# Patient Record
Sex: Male | Born: 1955 | Race: White | Hispanic: No | Marital: Married | Smoking: Current some day smoker
Health system: Southern US, Community
[De-identification: ages and names within clinical notes are randomized; demographics above are authoritative.]

## PROBLEM LIST (undated history)

## (undated) DIAGNOSIS — F419 Anxiety disorder, unspecified: Secondary | ICD-10-CM

## (undated) DIAGNOSIS — I480 Paroxysmal atrial fibrillation: Secondary | ICD-10-CM

## (undated) DIAGNOSIS — M1990 Unspecified osteoarthritis, unspecified site: Secondary | ICD-10-CM

## (undated) DIAGNOSIS — I639 Cerebral infarction, unspecified: Secondary | ICD-10-CM

## (undated) DIAGNOSIS — S129XXA Fracture of neck, unspecified, initial encounter: Secondary | ICD-10-CM

## (undated) DIAGNOSIS — F32A Depression, unspecified: Secondary | ICD-10-CM

## (undated) DIAGNOSIS — R55 Syncope and collapse: Secondary | ICD-10-CM

## (undated) DIAGNOSIS — F329 Major depressive disorder, single episode, unspecified: Secondary | ICD-10-CM

## (undated) DIAGNOSIS — J189 Pneumonia, unspecified organism: Secondary | ICD-10-CM

## (undated) HISTORY — PX: REPLACEMENT TOTAL KNEE: SUR1224

## (undated) HISTORY — DX: Fracture of neck, unspecified, initial encounter: S12.9XXA

## (undated) HISTORY — PX: NECK SURGERY: SHX720

## (undated) HISTORY — PX: EYE SURGERY: SHX253

---

## 1979-09-07 HISTORY — PX: KNEE SURGERY: SHX244

## 1999-04-16 ENCOUNTER — Encounter: Payer: Self-pay | Admitting: Neurosurgery

## 1999-04-17 ENCOUNTER — Encounter: Payer: Self-pay | Admitting: Neurosurgery

## 1999-04-17 ENCOUNTER — Inpatient Hospital Stay (HOSPITAL_COMMUNITY): Admission: RE | Admit: 1999-04-17 | Discharge: 1999-04-18 | Payer: Self-pay | Admitting: Neurosurgery

## 1999-12-26 ENCOUNTER — Encounter: Payer: Self-pay | Admitting: Emergency Medicine

## 1999-12-26 ENCOUNTER — Emergency Department (HOSPITAL_COMMUNITY): Admission: EM | Admit: 1999-12-26 | Discharge: 1999-12-27 | Payer: Self-pay | Admitting: Emergency Medicine

## 1999-12-27 ENCOUNTER — Encounter: Payer: Self-pay | Admitting: Family Medicine

## 1999-12-27 ENCOUNTER — Encounter: Payer: Self-pay | Admitting: Emergency Medicine

## 2000-12-26 ENCOUNTER — Emergency Department (HOSPITAL_COMMUNITY): Admission: EM | Admit: 2000-12-26 | Discharge: 2000-12-26 | Payer: Self-pay | Admitting: Emergency Medicine

## 2001-06-16 ENCOUNTER — Encounter: Payer: Self-pay | Admitting: Neurosurgery

## 2001-06-16 ENCOUNTER — Ambulatory Visit (HOSPITAL_COMMUNITY): Admission: RE | Admit: 2001-06-16 | Discharge: 2001-06-16 | Payer: Self-pay | Admitting: Neurosurgery

## 2001-07-25 ENCOUNTER — Encounter: Payer: Self-pay | Admitting: Neurosurgery

## 2001-07-25 ENCOUNTER — Encounter: Admission: RE | Admit: 2001-07-25 | Discharge: 2001-07-25 | Payer: Self-pay | Admitting: Neurosurgery

## 2001-08-15 ENCOUNTER — Ambulatory Visit (HOSPITAL_COMMUNITY): Admission: RE | Admit: 2001-08-15 | Discharge: 2001-08-15 | Payer: Self-pay | Admitting: Neurosurgery

## 2001-08-15 ENCOUNTER — Encounter: Payer: Self-pay | Admitting: Neurosurgery

## 2001-08-29 ENCOUNTER — Encounter: Payer: Self-pay | Admitting: Neurosurgery

## 2001-08-29 ENCOUNTER — Encounter: Admission: RE | Admit: 2001-08-29 | Discharge: 2001-08-29 | Payer: Self-pay | Admitting: Neurosurgery

## 2002-07-15 ENCOUNTER — Emergency Department (HOSPITAL_COMMUNITY): Admission: EM | Admit: 2002-07-15 | Discharge: 2002-07-15 | Payer: Self-pay | Admitting: Emergency Medicine

## 2002-09-06 HISTORY — PX: CERVICAL DISCECTOMY: SHX98

## 2002-10-19 ENCOUNTER — Emergency Department (HOSPITAL_COMMUNITY): Admission: EM | Admit: 2002-10-19 | Discharge: 2002-10-19 | Payer: Self-pay | Admitting: Emergency Medicine

## 2003-07-05 ENCOUNTER — Emergency Department (HOSPITAL_COMMUNITY): Admission: AD | Admit: 2003-07-05 | Discharge: 2003-07-05 | Payer: Self-pay | Admitting: Family Medicine

## 2003-09-25 ENCOUNTER — Emergency Department (HOSPITAL_COMMUNITY): Admission: AD | Admit: 2003-09-25 | Discharge: 2003-09-25 | Payer: Self-pay | Admitting: Family Medicine

## 2003-10-15 ENCOUNTER — Emergency Department (HOSPITAL_COMMUNITY): Admission: EM | Admit: 2003-10-15 | Discharge: 2003-10-15 | Payer: Self-pay | Admitting: Family Medicine

## 2004-12-02 ENCOUNTER — Emergency Department (HOSPITAL_COMMUNITY): Admission: EM | Admit: 2004-12-02 | Discharge: 2004-12-02 | Payer: Self-pay | Admitting: Emergency Medicine

## 2005-03-18 ENCOUNTER — Ambulatory Visit: Payer: Self-pay | Admitting: Internal Medicine

## 2005-09-16 ENCOUNTER — Ambulatory Visit: Payer: Self-pay | Admitting: Family Medicine

## 2006-02-08 ENCOUNTER — Ambulatory Visit: Payer: Self-pay | Admitting: Family Medicine

## 2006-04-18 ENCOUNTER — Ambulatory Visit: Payer: Self-pay | Admitting: Family Medicine

## 2006-05-16 ENCOUNTER — Emergency Department (HOSPITAL_COMMUNITY): Admission: EM | Admit: 2006-05-16 | Discharge: 2006-05-16 | Payer: Self-pay | Admitting: Family Medicine

## 2006-06-21 ENCOUNTER — Ambulatory Visit: Payer: Self-pay | Admitting: Family Medicine

## 2006-06-21 LAB — CONVERTED CEMR LAB
ALT: 23 units/L (ref 0–40)
AST: 21 units/L (ref 0–37)
Albumin: 4.2 g/dL (ref 3.5–5.2)
Alkaline Phosphatase: 86 units/L (ref 39–117)
BUN: 14 mg/dL (ref 6–23)
Basophils Absolute: 0.1 10*3/uL (ref 0.0–0.1)
Basophils Relative: 0.9 % (ref 0.0–1.0)
CO2: 27 meq/L (ref 19–32)
Calcium: 9.2 mg/dL (ref 8.4–10.5)
Chloride: 105 meq/L (ref 96–112)
Chol/HDL Ratio, serum: 5.3
Cholesterol: 211 mg/dL (ref 0–200)
Creatinine, Ser: 1.1 mg/dL (ref 0.4–1.5)
Eosinophil percent: 2.6 % (ref 0.0–5.0)
GFR calc non Af Amer: 75 mL/min
Glomerular Filtration Rate, Af Am: 91 mL/min/{1.73_m2}
Glucose, Bld: 88 mg/dL (ref 70–99)
HCT: 39.6 % (ref 39.0–52.0)
HDL: 39.5 mg/dL (ref >39.0)
Hemoglobin: 13.2 g/dL (ref 13.0–17.0)
LDL DIRECT: 157.1 mg/dL
Lymphocytes Relative: 24.1 % (ref 12.0–46.0)
MCHC: 33.3 g/dL (ref 30.0–36.0)
MCV: 86.1 fL (ref 78.0–100.0)
Monocytes Absolute: 0.6 10*3/uL (ref 0.2–0.7)
Monocytes Relative: 6.8 % (ref 3.0–11.0)
Neutro Abs: 5.2 10*3/uL (ref 1.4–7.7)
Neutrophils Relative %: 65.6 % (ref 43.0–77.0)
PSA: 1.65 ng/mL (ref 0.10–4.00)
Platelets: 376 10*3/uL (ref 150–400)
Potassium: 4.3 meq/L (ref 3.5–5.1)
RBC: 4.61 M/uL (ref 4.22–5.81)
RDW: 13.8 % (ref 11.5–14.6)
Sodium: 140 meq/L (ref 135–145)
TSH: 1.09 microintl units/mL (ref 0.35–5.50)
Total Bilirubin: 0.5 mg/dL (ref 0.3–1.2)
Total Protein: 7.1 g/dL (ref 6.0–8.3)
Triglyceride fasting, serum: 72 mg/dL (ref 0–149)
VLDL: 14 mg/dL (ref 0–40)
WBC: 8.1 10*3/uL (ref 4.5–10.5)

## 2006-10-14 ENCOUNTER — Ambulatory Visit: Payer: Self-pay | Admitting: Family Medicine

## 2006-10-17 ENCOUNTER — Ambulatory Visit: Payer: Self-pay | Admitting: Cardiology

## 2006-10-18 ENCOUNTER — Ambulatory Visit: Payer: Self-pay | Admitting: Family Medicine

## 2006-10-29 ENCOUNTER — Emergency Department (HOSPITAL_COMMUNITY): Admission: EM | Admit: 2006-10-29 | Discharge: 2006-10-30 | Payer: Self-pay | Admitting: Emergency Medicine

## 2006-11-03 ENCOUNTER — Ambulatory Visit: Payer: Self-pay | Admitting: Family Medicine

## 2007-02-07 ENCOUNTER — Emergency Department (HOSPITAL_COMMUNITY): Admission: EM | Admit: 2007-02-07 | Discharge: 2007-02-07 | Payer: Self-pay | Admitting: Emergency Medicine

## 2007-08-07 DIAGNOSIS — J069 Acute upper respiratory infection, unspecified: Secondary | ICD-10-CM | POA: Insufficient documentation

## 2007-08-08 ENCOUNTER — Ambulatory Visit: Payer: Self-pay | Admitting: Family Medicine

## 2007-08-08 DIAGNOSIS — F411 Generalized anxiety disorder: Secondary | ICD-10-CM | POA: Insufficient documentation

## 2007-09-12 ENCOUNTER — Emergency Department (HOSPITAL_COMMUNITY): Admission: EM | Admit: 2007-09-12 | Discharge: 2007-09-12 | Payer: Self-pay | Admitting: Emergency Medicine

## 2007-09-26 ENCOUNTER — Ambulatory Visit (HOSPITAL_COMMUNITY): Admission: RE | Admit: 2007-09-26 | Discharge: 2007-09-26 | Payer: Self-pay | Admitting: Orthopedic Surgery

## 2007-11-10 ENCOUNTER — Ambulatory Visit: Payer: Self-pay | Admitting: Family Medicine

## 2007-11-27 ENCOUNTER — Telehealth: Payer: Self-pay | Admitting: Family Medicine

## 2007-12-08 ENCOUNTER — Telehealth: Payer: Self-pay | Admitting: Family Medicine

## 2007-12-24 DIAGNOSIS — R209 Unspecified disturbances of skin sensation: Secondary | ICD-10-CM | POA: Insufficient documentation

## 2007-12-28 ENCOUNTER — Ambulatory Visit: Payer: Self-pay | Admitting: Family Medicine

## 2008-01-19 ENCOUNTER — Ambulatory Visit: Payer: Self-pay | Admitting: Family Medicine

## 2008-01-19 DIAGNOSIS — J019 Acute sinusitis, unspecified: Secondary | ICD-10-CM | POA: Insufficient documentation

## 2008-02-05 ENCOUNTER — Ambulatory Visit: Payer: Self-pay | Admitting: Family Medicine

## 2008-02-05 DIAGNOSIS — J45909 Unspecified asthma, uncomplicated: Secondary | ICD-10-CM | POA: Insufficient documentation

## 2008-02-10 ENCOUNTER — Emergency Department (HOSPITAL_COMMUNITY): Admission: EM | Admit: 2008-02-10 | Discharge: 2008-02-10 | Payer: Self-pay | Admitting: Family Medicine

## 2008-03-29 ENCOUNTER — Telehealth: Payer: Self-pay | Admitting: Internal Medicine

## 2008-04-18 ENCOUNTER — Telehealth: Payer: Self-pay | Admitting: Family Medicine

## 2008-05-10 ENCOUNTER — Ambulatory Visit: Payer: Self-pay | Admitting: Family Medicine

## 2008-05-20 ENCOUNTER — Ambulatory Visit: Payer: Self-pay | Admitting: Family Medicine

## 2008-05-20 LAB — CONVERTED CEMR LAB
ALT: 25 units/L (ref 0–53)
AST: 20 units/L (ref 0–37)
Albumin: 4.2 g/dL (ref 3.5–5.2)
Alkaline Phosphatase: 84 units/L (ref 39–117)
BUN: 17 mg/dL (ref 6–23)
Basophils Absolute: 0.1 10*3/uL (ref 0.0–0.1)
Basophils Relative: 1.1 % (ref 0.0–3.0)
Bilirubin Urine: NEGATIVE
Bilirubin, Direct: 0.1 mg/dL (ref 0.0–0.3)
CO2: 31 meq/L (ref 19–32)
Calcium: 9 mg/dL (ref 8.4–10.5)
Chloride: 110 meq/L (ref 96–112)
Cholesterol: 229 mg/dL (ref 0–200)
Creatinine, Ser: 1.2 mg/dL (ref 0.4–1.5)
Direct LDL: 180.9 mg/dL
Eosinophils Absolute: 0.2 10*3/uL (ref 0.0–0.7)
Eosinophils Relative: 3.1 % (ref 0.0–5.0)
GFR calc Af Amer: 82 mL/min
GFR calc non Af Amer: 68 mL/min
Glucose, Bld: 97 mg/dL (ref 70–99)
Glucose, Urine, Semiquant: NEGATIVE
HCT: 40.2 % (ref 39.0–52.0)
HDL: 33.6 mg/dL — ABNORMAL LOW (ref >39.0)
Hemoglobin: 14 g/dL (ref 13.0–17.0)
Ketones, urine, test strip: NEGATIVE
Lymphocytes Relative: 25.4 % (ref 12.0–46.0)
MCHC: 34.7 g/dL (ref 30.0–36.0)
MCV: 90.3 fL (ref 78.0–100.0)
Monocytes Absolute: 0.5 10*3/uL (ref 0.1–1.0)
Monocytes Relative: 7.9 % (ref 3.0–12.0)
Neutro Abs: 3.8 10*3/uL (ref 1.4–7.7)
Neutrophils Relative %: 62.5 % (ref 43.0–77.0)
Nitrite: NEGATIVE
PSA: 1.26 ng/mL (ref 0.10–4.00)
Platelets: 290 10*3/uL (ref 150–400)
Potassium: 4.5 meq/L (ref 3.5–5.1)
Protein, U semiquant: NEGATIVE
RBC: 4.46 M/uL (ref 4.22–5.81)
RDW: 13.2 % (ref 11.5–14.6)
Sodium: 144 meq/L (ref 135–145)
Specific Gravity, Urine: 1.02
TSH: 0.8 microintl units/mL (ref 0.35–5.50)
Total Bilirubin: 0.8 mg/dL (ref 0.3–1.2)
Total CHOL/HDL Ratio: 6.8
Total Protein: 7 g/dL (ref 6.0–8.3)
Triglycerides: 142 mg/dL (ref 0–149)
Urobilinogen, UA: 0.2
VLDL: 28 mg/dL (ref 0–40)
WBC Urine, dipstick: NEGATIVE
WBC: 6.1 10*3/uL (ref 4.5–10.5)
pH: 6

## 2008-05-23 ENCOUNTER — Encounter: Payer: Self-pay | Admitting: Family Medicine

## 2008-06-23 ENCOUNTER — Emergency Department (HOSPITAL_COMMUNITY): Admission: EM | Admit: 2008-06-23 | Discharge: 2008-06-23 | Payer: Self-pay | Admitting: Emergency Medicine

## 2008-07-26 ENCOUNTER — Ambulatory Visit: Payer: Self-pay | Admitting: Family Medicine

## 2008-07-26 DIAGNOSIS — S335XXA Sprain of ligaments of lumbar spine, initial encounter: Secondary | ICD-10-CM | POA: Insufficient documentation

## 2008-08-05 ENCOUNTER — Emergency Department (HOSPITAL_COMMUNITY): Admission: EM | Admit: 2008-08-05 | Discharge: 2008-08-05 | Payer: Self-pay | Admitting: Family Medicine

## 2008-08-23 ENCOUNTER — Inpatient Hospital Stay (HOSPITAL_COMMUNITY): Admission: RE | Admit: 2008-08-23 | Discharge: 2008-08-27 | Payer: Self-pay | Admitting: Specialist

## 2008-09-29 ENCOUNTER — Emergency Department (HOSPITAL_COMMUNITY): Admission: EM | Admit: 2008-09-29 | Discharge: 2008-09-29 | Payer: Self-pay | Admitting: Emergency Medicine

## 2008-10-10 ENCOUNTER — Ambulatory Visit (HOSPITAL_COMMUNITY): Admission: RE | Admit: 2008-10-10 | Discharge: 2008-10-10 | Payer: Self-pay | Admitting: Specialist

## 2008-10-11 ENCOUNTER — Encounter: Admission: RE | Admit: 2008-10-11 | Discharge: 2008-11-04 | Payer: Self-pay | Admitting: Orthopedic Surgery

## 2008-10-15 ENCOUNTER — Emergency Department (HOSPITAL_COMMUNITY): Admission: EM | Admit: 2008-10-15 | Discharge: 2008-10-15 | Payer: Self-pay | Admitting: Family Medicine

## 2009-01-17 ENCOUNTER — Emergency Department (HOSPITAL_COMMUNITY): Admission: EM | Admit: 2009-01-17 | Discharge: 2009-01-17 | Payer: Self-pay | Admitting: Emergency Medicine

## 2009-05-23 ENCOUNTER — Ambulatory Visit: Payer: Self-pay | Admitting: Internal Medicine

## 2009-05-23 DIAGNOSIS — R519 Headache, unspecified: Secondary | ICD-10-CM | POA: Insufficient documentation

## 2009-05-23 DIAGNOSIS — R51 Headache: Secondary | ICD-10-CM | POA: Insufficient documentation

## 2009-06-12 ENCOUNTER — Telehealth: Payer: Self-pay | Admitting: Family Medicine

## 2009-07-23 ENCOUNTER — Emergency Department (HOSPITAL_COMMUNITY): Admission: EM | Admit: 2009-07-23 | Discharge: 2009-07-23 | Payer: Self-pay | Admitting: Family Medicine

## 2009-08-14 ENCOUNTER — Telehealth: Payer: Self-pay | Admitting: Family Medicine

## 2009-08-16 ENCOUNTER — Emergency Department (HOSPITAL_COMMUNITY): Admission: EM | Admit: 2009-08-16 | Discharge: 2009-08-16 | Payer: Self-pay | Admitting: Family Medicine

## 2009-09-12 ENCOUNTER — Ambulatory Visit: Payer: Self-pay | Admitting: Family Medicine

## 2009-09-12 ENCOUNTER — Telehealth: Payer: Self-pay | Admitting: Family Medicine

## 2009-09-12 DIAGNOSIS — M25569 Pain in unspecified knee: Secondary | ICD-10-CM | POA: Insufficient documentation

## 2009-09-15 ENCOUNTER — Telehealth: Payer: Self-pay | Admitting: Family Medicine

## 2009-10-23 ENCOUNTER — Telehealth: Payer: Self-pay | Admitting: Family Medicine

## 2009-10-31 ENCOUNTER — Ambulatory Visit: Payer: Self-pay | Admitting: Family Medicine

## 2009-10-31 LAB — CONVERTED CEMR LAB
ALT: 22 units/L (ref 0–53)
AST: 19 units/L (ref 0–37)
Albumin: 4 g/dL (ref 3.5–5.2)
Alkaline Phosphatase: 78 units/L (ref 39–117)
BUN: 16 mg/dL (ref 6–23)
Basophils Absolute: 0.1 10*3/uL (ref 0.0–0.1)
Basophils Relative: 0.8 % (ref 0.0–3.0)
Bilirubin Urine: NEGATIVE
Bilirubin, Direct: 0.1 mg/dL (ref 0.0–0.3)
Blood in Urine, dipstick: NEGATIVE
CO2: 31 meq/L (ref 19–32)
Calcium: 9.2 mg/dL (ref 8.4–10.5)
Chloride: 110 meq/L (ref 96–112)
Cholesterol: 226 mg/dL — ABNORMAL HIGH (ref 0–200)
Creatinine, Ser: 1.1 mg/dL (ref 0.4–1.5)
Direct LDL: 161.7 mg/dL
Eosinophils Absolute: 0.3 10*3/uL (ref 0.0–0.7)
Eosinophils Relative: 4 % (ref 0.0–5.0)
GFR calc non Af Amer: 74.14 mL/min (ref >60)
Glucose, Bld: 92 mg/dL (ref 70–99)
Glucose, Urine, Semiquant: NEGATIVE
HCT: 41.7 % (ref 39.0–52.0)
HDL: 51.4 mg/dL (ref >39.00)
Hemoglobin: 13.7 g/dL (ref 13.0–17.0)
Ketones, urine, test strip: NEGATIVE
Lymphocytes Relative: 26.6 % (ref 12.0–46.0)
Lymphs Abs: 1.8 10*3/uL (ref 0.7–4.0)
MCHC: 32.9 g/dL (ref 30.0–36.0)
MCV: 92.6 fL (ref 78.0–100.0)
Monocytes Absolute: 0.4 10*3/uL (ref 0.1–1.0)
Monocytes Relative: 6.2 % (ref 3.0–12.0)
Neutro Abs: 4.2 10*3/uL (ref 1.4–7.7)
Neutrophils Relative %: 62.4 % (ref 43.0–77.0)
Nitrite: NEGATIVE
PSA: 1.29 ng/mL (ref 0.10–4.00)
Platelets: 265 10*3/uL (ref 150.0–400.0)
Potassium: 4.5 meq/L (ref 3.5–5.1)
Protein, U semiquant: NEGATIVE
RBC: 4.5 M/uL (ref 4.22–5.81)
RDW: 12.8 % (ref 11.5–14.6)
Sodium: 144 meq/L (ref 135–145)
Specific Gravity, Urine: 1.02
TSH: 1.03 microintl units/mL (ref 0.35–5.50)
Total Bilirubin: 0.3 mg/dL (ref 0.3–1.2)
Total CHOL/HDL Ratio: 4
Total Protein: 6.9 g/dL (ref 6.0–8.3)
Triglycerides: 124 mg/dL (ref 0.0–149.0)
Urobilinogen, UA: 0.2
VLDL: 24.8 mg/dL (ref 0.0–40.0)
WBC Urine, dipstick: NEGATIVE
WBC: 6.8 10*3/uL (ref 4.5–10.5)
pH: 7

## 2009-11-07 ENCOUNTER — Ambulatory Visit: Payer: Self-pay | Admitting: Family Medicine

## 2009-11-07 DIAGNOSIS — J309 Allergic rhinitis, unspecified: Secondary | ICD-10-CM | POA: Insufficient documentation

## 2009-12-03 ENCOUNTER — Ambulatory Visit: Payer: Self-pay | Admitting: Family Medicine

## 2009-12-10 ENCOUNTER — Encounter (INDEPENDENT_AMBULATORY_CARE_PROVIDER_SITE_OTHER): Payer: Self-pay | Admitting: *Deleted

## 2009-12-12 ENCOUNTER — Encounter (INDEPENDENT_AMBULATORY_CARE_PROVIDER_SITE_OTHER): Payer: Self-pay | Admitting: *Deleted

## 2009-12-17 ENCOUNTER — Ambulatory Visit: Payer: Self-pay | Admitting: Gastroenterology

## 2009-12-29 ENCOUNTER — Telehealth: Payer: Self-pay | Admitting: Gastroenterology

## 2010-01-02 ENCOUNTER — Telehealth: Payer: Self-pay | Admitting: Family Medicine

## 2010-01-26 ENCOUNTER — Inpatient Hospital Stay (HOSPITAL_COMMUNITY): Admission: EM | Admit: 2010-01-26 | Discharge: 2010-01-30 | Payer: Self-pay | Admitting: Emergency Medicine

## 2010-01-26 ENCOUNTER — Emergency Department (HOSPITAL_COMMUNITY): Admission: EM | Admit: 2010-01-26 | Discharge: 2010-01-26 | Payer: Self-pay | Admitting: Family Medicine

## 2010-05-18 ENCOUNTER — Telehealth: Payer: Self-pay | Admitting: Family Medicine

## 2010-10-05 ENCOUNTER — Telehealth: Payer: Self-pay | Admitting: Family Medicine

## 2010-10-08 ENCOUNTER — Inpatient Hospital Stay (INDEPENDENT_AMBULATORY_CARE_PROVIDER_SITE_OTHER)
Admission: RE | Admit: 2010-10-08 | Discharge: 2010-10-08 | Disposition: A | Discharge status: Home / Self Care | Payer: Commercial Managed Care - PPO | Source: Ambulatory Visit | Attending: Family Medicine | Admitting: Family Medicine

## 2010-10-08 DIAGNOSIS — T148XXA Other injury of unspecified body region, initial encounter: Secondary | ICD-10-CM

## 2010-10-08 NOTE — Miscellaneous (Signed)
Summary: [facility] previsit  Clinical Lists Changes  Medications: Added new medication of MOVIPREP 100 GM  SOLR (PEG-KCL-NACL-NASULF-NA ASC-C) As per prep instructions. - Signed Rx of MOVIPREP 100 GM  SOLR (PEG-KCL-NACL-NASULF-NA ASC-C) As per prep instructions.;  #1 x 0;  Signed;  Entered by: Karl Bales RN;  Authorized by: Mardella Layman MD [facility];  Method used: Electronically to [facility]*, [address] Shipping/mailing, [place], [place]  [address], Ph: [phone], Fax: [phone]    Prescriptions: MOVIPREP 100 GM  SOLR (PEG-KCL-NACL-NASULF-NA ASC-C) As per prep instructions.  #1 x 0   Entered by:   Karl Bales RN   Authorized by:   Mardella Layman MD [facility]   Signed by:   Karl Bales RN on 12/17/2009   Method used:   Electronically to        [facility]* (retail)       [address]       [address] Shipping/mailing       [place], [place]  [address]       Ph: [phone]       Fax: [phone]   RxID:   [id number]

## 2010-10-08 NOTE — Letter (Signed)
Summary: Previsit letter  [facility]  [address] [place], [place] [address]   Phone: [phone]  Fax: [phone]       12/10/2009 MRN: [id number]  Cristian Gonzales [address] [place], [place]  [address]  Dear Cristian Gonzales,  Welcome to the Gastroenterology Division at [facility].    You are scheduled to see a nurse for your pre-procedure visit on 12-17-09 at 4:30p.m. on the 3rd floor at [facility], [address]  We ask that you try to arrive at our office 15 minutes prior to your appointment time to allow for check-in.  Your nurse visit will consist of discussing your medical and surgical history, your immediate family medical history, and your medications.    Please bring a complete list of all your medications or, if you prefer, bring the medication bottles and we will list them.  We will need to be aware of both prescribed and over the counter drugs.  We will need to know exact dosage information as well.  If you are on blood thinners (Coumadin, Plavix, Aggrenox, Ticlid, etc.) please call our office today/prior to your appointment, as we need to consult with your physician about holding your medication.   Please be prepared to read and sign documents such as consent forms, a financial agreement, and acknowledgement forms.  If necessary, and with your consent, a friend or relative is welcome to sit-in on the nurse visit with you.  Please bring your insurance card so that we may make a copy of it.  If your insurance requires a referral to see a specialist, please bring your referral form from your primary care physician.  No co-pay is required for this nurse visit.     If you cannot keep your appointment, please call [phone] to cancel or reschedule prior to your appointment date.  This allows [place] the opportunity to schedule an appointment for another patient in need of care.    Thank you for choosing [facility] for your medical needs.   We appreciate the opportunity to care for you.  Please visit [place] at our website  to learn more about our practice.                     Sincerely.                                                                                                                   The Gastroenterology Division
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
## 2010-10-08 NOTE — Progress Notes (Signed)
Summary: lorazepam refill  Phone Note From Pharmacy   Summary of Call: patient is requesting a refill of lorazepam is this okay to  fill? Initial call taken by: Kern Reap CMA Duncan Dull),  September 15, 2009 10:38 AM  Follow-up for Phone Call        30 day supply given December, the ninth.  Please call how often as he taking them and as he sat up.  His physical exam ?????????? Follow-up by: Roderick Pee MD,  September 15, 2009 10:57 AM  Additional Follow-up for Phone Call Additional follow up Details #1::        left message on machine for patient to return our call Additional Follow-up by: Kern Reap CMA Duncan Dull),  September 15, 2009 3:00 PM    Additional Follow-up for Phone Call Additional follow up Details #2::    pt states that he is taking two times a day. pt states he is out of meds. 60 pills per month. pt was here last friday about his knee. Pt refuses to make an appt for a cpx. Follow-up by: Warnell Forester,  September 17, 2009 3:40 PM  Prescriptions: LORAZEPAM 1 MG  TABS (LORAZEPAM) Take 1 tablet by mouth two times a day as needed  #60 x 0   Entered by:   Lynann Beaver CMA   Authorized by:   Roderick Pee MD   Signed by:   Lynann Beaver CMA on 09/19/2009   Method used:   Telephoned to ...       [facility] [phone]* (retail)       [address]       [address]       [place], [place]  [address]       Ph: [phone]       Fax: [phone]   RxID:   [id number]

## 2010-10-08 NOTE — Progress Notes (Signed)
Summary: Rx Refill  Phone Note Refill Request Call back at Work Phone [phone] Message from:  Patient  Refills Requested: Medication #1:  LORAZEPAM 1 MG  TABS Take 1 tablet by mouth two times a day as needed Initial call taken by: Trixie Dredge,  May 18, 2010 12:34 PM  Follow-up for Phone Call         lorazepam, 1 mg dispense 60 tabs directions one p.o. b.i.d. p.r.n. refills x 3 Follow-up by: Roderick Pee MD,  May 18, 2010 2:03 PM    Prescriptions: LORAZEPAM 1 MG  TABS (LORAZEPAM) Take 1 tablet by mouth two times a day as needed  #60 x 3   Entered by:   Kern Reap CMA (AAMA)   Authorized by:   Roderick Pee MD   Signed by:   Kern Reap CMA (AAMA) on 05/18/2010   Method used:   Telephoned to ...       [facility] [phone]* (retail)       [address]       [address]       [place], [place]  [address]       Ph: [phone]       Fax: [phone]   RxID:   [id number]
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
## 2010-10-08 NOTE — Progress Notes (Signed)
Summary: lab results  Phone Note Call from Patient   Caller: Patient Call For: Roderick Pee MD Summary of Call: Pt states he was told by the neurologist to call Dr. Tawanna Cooler re: abnormal labs, and records should be here. [phone] Initial call taken by: Lynann Beaver CMA,  January 02, 2010 2:43 PM  Follow-up for Phone Call        please call all labs that were drawn by the neurologist, will be reported to the patient by the neurologist.  Have him call his neurologist to get his reports Follow-up by: Roderick Pee MD,  Jan 05, 2010 7:42 AM  Additional Follow-up for Phone Call Additional follow up Details #1::        left message on machine for patient  Additional Follow-up by: Kern Reap CMA Duncan Dull),  Jan 07, 2010 11:49 AM

## 2010-10-08 NOTE — Letter (Signed)
Summary: [facility] Instructions  [facility]  [address] [place], [place] [address]   Phone: [phone]  Fax: [phone]       Cristian Gonzales    [birth year]    MRN: [id number]        Procedure Day Dorna Bloom:  [facility]  12/31/09     Arrival Time:  10:30AM     Procedure Time:  11:30AM     Location of Procedure:                    _X _  [facility] (4th Floor)                        PREPARATION FOR COLONOSCOPY WITH MOVIPREP   Starting 5 days prior to your procedure 12/26/09 do not eat nuts, seeds, popcorn, corn, beans, peas,  salads, or any raw vegetables.  Do not take any fiber supplements (e.g. Metamucil, Citrucel, and Benefiber).  THE DAY BEFORE YOUR PROCEDURE         DATE: 12/30/09  DAY: TUESDAY  1.  Drink clear liquids the entire day-NO SOLID FOOD  2.  Do not drink anything colored red or purple.  Avoid juices with pulp.  No orange juice.  3.  Drink at least 64 oz. (8 glasses) of fluid/clear liquids during the day to prevent dehydration and help the prep work efficiently.  CLEAR LIQUIDS INCLUDE: Water Jello Ice Popsicles Tea (sugar ok, no milk/cream) Powdered fruit flavored drinks Coffee (sugar ok, no milk/cream) Gatorade Juice: apple, white grape, white cranberry  Lemonade Clear bullion, consomm, broth Carbonated beverages (any kind) Strained chicken noodle soup Hard Candy                             4.  In the morning, mix first dose of MoviPrep solution:    Empty 1 Pouch A and 1 Pouch B into the disposable container    Add lukewarm drinking water to the top line of the container. Mix to dissolve    Refrigerate (mixed solution should be used within 24 hrs)  5.  Begin drinking the prep at 5:00 p.m. The MoviPrep container is divided by 4 marks.   Every 15 minutes drink the solution down to the next mark (approximately 8 oz) until the full liter is complete.   6.  Follow completed prep with 16 oz of clear liquid of your choice  (Nothing red or purple).  Continue to drink clear liquids until bedtime.  7.  Before going to bed, mix second dose of MoviPrep solution:    Empty 1 Pouch A and 1 Pouch B into the disposable container    Add lukewarm drinking water to the top line of the container. Mix to dissolve    Refrigerate  THE DAY OF YOUR PROCEDURE      DATE: 12/31/09  DAY: WEDNESDAY  Beginning at 6:30AM (5 hours before procedure):         1. Every 15 minutes, drink the solution down to the next mark (approx 8 oz) until the full liter is complete.  2. Follow completed prep with 16 oz. of clear liquid of your choice.    3. You may drink clear liquids until 9:30AM (2 HOURS BEFORE PROCEDURE).   MEDICATION INSTRUCTIONS  Unless otherwise instructed, you should take regular prescription medications with a small sip of water   as early as possible the morning  of your procedure.         OTHER INSTRUCTIONS  You will need a responsible adult at least 55 years of age to accompany you and drive you home.   This person must remain in the waiting room during your procedure.  Wear loose fitting clothing that is easily removed.  Leave jewelry and other valuables at home.  However, you may wish to bring a book to read or  an iPod/MP3 player to listen to music as you wait for your procedure to start.  Remove all body piercing jewelry and leave at home.  Total time from sign-in until discharge is approximately 2-3 hours.  You should go home directly after your procedure and rest.  You can resume normal activities the  day after your procedure.  The day of your procedure you should not:   Drive   Make legal decisions   Operate machinery   Drink alcohol   Return to work  You will receive specific instructions about eating, activities and medications before you leave.    The above instructions have been reviewed and explained to me by  Karl Bales RN  December 17, 2009 4:52 PM      I fully  understand and can verbalize these instructions _____________________________ Date _________

## 2010-10-08 NOTE — Progress Notes (Signed)
Summary: MED REFILL  Phone Note Call from Patient Call back at Home Phone [phone]   Caller: Patient Call For: Roderick Pee MD Summary of Call: PT NEEDS REFILL ON  LORAZEPAM CALL INTO [facility] [phone]. PT HAS CPX [facility] FOR 11-07-2009 Initial call taken by: Heron Sabins,  October 23, 2009 10:39 AM  Follow-up for Phone Call        ok........Marland Kitchen60 tablets directions one p.o. b.i.d. no refills Follow-up by: Roderick Pee MD,  October 23, 2009 1:16 PM    Prescriptions: LORAZEPAM 1 MG  TABS (LORAZEPAM) Take 1 tablet by mouth two times a day as needed  #60 x 0   Entered by:   Kern Reap CMA (AAMA)   Authorized by:   Roderick Pee MD   Signed by:   Kern Reap CMA (AAMA) on 10/23/2009   Method used:   Telephoned to ...       [facility] [phone]* (retail)       [address]       [address]       [place], [place]  [address]       Ph: [phone]       Fax: [phone]   RxID:   [id number]

## 2010-10-08 NOTE — Progress Notes (Signed)
Summary: Canceled colonoscopy  Phone Note Call from Patient   Caller: Patient Call For: Dr. Jarold Motto Summary of Call: Pt. canceled his colonoscopy for 12-31-09 b/c he has to attend a funeral. Would you like this pt. charged the cancelation fee? Initial call taken by: Karna Christmas,  December 29, 2009 9:24 AM    Additional Follow-up for Phone Call Additional follow up Details #2::    no Follow-up by: Mardella Layman MD Clementeen Graham,  December 29, 2009 11:33 AM  Additional Follow-up for Phone Call Additional follow up Details #3:: Details for Additional Follow-up Action Taken: Patient NOT BILLED. Additional Follow-up by: Leanor Kail [facility],  Jan 13, 2010 1:30 PM
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
## 2010-10-08 NOTE — Progress Notes (Signed)
  Phone Note Outgoing Call   Summary of Call: I called Cristian Gonzales to let him know that his x-ray was normal.  Advise symptomatic therapy, orthopedic evaluation if pain gets worse, swelling, etc. Initial call taken by: Roderick Pee MD,  September 12, 2009 1:37 PM

## 2010-10-09 ENCOUNTER — Ambulatory Visit (INDEPENDENT_AMBULATORY_CARE_PROVIDER_SITE_OTHER): Payer: Commercial Managed Care - PPO | Admitting: Family Medicine

## 2010-10-09 ENCOUNTER — Encounter: Payer: Self-pay | Admitting: Family Medicine

## 2010-10-09 DIAGNOSIS — M25519 Pain in unspecified shoulder: Secondary | ICD-10-CM

## 2010-10-09 DIAGNOSIS — M25512 Pain in left shoulder: Secondary | ICD-10-CM

## 2010-10-09 DIAGNOSIS — F411 Generalized anxiety disorder: Secondary | ICD-10-CM

## 2010-10-09 MED ORDER — LORAZEPAM 1 MG PO TABS: 1.0000 mg | ORAL_TABLET | Freq: Two times a day (BID) | ORAL | Status: DC

## 2010-10-09 MED ORDER — HYDROCODONE-ACETAMINOPHEN 7.5-750 MG PO TABS: 1.0000 | ORAL_TABLET | Freq: Every evening | ORAL | Status: DC | PRN

## 2010-10-09 NOTE — Progress Notes (Signed)
  Subjective:    Patient ID: Cristian Gonzales, male    DOB: 13-Jun-1956, 55 y.o.   MRN: [id number]  HPI  Brett Canales is a 55 year old, male, nonsmoker, who comes in today for refill of this medication and to about a week.  A new problem left shoulder pain.  He is on Celexa 20 mg Monday, Wednesday, Friday.  He can't take it everyday, it makes him too sleepy.  He also takes lorazepam twice daily.  About two to 3 months ago he began having discomfort in his left shoulder.  Now is so painful he can't sleep at night.  He's been taking Motrin to no avail.  20 years ago he dislocated the shoulder.  Review of Systems Negative   Objective:   Physical Exam    In general, he is a well-developed, well-nourished, male in no acute distress.  Examination of the right shoulder is normal.  Examination of the left shoulder.  It appears normal.  He is not able to abduct his shoulder.   Assessment & Plan:  Depression,,,,,,,,,, continue the Celexa, Monday, Wednesday, Friday, and the lorazepam b.i.d  Left shoulder pain,,,,,,,,,, see Dr. Norlene Campbell, orthopedist.  I think you have a rotator cuff injury.  Take Motrin 600 mg twice daily with food.  Vicodin one half to one tablet at bedtime as needed for severe pain

## 2010-10-09 NOTE — Patient Instructions (Signed)
Call Dr. Norlene Campbell,,,,,, [phone],,,,,,, and make an appointment with him ASAP.  I think you have a rotator cuff injury.  Tear, left shoulder.  Take Motrin 600 mg twice daily with food, and one half or a full Vicodin at bedtime as needed for severe pain.  Also ice helps decrease the pain.  Continue the Celexa 20 mg 3 times weekly, and the Ativan twice a day as your currently doing

## 2010-10-14 NOTE — Progress Notes (Signed)
Summary: REQUEST FOR REFILL  Phone Note Refill Request Message from:  Patient on October 05, 2010 11:46 AM  Refills Requested: Medication #1:  LORAZEPAM 1 MG  TABS Take 1 tablet by mouth two times a day as needed   Notes: [facility].    Initial call taken by: Debbra Riding,  October 05, 2010 11:47 AM  Follow-up for Phone Call        #60 rf x 1 Follow-up by: Roderick Pee MD,  October 05, 2010 4:59 PM    Prescriptions: LORAZEPAM 1 MG  TABS (LORAZEPAM) Take 1 tablet by mouth two times a day as needed  #60 x 1   Entered by:   Kern Reap CMA (AAMA)   Authorized by:   Roderick Pee MD   Signed by:   Kern Reap CMA (AAMA) on 10/06/2010   Method used:   Telephoned to ...       [facility]* (retail)       [address]       [address]/mailing       [place], [place]  [address]       Ph: [phone]       Fax: [phone]   RxID:   [id number]

## 2010-11-09 ENCOUNTER — Ambulatory Visit: Payer: 59 | Attending: Specialist | Admitting: Physical Therapy

## 2010-11-09 DIAGNOSIS — IMO0001 Reserved for inherently not codable concepts without codable children: Secondary | ICD-10-CM | POA: Insufficient documentation

## 2010-11-09 DIAGNOSIS — M25569 Pain in unspecified knee: Secondary | ICD-10-CM | POA: Insufficient documentation

## 2010-11-09 DIAGNOSIS — M25669 Stiffness of unspecified knee, not elsewhere classified: Secondary | ICD-10-CM | POA: Insufficient documentation

## 2010-11-09 DIAGNOSIS — R269 Unspecified abnormalities of gait and mobility: Secondary | ICD-10-CM | POA: Insufficient documentation

## 2010-11-10 ENCOUNTER — Inpatient Hospital Stay (INDEPENDENT_AMBULATORY_CARE_PROVIDER_SITE_OTHER)
Admission: RE | Admit: 2010-11-10 | Discharge: 2010-11-10 | Disposition: A | Discharge status: Home / Self Care | Payer: 59 | Source: Ambulatory Visit

## 2010-11-10 DIAGNOSIS — R05 Cough: Secondary | ICD-10-CM

## 2010-11-10 DIAGNOSIS — R42 Dizziness and giddiness: Secondary | ICD-10-CM

## 2010-11-10 DIAGNOSIS — J069 Acute upper respiratory infection, unspecified: Secondary | ICD-10-CM

## 2010-11-10 DIAGNOSIS — R059 Cough, unspecified: Secondary | ICD-10-CM

## 2010-11-11 LAB — POCT I-STAT, CHEM 8
BUN: 23 mg/dL (ref 6–23)
Calcium, Ion: 1.17 mmol/L (ref 1.12–1.32)
Chloride: 105 mEq/L (ref 96–112)
Creatinine, Ser: 1.2 mg/dL (ref 0.4–1.5)
Glucose, Bld: 90 mg/dL (ref 70–99)
HCT: 47 % (ref 39.0–52.0)
Hemoglobin: 16 g/dL (ref 13.0–17.0)
Potassium: 4.3 mEq/L (ref 3.5–5.1)
Sodium: 141 mEq/L (ref 135–145)
TCO2: 28 mmol/L (ref 0–100)

## 2010-11-12 ENCOUNTER — Ambulatory Visit: Payer: 59 | Admitting: Rehabilitative and Restorative Service Providers"

## 2010-11-16 ENCOUNTER — Ambulatory Visit: Payer: 59 | Admitting: Physical Therapy

## 2010-11-18 ENCOUNTER — Ambulatory Visit: Payer: 59 | Admitting: Physical Therapy

## 2010-11-23 ENCOUNTER — Ambulatory Visit (INDEPENDENT_AMBULATORY_CARE_PROVIDER_SITE_OTHER): Payer: 59 | Admitting: Family Medicine

## 2010-11-23 ENCOUNTER — Encounter: Payer: Self-pay | Admitting: Family Medicine

## 2010-11-23 ENCOUNTER — Encounter: Payer: 59 | Admitting: Physical Therapy

## 2010-11-23 DIAGNOSIS — N451 Epididymitis: Secondary | ICD-10-CM | POA: Insufficient documentation

## 2010-11-23 DIAGNOSIS — M25512 Pain in left shoulder: Secondary | ICD-10-CM

## 2010-11-23 DIAGNOSIS — N453 Epididymo-orchitis: Secondary | ICD-10-CM

## 2010-11-23 LAB — DIFFERENTIAL
Basophils Absolute: 0 10*3/uL (ref 0.0–0.1)
Basophils Relative: 0 % (ref 0–1)
Eosinophils Absolute: 0.1 10*3/uL (ref 0.0–0.7)
Eosinophils Absolute: 0.2 10*3/uL (ref 0.0–0.7)
Eosinophils Relative: 1 % (ref 0–5)
Lymphocytes Relative: 25 % (ref 12–46)
Lymphocytes Relative: 7 % — ABNORMAL LOW (ref 12–46)
Lymphs Abs: 0.9 10*3/uL (ref 0.7–4.0)
Lymphs Abs: 2 10*3/uL (ref 0.7–4.0)
Monocytes Absolute: 0.9 10*3/uL (ref 0.1–1.0)
Monocytes Relative: 11 % (ref 3–12)
Monocytes Relative: 8 % (ref 3–12)
Neutro Abs: 10.4 10*3/uL — ABNORMAL HIGH (ref 1.7–7.7)
Neutrophils Relative %: 61 % (ref 43–77)
Neutrophils Relative %: 84 % — ABNORMAL HIGH (ref 43–77)

## 2010-11-23 LAB — BASIC METABOLIC PANEL
BUN: 12 mg/dL (ref 6–23)
BUN: 18 mg/dL (ref 6–23)
CO2: 24 mEq/L (ref 19–32)
CO2: 28 mEq/L (ref 19–32)
Calcium: 8.4 mg/dL (ref 8.4–10.5)
Calcium: 8.9 mg/dL (ref 8.4–10.5)
Chloride: 100 mEq/L (ref 96–112)
Chloride: 105 mEq/L (ref 96–112)
Creatinine, Ser: 1.15 mg/dL (ref 0.4–1.5)
GFR calc Af Amer: >60 mL/min (ref >60)
GFR calc non Af Amer: >60 mL/min (ref >60)
Glucose, Bld: 173 mg/dL — ABNORMAL HIGH (ref 70–99)
Glucose, Bld: 94 mg/dL (ref 70–99)
Potassium: 3.6 mEq/L (ref 3.5–5.1)

## 2010-11-23 LAB — POCT I-STAT, CHEM 8
BUN: 16 mg/dL (ref 6–23)
Calcium, Ion: 1.04 mmol/L — ABNORMAL LOW (ref 1.12–1.32)
Chloride: 103 mEq/L (ref 96–112)
Creatinine, Ser: 1.1 mg/dL (ref 0.4–1.5)
Glucose, Bld: 94 mg/dL (ref 70–99)
HCT: 47 % (ref 39.0–52.0)
Hemoglobin: 16 g/dL (ref 13.0–17.0)
Potassium: 4.3 mEq/L (ref 3.5–5.1)
Sodium: 137 mEq/L (ref 135–145)
TCO2: 27 mmol/L (ref 0–100)

## 2010-11-23 LAB — EPSTEIN-BARR VIRUS VCA ANTIBODY PANEL
EBV NA IgG: 2.64 {ISR} — ABNORMAL HIGH
EBV VCA IgG: 0.98 {ISR} — ABNORMAL HIGH

## 2010-11-23 LAB — CBC
HCT: 35.1 % — ABNORMAL LOW (ref 39.0–52.0)
HCT: 38.2 % — ABNORMAL LOW (ref 39.0–52.0)
HCT: 43.1 % (ref 39.0–52.0)
Hemoglobin: 12.4 g/dL — ABNORMAL LOW (ref 13.0–17.0)
Hemoglobin: 13.3 g/dL (ref 13.0–17.0)
Hemoglobin: 14.8 g/dL (ref 13.0–17.0)
MCHC: 34.3 g/dL (ref 30.0–36.0)
MCHC: 34.7 g/dL (ref 30.0–36.0)
MCHC: 34.8 g/dL (ref 30.0–36.0)
MCV: 90.7 fL (ref 78.0–100.0)
MCV: 91.1 fL (ref 78.0–100.0)
MCV: 91.5 fL (ref 78.0–100.0)
Platelets: 195 10*3/uL (ref 150–400)
Platelets: 246 10*3/uL (ref 150–400)
Platelets: 262 10*3/uL (ref 150–400)
RBC: 3.84 MIL/uL — ABNORMAL LOW (ref 4.22–5.81)
RBC: 4.74 MIL/uL (ref 4.22–5.81)
RDW: 13.2 % (ref 11.5–15.5)
RDW: 13.6 % (ref 11.5–15.5)
RDW: 13.6 % (ref 11.5–15.5)
RDW: 14.1 % (ref 11.5–15.5)
WBC: 12.3 10*3/uL — ABNORMAL HIGH (ref 4.0–10.5)
WBC: 8.1 10*3/uL (ref 4.0–10.5)

## 2010-11-23 LAB — COMPREHENSIVE METABOLIC PANEL
ALT: 19 U/L (ref 0–53)
ALT: 21 U/L (ref 0–53)
AST: 19 U/L (ref 0–37)
AST: 20 U/L (ref 0–37)
Albumin: 3.4 g/dL — ABNORMAL LOW (ref 3.5–5.2)
Albumin: 3.8 g/dL (ref 3.5–5.2)
Alkaline Phosphatase: 68 U/L (ref 39–117)
Alkaline Phosphatase: 81 U/L (ref 39–117)
BUN: 15 mg/dL (ref 6–23)
CO2: 26 mEq/L (ref 19–32)
Calcium: 8.2 mg/dL — ABNORMAL LOW (ref 8.4–10.5)
Chloride: 106 mEq/L (ref 96–112)
Creatinine, Ser: 1.16 mg/dL (ref 0.4–1.5)
GFR calc Af Amer: >60 mL/min (ref >60)
GFR calc Af Amer: >60 mL/min (ref >60)
GFR calc non Af Amer: >60 mL/min (ref >60)
Glucose, Bld: 107 mg/dL — ABNORMAL HIGH (ref 70–99)
Glucose, Bld: 86 mg/dL (ref 70–99)
Potassium: 3.7 mEq/L (ref 3.5–5.1)
Potassium: 4.1 mEq/L (ref 3.5–5.1)
Sodium: 137 mEq/L (ref 135–145)
Sodium: 139 mEq/L (ref 135–145)
Total Bilirubin: 0.6 mg/dL (ref 0.3–1.2)
Total Protein: 6 g/dL (ref 6.0–8.3)
Total Protein: 6.7 g/dL (ref 6.0–8.3)

## 2010-11-23 LAB — CULTURE, BLOOD (ROUTINE X 2)

## 2010-11-23 LAB — URINALYSIS, ROUTINE W REFLEX MICROSCOPIC
Nitrite: NEGATIVE
Specific Gravity, Urine: 1.024 (ref 1.005–1.030)
Urobilinogen, UA: 0.2 mg/dL (ref 0.0–1.0)

## 2010-11-23 LAB — LACTIC ACID, PLASMA: Lactic Acid, Venous: 1.3 mmol/L (ref 0.5–2.2)

## 2010-11-23 LAB — GLUCOSE, CAPILLARY
Glucose-Capillary: 118 mg/dL — ABNORMAL HIGH (ref 70–99)
Glucose-Capillary: 180 mg/dL — ABNORMAL HIGH (ref 70–99)

## 2010-11-23 LAB — URINE CULTURE

## 2010-11-23 LAB — ROCKY MTN SPOTTED FVR AB, IGM-BLOOD: RMSF IgM: 0.06 IV (ref 0.00–0.89)

## 2010-11-23 LAB — ROCKY MTN SPOTTED FVR AB, IGG-BLOOD: RMSF IgG: 0.07 IV

## 2010-11-23 LAB — POCT RAPID STREP A (OFFICE): Streptococcus, Group A Screen (Direct): NEGATIVE

## 2010-11-23 LAB — MONONUCLEOSIS SCREEN: Mono Screen: NEGATIVE

## 2010-11-23 MED ORDER — CIPROFLOXACIN HCL 500 MG PO TABS: ORAL_TABLET | ORAL | Status: DC

## 2010-11-23 MED ORDER — HYDROCODONE-ACETAMINOPHEN 7.5-750 MG PO TABS: ORAL_TABLET | ORAL | Status: DC

## 2010-11-23 MED ORDER — HYDROCODONE-ACETAMINOPHEN 7.5-750 MG PO TABS: 1.0000 | ORAL_TABLET | Freq: Every evening | ORAL | Status: DC | PRN

## 2010-11-23 NOTE — Patient Instructions (Signed)
Rest at home.  Ice to the groin area.  Septra one twice daily.  Vicodin one every 4 hours as needed for pain follow-up on Wednesday.  If the pain becomes worse or he develops swelling.  Call the urology Center immediately

## 2010-11-23 NOTE — Progress Notes (Signed)
  Subjective:    Patient ID: Cristian Gonzales, male    DOB: 04/03/1956, 55 y.o.   MRN: [id number]  HPISteven is a 55 year old male, who comes in today for evaluation of pain in his left testicle x 3 days.  About two o'clock on Saturday afternoon he noticed sudden onset of pain and tenderness of his left testicle.  No fever, chills, no urinary tract symptoms.  He states the pain now is constant, sharp, a 10 on a scale of one to 10 review of systems otherwise negative    Review of Systems    Urologic review of systems in general, he is systems otherwise negative Objective:   Physical Exam     Well-developed well-nourished, male in no acute distress.  Examination of the right testicle is normal.  Examination of left testicle was normal.  He has a marble size cystic-like lesion is extremely tender   Assessment & Plan:  Acute epididymitis.........Marland Kitchen Rest at home Vicodin for pain Cipro b.i.d. Follow-up in two days

## 2010-11-25 ENCOUNTER — Encounter: Payer: Self-pay | Admitting: Family Medicine

## 2010-11-25 ENCOUNTER — Encounter: Payer: 59 | Admitting: Physical Therapy

## 2010-11-25 ENCOUNTER — Ambulatory Visit (INDEPENDENT_AMBULATORY_CARE_PROVIDER_SITE_OTHER): Payer: 59 | Admitting: Family Medicine

## 2010-11-25 DIAGNOSIS — N453 Epididymo-orchitis: Secondary | ICD-10-CM

## 2010-11-25 DIAGNOSIS — N451 Epididymitis: Secondary | ICD-10-CM

## 2010-11-25 NOTE — Progress Notes (Signed)
  Subjective:    Patient ID: Cristian Gonzales, male    DOB: 08/24/1956, 55 y.o.   MRN: [id number]  HPISteven is a 55 year old male, who comes back today for follow-up of acute epididymitis.  We saw him earlier in the week with acute epididymitis.  We start him on Vicodin every 4 hours because of severe pain and Cipro 500 b.i.d.  He hasn't taken any pain medicine because he states the pain is 75% improved.  He still feels a lump in the left upper scrotum    Review of Systems General and GU review of systems otherwise negative    Objective:   Physical Exam    Well-developed well-nourished, male in no acute distress.  Examination of the scrotum shows a persistent cyst above the left testicle with soft much less tender than previous    Assessment & Plan:  Acute epididymitis resolving with medication.  Plan finish Cipro return p.r.n.

## 2010-11-25 NOTE — Patient Instructions (Signed)
Finish her medications.  Return p.r.n.

## 2010-12-01 ENCOUNTER — Ambulatory Visit: Payer: 59 | Admitting: Physical Therapy

## 2010-12-02 ENCOUNTER — Ambulatory Visit: Payer: 59 | Admitting: Family Medicine

## 2010-12-03 ENCOUNTER — Ambulatory Visit: Payer: 59 | Admitting: Physical Therapy

## 2010-12-03 ENCOUNTER — Telehealth: Payer: Self-pay | Admitting: *Deleted

## 2010-12-03 DIAGNOSIS — M25512 Pain in left shoulder: Secondary | ICD-10-CM

## 2010-12-03 DIAGNOSIS — N451 Epididymitis: Secondary | ICD-10-CM

## 2010-12-03 NOTE — Telephone Encounter (Signed)
Pt was seen for a cyst on his left testicle.  He has finished antibiotics and pain med and he is still having pain.  He said Dr Tawanna Cooler mentioned seeing a surgeon if it persist.  Please advise next step

## 2010-12-03 NOTE — Telephone Encounter (Signed)
Left message for patient to return our call.

## 2010-12-03 NOTE — Telephone Encounter (Signed)
Have him call the urology Center today and make an appointment ASAP

## 2010-12-04 ENCOUNTER — Ambulatory Visit: Payer: 59 | Admitting: Internal Medicine

## 2010-12-04 MED ORDER — HYDROCODONE-ACETAMINOPHEN 7.5-750 MG PO TABS: 1.0000 | ORAL_TABLET | Freq: Every evening | ORAL | Status: DC | PRN

## 2010-12-04 NOTE — Telephone Encounter (Signed)
I spoke with patient and he would like to know if he should continue his ATB, can he have a refill of his vicodin until he can get in with the urologist, and or should he come in for an office visit?

## 2010-12-04 NOTE — Telephone Encounter (Signed)
Left message for patient to return our call.

## 2010-12-04 NOTE — Telephone Encounter (Signed)
[facility] patient a urology appointment today and they can refill antibiotics and pain medicine if indicated. We'll be glad to fill pain medicines today if he cannot see urology

## 2010-12-04 NOTE — Telephone Encounter (Signed)
Spoke with patient and refill sent 

## 2010-12-04 NOTE — Telephone Encounter (Signed)
Addended by: Kern Reap on: 12/04/2010 01:01 PM   Modules accepted: Orders

## 2010-12-07 ENCOUNTER — Ambulatory Visit: Payer: 59 | Attending: Specialist

## 2010-12-07 DIAGNOSIS — Z96659 Presence of unspecified artificial knee joint: Secondary | ICD-10-CM | POA: Insufficient documentation

## 2010-12-07 DIAGNOSIS — M25669 Stiffness of unspecified knee, not elsewhere classified: Secondary | ICD-10-CM | POA: Insufficient documentation

## 2010-12-07 DIAGNOSIS — M25569 Pain in unspecified knee: Secondary | ICD-10-CM | POA: Insufficient documentation

## 2010-12-07 DIAGNOSIS — R269 Unspecified abnormalities of gait and mobility: Secondary | ICD-10-CM | POA: Insufficient documentation

## 2010-12-07 DIAGNOSIS — IMO0001 Reserved for inherently not codable concepts without codable children: Secondary | ICD-10-CM | POA: Insufficient documentation

## 2010-12-09 ENCOUNTER — Ambulatory Visit: Payer: 59

## 2010-12-15 ENCOUNTER — Ambulatory Visit: Payer: 59 | Admitting: Physical Therapy

## 2010-12-15 LAB — DIFFERENTIAL
Basophils Absolute: 0.1 10*3/uL (ref 0.0–0.1)
Basophils Relative: 2 % — ABNORMAL HIGH (ref 0–1)
Eosinophils Absolute: 0.2 10*3/uL (ref 0.0–0.7)
Monocytes Relative: 7 % (ref 3–12)
Neutro Abs: 3.9 10*3/uL (ref 1.7–7.7)
Neutrophils Relative %: 56 % (ref 43–77)

## 2010-12-15 LAB — POCT I-STAT, CHEM 8
BUN: 19 mg/dL (ref 6–23)
Calcium, Ion: 1.13 mmol/L (ref 1.12–1.32)
Creatinine, Ser: 1 mg/dL (ref 0.4–1.5)
Glucose, Bld: 96 mg/dL (ref 70–99)
Hemoglobin: 13.9 g/dL (ref 13.0–17.0)
Sodium: 140 mEq/L (ref 135–145)
TCO2: 25 mmol/L (ref 0–100)

## 2010-12-15 LAB — CBC
MCHC: 34.2 g/dL (ref 30.0–36.0)
MCV: 87.8 fL (ref 78.0–100.0)
Platelets: 301 10*3/uL (ref 150–400)
RBC: 4.47 MIL/uL (ref 4.22–5.81)

## 2010-12-15 LAB — POCT CARDIAC MARKERS: Myoglobin, poc: 39 ng/mL (ref 12–200)

## 2010-12-16 ENCOUNTER — Ambulatory Visit (INDEPENDENT_AMBULATORY_CARE_PROVIDER_SITE_OTHER): Payer: 59 | Admitting: Family Medicine

## 2010-12-16 ENCOUNTER — Encounter: Payer: Self-pay | Admitting: Family Medicine

## 2010-12-16 DIAGNOSIS — R1110 Vomiting, unspecified: Secondary | ICD-10-CM

## 2010-12-16 DIAGNOSIS — A088 Other specified intestinal infections: Secondary | ICD-10-CM

## 2010-12-16 DIAGNOSIS — A09 Infectious gastroenteritis and colitis, unspecified: Secondary | ICD-10-CM

## 2010-12-16 MED ORDER — ONDANSETRON HCL 4 MG PO TABS: ORAL_TABLET | ORAL | Status: DC

## 2010-12-16 MED ORDER — PROMETHAZINE HCL 50 MG/ML IJ SOLN
50.0000 mg | Freq: Four times a day (QID) | INTRAMUSCULAR | Status: DC | PRN
  Administered 2010-12-16: 50 mg via INTRAVENOUS

## 2010-12-16 NOTE — Progress Notes (Signed)
  Subjective:    Patient ID: Cristian Gonzales, male    DOB: May 06, 1956, 55 y.o.   MRN: [id number]  HPI Cristian Gonzales is a 55 year old man male, nonsmoker, who comes in with a 12 hour history of fever, chills, nausea, vomiting, and diarrhea.  Other family members have been well   Review of Systems    General in GI review of systems otherwise negative Objective:   Physical Exam    Well-developed well-nourished man in no acute distress.  Examination the abdomen says the abdomen appears to be normal.  The bowel sounds are normal.  There is no tenderness.  No rebound    Assessment & Plan:  Viral gastroenteritis,,,,,,,,, hold all medications, Tylenol for fever, chills, clear liquids, 50 mg of Phenergan IM now, and 4 mg of Zofran p.r.n.

## 2010-12-16 NOTE — Patient Instructions (Signed)
Tylenol for fever and chills.  Liquid diet.  Zofran 4 mg every 6 hours for nausea and vomiting.  Return p.r.n.

## 2010-12-17 ENCOUNTER — Encounter: Payer: 59 | Admitting: Physical Therapy

## 2010-12-22 LAB — BASIC METABOLIC PANEL
CO2: 26 mEq/L (ref 19–32)
Calcium: 9 mg/dL (ref 8.4–10.5)
GFR calc Af Amer: >60 mL/min (ref >60)
GFR calc non Af Amer: >60 mL/min (ref >60)
Potassium: 3.8 mEq/L (ref 3.5–5.1)
Sodium: 136 mEq/L (ref 135–145)

## 2010-12-22 LAB — CBC
HCT: 37.5 % — ABNORMAL LOW (ref 39.0–52.0)
Hemoglobin: 12.3 g/dL — ABNORMAL LOW (ref 13.0–17.0)
MCHC: 32.9 g/dL (ref 30.0–36.0)
RBC: 4.26 MIL/uL (ref 4.22–5.81)

## 2010-12-22 LAB — PROTIME-INR: INR: 1.1 (ref 0.00–1.49)

## 2010-12-22 LAB — APTT: aPTT: 34 seconds (ref 24–37)

## 2010-12-28 ENCOUNTER — Inpatient Hospital Stay (INDEPENDENT_AMBULATORY_CARE_PROVIDER_SITE_OTHER)
Admission: RE | Admit: 2010-12-28 | Discharge: 2010-12-28 | Disposition: A | Discharge status: Home / Self Care | Payer: 59 | Source: Ambulatory Visit | Attending: Family Medicine | Admitting: Family Medicine

## 2010-12-28 DIAGNOSIS — H109 Unspecified conjunctivitis: Secondary | ICD-10-CM

## 2010-12-29 ENCOUNTER — Other Ambulatory Visit: Payer: Self-pay | Admitting: Family Medicine

## 2010-12-31 ENCOUNTER — Encounter: Payer: Self-pay | Admitting: Family Medicine

## 2010-12-31 ENCOUNTER — Ambulatory Visit (INDEPENDENT_AMBULATORY_CARE_PROVIDER_SITE_OTHER): Payer: 59 | Admitting: Family Medicine

## 2010-12-31 VITALS — BP 120/88 | Temp 98.5°F | Wt 226.0 lb

## 2010-12-31 DIAGNOSIS — R030 Elevated blood-pressure reading, without diagnosis of hypertension: Secondary | ICD-10-CM

## 2010-12-31 NOTE — Progress Notes (Signed)
  Subjective:    Patient ID: TARICK PARENTEAU, male    DOB: May 05, 1956, 55 y.o.   MRN: [id number]  Charma Igo is a 55 year old, married male, nonsmoker, who comes in today for evaluation of high blood pressure.  He was seen at an urgent care for a stye in his right eye and was told his blood pressure was elevated and come see [place] for follow-up.  His BP.  There was 150/90.  He's never had hypertension.  The past.  Negative family history of high blood pressure.      Review of Systems General and cardiovascular review of systems otherwise negative   Objective:   Physical Exam    Well-developed well-nourished, male in no acute distress.  BP right arm sitting position 120/84    Assessment & Plan:  Normotensive,,,,,,,,,,,, reassured

## 2010-12-31 NOTE — Patient Instructions (Signed)
Your blood pressure is normal.  Return p.r.n.

## 2011-01-19 NOTE — Op Note (Signed)
Cristian Gonzales, Cristian Gonzales NO.:  [id number]   MEDICAL RECORD NO.:  [id number]          PATIENT TYPE:  INP   LOCATION:  1618                         FACILITY:  [facility]   PHYSICIAN:  Erasmo Leventhal, M.D.DATE OF BIRTH:  [birth year]   DATE OF PROCEDURE:  08/23/2008  DATE OF DISCHARGE:                               OPERATIVE REPORT   PREOPERATIVE DIAGNOSIS:  End-stage osteoarthritis.   POSTOPERATIVE DIAGNOSIS:  End-stage osteoarthritis.   PROCEDURE:  Right total knee arthroplasty.   SURGEON:  Erasmo Leventhal, M.D.   ASSISTANT:  Jaquelyn Bitter. Chabon, P.A.   ANESTHESIA:  Spinal with monitored anesthesia care.   ESTIMATED BLOOD LOSS:  Less than 100 mL.   DRAINS:  One medium Hemovac.   COMPLICATIONS:  None.   TOURNIQUET TIME:  Was 80 minutes at 300 mmHg.   OPERATIVE IMPLANTS:  [employer] size 4 femur, size  5 tibia, 10 mm posterior stabilized rotating platform tibial insert and  a 38 mm all polyethylene patella all cemented.   PROCEDURE IN DETAIL:  The patient was counseled in the holding area.  Correct side was identified.  IV was started.  Taken to the operating  room where spinal anesthetic was administered, IV antibiotics were  given.  Foley catheter was placed under sterile technique by the OR  circulating nurse.  All extremities were well-padded and bumped.  Right  knee was examined.  He had 5 degree flexion contracture.  He was in  varus.  He could flex 110 degrees.  Exsanguinated with Esmarch and tourniquet was inflated to 300 mmHg.  Straight midline incision was made in the skin and subcutaneous tissue.  Medial soft tissue flaps developed at appropriate level and hemostasis  obtained.  Medial parapatellar arthrotomy was performed and soft tissue  release was done due to his varus malalignment.  Knee was then flexed.  Cruciate ligament was absent.  Posterior cruciate ligament was resected.  End-stage arthritis in all 3  compartments.  Femur canal irrigated and  effluent was clear.  Intramedullary rod was gently placed.  I chose a 5  degree valgus cut and took a 10 mm cut off the distal femur.  Medial  menisci removed under direct visualization.  Posterior vascular  structures were protected.   The femur was found to be a size 4.  Rotation marks made and cut to fit  a size 4.  The tibia was subluxed.  Extramedullary alignment guide for  the tibia was set in position and put in correct rotation and coverage.  We chose a 10 mm cut off the least deficient side which was the lateral  side and 0 degree slope.  Proximal tibia was found be a size 5.  Posterior medial posterior femoral osteophytes removed under direct  visualization with flexor extension blocks with 10 insert we were well-  balanced.  Tibial baseplate was applied.  Rotation cover set.  Reamer  and punch performed.  Femoral box cut was cut and the femoral trial was  implanted and placed.  With size 4 femur, size 5 tibia, 10 insert we had  excellent range of motion, soft tissue balance and alignment.  The  patella was found to be a size 38 and appropriate amount of bone was  resected.  Locking holes were made and patellar button was applied for a  38 patella button.  All trials were removed.  The knee was irrigated  with pulsatile lavage.  Utilizing modern cement technique all components  were cemented in place, size 5 tibia, size 4 femur, 38 patella.  After  the cement had cured we put the 10 insert and we checked with the 10  insert and we had full extension, flexion ___________ varus and valgus  stress and 0, 30, 60 and 90 degrees of flexion, trials tracking  anatomically.  Trials removed and again the knee was irrigated and  excess cement was removed and a final 4 mm, size 4, 10 mm posterior  stabilized rotating platform tibial insert was implanted.  Now I again  checked the knee, excellent alignment, rotation and balance.   A medium Hemovac  drain was placed.  A sequential closure in layers was  done.  Arthrotomy closed with Vicryl, subcu Vicryl, skin with subcu  Monocryl suture.  Steri-Strips applied.  Drain hooked to suction.  He  had normal circulation of the foot and ankle at the end of the case.  Ice pack was applied.  He mobilized full extension.  He was then taken  to the operating room in stable condition.  Sponge and needle count  correct.  No complications or problems.   Help with surgical technique and decision making, Mr. Brett Canales Chabon's PA-  C assistance was needed throughout the entire case.           ______________________________  Erasmo Leventhal, M.D.     RAC/MEDQ  D:  08/23/2008  T:  08/24/2008  Job:  [id number]

## 2011-01-19 NOTE — Op Note (Signed)
Cristian Gonzales, KRAPF              ACCOUNT NO.:  [id number]   MEDICAL RECORD NO.:  [id number]          PATIENT TYPE:  AMB   LOCATION:  DAY                          FACILITY:  [facility]   PHYSICIAN:  Erasmo Leventhal, M.D.DATE OF BIRTH:  07/02/1956   DATE OF PROCEDURE:  10/10/2008  DATE OF DISCHARGE:                               OPERATIVE REPORT   PREOPERATIVE DIAGNOSIS:  Right knee postoperative arthrofibrosis.   POSTOPERATIVE DIAGNOSIS:  Right knee postoperative arthrofibrosis.   PROCEDURE:  Right knee manipulation under anesthesia.   SURGEON:  Dr. Valma Cava.   ANESTHESIA:  Femoral nerve block with general.   ESTIMATED BLOOD LOSS:  None.   COMPLICATIONS:  None.   TOURNIQUET TIME:  None.   DISPOSITION:  To PACU stable.   OPERATIVE DETAILS:  The patient was counseled in the holding area.  The  correct site was identified, IV was started.  IV sedation was given.  A  femoral nerve block was administered.  He was then taken to the  operating room and placed under general anesthesia.  The right knee was  examined initially.  Initial range of motion was 10 to approximately 70  degrees.  After gently manipulation under anesthesia in both flexion and  extension, his final range of motion was 5-110 degrees.  It was a  difficult manipulation.  There were no complications or problems.  Ace  wrap, ice pack and knee immobilizer were applied.  He tolerated the  procedure well with no complications.  He was taken from the operating  room to the PACU in stable condition.  During the surgical procedure  there was no noted adverse problems or difficulties.  Although it was  quite difficult pushing, it was done very gently and meticulously.           ______________________________  Erasmo Leventhal, M.D.     RAC/MEDQ  D:  10/10/2008  T:  10/10/2008  Job:  [id number]

## 2011-01-19 NOTE — Discharge Summary (Signed)
NAMEKORION, CUEVAS NO.:  [id number]   MEDICAL RECORD NO.:  [id number]          PATIENT TYPE:  INP   LOCATION:  1618                         [facility]ACILITY:  [facility]   PHYSICIAN:  Erasmo Leventhal, M.D.DATE OF BIRTH:  1956/09/02   DATE OF ADMISSION:  08/23/2008  DATE OF DISCHARGE:  08/27/2008                               DISCHARGE SUMMARY   ADMISSION DIAGNOSIS:  End-stage osteoarthritis right knee.   DISCHARGE DIAGNOSIS:  End-stage osteoarthritis right knee.   OPERATION:  Total knee arthroplasty right knee.   BRIEF HISTORY:  This is a 55 year old gentleman with a history of end-  stage osteoarthritis of his right knee with failure of conservative  treatment to alleviate his symptoms.  After discussion of  benefits,  risks and options, the patient is now scheduled for total knee  arthroplasty.   LABORATORY VALUES:  Admission CBC within normal limits.  Admission  urinalysis within normal limits.  Admission PT/PTT within normal limits.  Admission CMET within normal limits.  Hemoglobin/hematocrit reached a  low of 10.5 and 31.4 on August 26, 2008.  He had mildly elevated  glucose of 115-144 range.   COURSE IN THE HOSPITAL:  The patient tolerated the operative procedure  well.  First postoperative day, he had moderate pain, vital signs were  stable, afebrile.  Dressing was dry.  Drain was removed intact and he  was started in physical therapy.  Second postoperative day, he was doing  better.  He had some soreness in his quad at the area of his tourniquet.  His quads were soft.  There was no evidence of compartment syndrome.  Vital signs stable, afebrile.  Dressing was changed.  Wound was benign.  Calves were negative.  The patient was weaned off the PCA and continued  with physical therapy.  He also had ice and heat to the quad as needed.  Third postoperative day, vital signs were stable.  Temperature max of  99.2.  Hemoglobin 10.5, hematocrit 31.4, WBC 10.7.   Lungs were clear.  Heart sounds were normal.  Bowel sounds were active.  Calves negative.  Dressing was changed.  Wound was benign.  Thigh was soft and less  painful.  Neurovascular status intact to foot.  Plans were made for  discharge in the morning.  On the fourth postoperative day, he was  feeling better, vital signs were stable, afebrile.  Lungs were clear.  Heart sounds were normal.  Bowel sounds were active.  Calves negative.  Wound benign.  The patient is subsequently discharged home for follow up  in the office.   CONDITION ON DISCHARGE:  Improved.   DISCHARGE MEDICATIONS:  1. Oxycodone 5 mg 1-2 q.4-6 h. p.r.n. pain.  2. Robaxin 500 one p.o. daily q.8 h. p.r.n. spasm.  3. Lovenox 30 mg 1 shot each morning at 7 a.m. and each evening at 7      p.m. until 2 weeks postoperative and then switch over to 1 baby      aspirin a day.  4. Iron 325 mg 1 b.i.d. for his anemia.   DISCHARGE INSTRUCTIONS:  He is to  keep the wound clean and dry, do his  therapy and CPM as directed.  Call the office for return appointment in  2 weeks, call sooner p.r.n. problems.   DISCHARGE DIET:  Low-sodium, heart-healthy diet.      Jaquelyn Bitter. Chabon, P.A.    ______________________________  Erasmo Leventhal, M.D.    SJC/MEDQ  D:  08/27/2008  T:  08/27/2008  Job:  [id number]

## 2011-01-19 NOTE — H&P (Signed)
NAMEMARKY, BURESH NO.:  [id number]   MEDICAL RECORD NO.:  [id number]          PATIENT TYPE:  INP   LOCATION:                               FACILITY:  [facility]   PHYSICIAN:  Erasmo Leventhal, M.D.DATE OF BIRTH:  08/24/1956   DATE OF ADMISSION:  08/23/2008  DATE OF DISCHARGE:                              HISTORY & PHYSICAL   CHIEF COMPLAINT:  Right knee end-stage osteoarthritis.   HISTORY OF PRESENT ILLNESS:  This is a 55 year old gentleman with  history of end-stage osteoarthritis of the right knee with failure of  conservative treatment to alleviate his pain.  After discussion of  treatment risks and benefits, risks and options the patient is now  scheduled for total knee arthroplasty.  He has had medical clearance  from Dr. Tawanna Cooler his medical doctor and surgery will go ahead and schedule  under spinal anesthesia.   DRUG ALLERGIES:  None.   CURRENT MEDICATIONS:  Ativan and Lexapro and recently started on  diclofenac sodium for a back strain.   PAST SURGICAL HISTORY:  Previous surgeries include cervical fusion and  several knee arthroscopies.   PAST MEDICAL HISTORY:  Serious medical illnesses none.   FAMILY HISTORY:  Positive for coronary artery disease and cancer.   SOCIAL HISTORY:  The patient is married.  He works in a Arts development officer.  He smokes 2 cigars per day and does not drink.   REVIEW OF SYSTEMS:  CENTRAL NERVOUS SYSTEM:  Positive for anxiety.  PULMONARY:  Negative for shortness of breath, PND and orthopnea.  CARDIOVASCULAR:  Negative for chest pain or palpitation.  GI:  Negative  for ulcers or hepatitis.  GU:  Negative for urinary tract difficulty.  MUSCULOSKELETAL:  Positive as in HPI.   PHYSICAL EXAMINATION:  VITAL SIGNS:  BP 124/82, respirations 16, pulse  72 and regular.  GENERAL APPEARANCE:  This is a well-developed, well-nourished gentleman  in no acute distress.  HEENT:  Head normocephalic.  Nose patent.  Ears patent.  Pupils  equal,  round and reactive to light.  Throat without injection.  NECK:  Supple without adenopathy.  Carotids 2+ without bruit.  CHEST:  Clear to auscultation.  No rales or rhonchi.  Respirations 16,  pulse 72 beats per without murmur.  ABDOMEN:  Soft with active bowel sounds.  No masses or organomegaly.  NEUROLOGIC:  The patient is alert and oriented to time, place and  person.  Cranial nerves II-XII grossly intact.  EXTREMITIES:  Shows the right knee with a 5 degree flexion contracture,  further flexion to 125 degrees.  Dorsalis pedis and posterior tibialis  pulses are 2+.   X-rays show end-stage osteoarthritis of the right knee.   IMPRESSION:  End-stage osteoarthritis right knee.   PLAN:  Total knee arthroplasty right knee.      Jaquelyn Bitter. Chabon, P.A.    ______________________________  Erasmo Leventhal, M.D.    SJC/MEDQ  D:  08/07/2008  T:  08/07/2008  Job:  [id number]

## 2011-01-22 NOTE — H&P (Signed)
[place]. [facility]  Patient:    Cristian Gonzales, Cristian Gonzales                        MRN: [id number] Attending:  Evette Georges, M.D. [facility] CC:         Evette Georges, M.D. [facility]                         History and Physical  DATE OF BIRTH:  1956/01/26  PRIMARY PHYSICIAN:  Evette Georges, M.D. [facility]  EVALUATION PHYSICIAN:  Evette Georges, M.D. [facility]  EMERGENCY ROOM REFERRING PHYSICIAN:  R. Briscoe Burns, M.D.  HISTORY OF PRESENT ILLNESS:  Mr. Ferrari is a 55 year old married male who came to the emergency room last night approximately 10 p.m. for evaluation of chest pain.  Patient says he was fine until about 8 p.m. last night when he went to lie down and he noticed some upper anterior chest pain.  He says the pain was such that he could not take a deep breath; if he did it would make it hurt worse. If he sat up he felt fine; if he laid down it felt worse.  He tried some Rolaids that did not help.  He had no nausea.  He had some belching and burping.  He had eaten a spaghetti dinner approximately 6 p.m.  He consumed no alcohol.  He has had history of "indigestion" in the past.  He has never had any history of any cardiac history.  He has no high blood pressure, diabetes, or hyperlipidemia.  He exercises on a regular basis, indeed, yesterday rode his bike five miles and had no chest pain.  He came to the emergency room and had complete evaluation by Dr. Quentin Ore.  Evaluation included blood work, chemistries, CPK-MB, and troponin x 2 both which were normal.  Chest x-ray was normal.  Arterial blood gasses were done but they were venous blood.  Because of that concern, a CT of his chest was negative for PE.  He was observed overnight in the emergency room.  Dr. Ether Griffins called me approximately 2:30 a.m. stating that there were no beds, they were going to watch him here, he was clinically stable and did not feel that I needed to come in at that  juncture and that if I came in at 7 he thought that was sufficient because they were going to monitor him anyway.  I came in at 7 a.m. and did a complete evaluation.  At that time we felt that it was probably noncardiac.  I therefore called Dr. Charlton Haws.  He concurred and recommended outpatient Cardiolite at [phone] at the main office.  This will be arranged as an outpatient.  PAST SURGICAL HISTORY: He was hospitalized for a cervical disk surgery by Dr. Channing Mutters in the fall of 2000.  He recovered without sequela.  He fractured his neck when he was 13.  He has had cartilage surgery to his right knee.  PAST MEDICAL HISTORY:  As above.  Other than that negative.  ALLERGIES:  TETANUS made him have increased temperature and vomiting.  MEDICATIONS:  None on a regular basis.  SOCIAL HISTORY:  He smokes an occasional cigar which he describes as two per week.  He does not drink any alcohol except for an occasional drink.  He says if he does not drink wine and caffeine he does not have the  problem with his urinary tract that he had previously.  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  FAMILY HISTORY:  Dad died 34 of a cardiac arrhythmia.  No underlying coronary disease.  Mom 79 alive and well.  Has had bypass surgery two years ago.  One brother and one sister both in good health.  REVIEW OF SYSTEMS:  HEENT:  Negative except for reading glasses.  Thyroid was negative.  CARDIOPULMONARY:  Negative.  GI:  Negative except for above.  GU: Negative except for history of prostatitis and occasional BPH.  Now that he is off caffeine and wine he has had no urinary tract symptoms.  MUSCULOSKELETAL: Negative except for cartilage tear, right knee.  PHYSICAL EXAMINATION:  VITAL SIGNS:  Temperature 98.  Pulse 70 and regular.  Respirations 12 and regular.  Blood pressure 126/84 on admission.  GENERAL:  He is a well-developed, well-nourished white male in no acute distress.  NECK:  Normal.  Supple.   Thyroid was not enlarged.  HEENT:  Negative.  CHEST:  Clear to auscultation.  CARDIAC:  Negative.  ABDOMEN:  Negative.  EXTREMITIES:  Normal.  No calf tenderness or edema.  LABORATORIES:  Normal EKG.  Arterial blood gasses had a pH of 7.50 but it was venous blood.  PCO2 34, PO2 48.  That was a venous stick.  Will not repeat that.  The original blood gas, he says, was too painful.  CT scan negative for PE.  The electrolytes were normal.  The hematocrit was normal at 41.  The CPK total was 80 with an MB of 0.8.  The troponin was negative x 2.  First level was 0.06, second level pending.  IMPRESSION:  Chest pain probably not cardiac.  I think he may have severe reflux esophagitis.  Therefore, will send him home on a restricted diet. Nothing to eat or drink for three hours prior to bedtime.  No caffeine.  No peppermint.  No alcohol.  He is a smoker.  Encouraged him to stay away from cigars.  Will start him on Prilosec 20 mg q.d. and get a follow-up Cardiolite as an outpatient. DD:  12/27/99 TD:  12/27/99 Job: [id number]

## 2011-04-06 ENCOUNTER — Ambulatory Visit (INDEPENDENT_AMBULATORY_CARE_PROVIDER_SITE_OTHER): Payer: 59 | Admitting: Internal Medicine

## 2011-04-06 ENCOUNTER — Encounter: Payer: Self-pay | Admitting: Internal Medicine

## 2011-04-06 VITALS — BP 160/98 | Temp 99.1°F

## 2011-04-06 DIAGNOSIS — L0291 Cutaneous abscess, unspecified: Secondary | ICD-10-CM

## 2011-04-06 DIAGNOSIS — L0390 Cellulitis, unspecified: Secondary | ICD-10-CM

## 2011-04-06 NOTE — Patient Instructions (Signed)
Take your antibiotic as prescribed until ALL of it is gone, but stop if you develop a rash, swelling, or any side effects of the medication.  Contact our office as soon as possible if  there are side effects of the medication.  Call or return to clinic prn if these symptoms worsen or fail to improve as anticipated.  

## 2011-04-06 NOTE — Progress Notes (Signed)
  Subjective:    Patient ID: Cristian Gonzales, male    DOB: 1956-03-27, 55 y.o.   MRN: [id number]  HPI  55 year old patient is status post right total knee replacement surgery approximately 2 and half years ago. For the past week he has noted a lesion involving the superior aspect of the surgical scar. This has been draining minimally but has been quite superficial. For the past couple days he has felt unwell with weakness and low-grade fever. He also describes some discomfort in the anterior thigh region proximal to the wound.    Review of Systems  Constitutional: Positive for fever and fatigue.  Skin: Positive for wound.       Objective:   Physical Exam  Constitutional: He appears well-developed and well-nourished. No distress.       Repeat blood pressure 140/80 Overweight Temperature 9.1  Skin:       The patient had a 0.5 x 1.5 superficial ulcerated type lesion involving the very superior aspect of his surgical scar involving his right knee; this appeared to be very superficial clean without any surrounding erythema or fluctuance. There is no drainage. He had mild tenderness involving his anterior thigh but no signs of soft tissue infection          Assessment & Plan:   Superficial wound right knee. Local care discussed he will wash twice daily and wrap. Due to his systemic symptoms we'll treat for an early cellulitis with Avelox 400 mg daily. Samples were provided;  he was given his initial dose in the office. He will report any clinical worsening

## 2011-05-04 ENCOUNTER — Ambulatory Visit (INDEPENDENT_AMBULATORY_CARE_PROVIDER_SITE_OTHER): Payer: 59 | Admitting: Family Medicine

## 2011-05-04 ENCOUNTER — Encounter: Payer: Self-pay | Admitting: Family Medicine

## 2011-05-04 DIAGNOSIS — R5383 Other fatigue: Secondary | ICD-10-CM | POA: Insufficient documentation

## 2011-05-04 DIAGNOSIS — R5381 Other malaise: Secondary | ICD-10-CM

## 2011-05-04 LAB — POCT URINALYSIS DIPSTICK
Leukocytes, UA: NEGATIVE
Protein, UA: NEGATIVE
Spec Grav, UA: 1.03
Urobilinogen, UA: 0.2

## 2011-05-04 LAB — CBC WITH DIFFERENTIAL/PLATELET
Basophils Relative: 0.8 % (ref 0.0–3.0)
Eosinophils Relative: 5.2 % — ABNORMAL HIGH (ref 0.0–5.0)
HCT: 44.1 % (ref 39.0–52.0)
Lymphs Abs: 2.1 10*3/uL (ref 0.7–4.0)
MCV: 91.1 fl (ref 78.0–100.0)
Monocytes Absolute: 0.6 10*3/uL (ref 0.1–1.0)
Neutro Abs: 3.4 10*3/uL (ref 1.4–7.7)
RBC: 4.84 Mil/uL (ref 4.22–5.81)
WBC: 6.5 10*3/uL (ref 4.5–10.5)

## 2011-05-04 LAB — HEPATIC FUNCTION PANEL
ALT: 29 U/L (ref 0–53)
Albumin: 4.5 g/dL (ref 3.5–5.2)
Total Protein: 7.7 g/dL (ref 6.0–8.3)

## 2011-05-04 LAB — TSH: TSH: 1.41 u[IU]/mL (ref 0.35–5.50)

## 2011-05-04 LAB — BASIC METABOLIC PANEL
CO2: 27 mEq/L (ref 19–32)
Chloride: 104 mEq/L (ref 96–112)
Potassium: 4.5 mEq/L (ref 3.5–5.1)

## 2011-05-04 NOTE — Progress Notes (Signed)
  Subjective:    Patient ID: DEMETRIO LEIGHTY, male    DOB: Apr 30, 1956, 55 y.o.   MRN: [id number]  Charma Igo is a 55 year old, married male, nonsmoker, who comes in today with a 3 week history of upset stomach aching all over.  He states he was here 3 weeks ago and had a soft tissue infection in an incision above his right knee.  He had a total knee replacement about two years ago.  He saw Dr. Purnell Shoemaker, who gave him Avelox for one week.  Since starting Avelox to 7 upset stomach, but no nausea, vomiting, or weight loss.  He also has been aching all over.  He took the Avelox for week and finished it.  He then took 4 days of amoxicillin last week that his son had left over.  No skin rash, etc.  No history of previous drug reactions    Review of Systems Total review of systems otherwise negative except for above    Objective:   Physical Exam Well-developed well-nourished man no acute distress.  Examination of the HEENT were negative.  The neck was supple.  No adenopathy.  Thyroid not enlarged.  Lungs are clear to auscultation.  Cardiac exam negative down exam negative.  No skin rash.  There appears to be a small lesion at these superior portion of his incision where a stitch is probably trying to surface.  No evidence of secondary infection       Assessment & Plan:  Symptoms secondary to possible medication reaction.  Plan check labs

## 2011-05-04 NOTE — Patient Instructions (Signed)
I will call you When I get your  Lab work back.

## 2011-06-08 LAB — POCT URINALYSIS DIP (DEVICE)
Protein, ur: 30 mg/dL — AB
Specific Gravity, Urine: >=1.03 (ref 1.005–1.030)
Urobilinogen, UA: 0.2 mg/dL (ref 0.0–1.0)

## 2011-06-11 LAB — BASIC METABOLIC PANEL
BUN: 16 mg/dL (ref 6–23)
CO2: 26 mEq/L (ref 19–32)
CO2: 28 mEq/L (ref 19–32)
CO2: 29 mEq/L (ref 19–32)
Calcium: 8.3 mg/dL — ABNORMAL LOW (ref 8.4–10.5)
Calcium: 8.4 mg/dL (ref 8.4–10.5)
Chloride: 98 mEq/L (ref 96–112)
Creatinine, Ser: 0.85 mg/dL (ref 0.4–1.5)
Creatinine, Ser: 0.99 mg/dL (ref 0.4–1.5)
GFR calc Af Amer: >60 mL/min (ref >60)
GFR calc Af Amer: >60 mL/min (ref >60)
GFR calc Af Amer: >60 mL/min (ref >60)
GFR calc non Af Amer: >60 mL/min (ref >60)
Glucose, Bld: 106 mg/dL — ABNORMAL HIGH (ref 70–99)
Potassium: 3.7 mEq/L (ref 3.5–5.1)
Sodium: 134 mEq/L — ABNORMAL LOW (ref 135–145)
Sodium: 136 mEq/L (ref 135–145)

## 2011-06-11 LAB — CROSSMATCH: ABO/RH(D): O POS

## 2011-06-11 LAB — URINALYSIS, ROUTINE W REFLEX MICROSCOPIC
Glucose, UA: NEGATIVE mg/dL
Nitrite: NEGATIVE
pH: 6.5 (ref 5.0–8.0)

## 2011-06-11 LAB — ABO/RH: ABO/RH(D): O POS

## 2011-06-11 LAB — DIFFERENTIAL
Eosinophils Relative: 2 % (ref 0–5)
Lymphocytes Relative: 21 % (ref 12–46)
Lymphs Abs: 1.9 10*3/uL (ref 0.7–4.0)
Monocytes Absolute: 0.6 10*3/uL (ref 0.1–1.0)
Monocytes Relative: 6 % (ref 3–12)

## 2011-06-11 LAB — CBC
HCT: 31.4 % — ABNORMAL LOW (ref 39.0–52.0)
Hemoglobin: 10.5 g/dL — ABNORMAL LOW (ref 13.0–17.0)
Hemoglobin: 10.8 g/dL — ABNORMAL LOW (ref 13.0–17.0)
MCHC: 33.5 g/dL (ref 30.0–36.0)
MCHC: 33.5 g/dL (ref 30.0–36.0)
MCV: 89.7 fL (ref 78.0–100.0)
Platelets: 293 10*3/uL (ref 150–400)
Platelets: 376 10*3/uL (ref 150–400)
RBC: 3.53 MIL/uL — ABNORMAL LOW (ref 4.22–5.81)
RBC: 3.57 MIL/uL — ABNORMAL LOW (ref 4.22–5.81)
RBC: 3.77 MIL/uL — ABNORMAL LOW (ref 4.22–5.81)
RDW: 13.8 % (ref 11.5–15.5)
WBC: 11.5 10*3/uL — ABNORMAL HIGH (ref 4.0–10.5)
WBC: 9.1 10*3/uL (ref 4.0–10.5)

## 2011-06-11 LAB — COMPREHENSIVE METABOLIC PANEL
AST: 22 U/L (ref 0–37)
Albumin: 4.3 g/dL (ref 3.5–5.2)
Calcium: 9.7 mg/dL (ref 8.4–10.5)
Chloride: 105 mEq/L (ref 96–112)
Creatinine, Ser: 0.95 mg/dL (ref 0.4–1.5)
GFR calc Af Amer: >60 mL/min (ref >60)
Total Protein: 6.7 g/dL (ref 6.0–8.3)

## 2011-06-24 LAB — POCT URINALYSIS DIP (DEVICE)
Bilirubin Urine: NEGATIVE
Glucose, UA: NEGATIVE
Nitrite: NEGATIVE
Operator id: 235561
Specific Gravity, Urine: >=1.03
Urobilinogen, UA: 0.2

## 2011-06-30 ENCOUNTER — Encounter: Payer: Self-pay | Admitting: Family Medicine

## 2011-06-30 ENCOUNTER — Ambulatory Visit (INDEPENDENT_AMBULATORY_CARE_PROVIDER_SITE_OTHER): Payer: 59 | Admitting: Family Medicine

## 2011-06-30 VITALS — BP 120/80 | Temp 98.4°F | Wt 228.0 lb

## 2011-06-30 DIAGNOSIS — Z23 Encounter for immunization: Secondary | ICD-10-CM

## 2011-06-30 DIAGNOSIS — R0789 Other chest pain: Secondary | ICD-10-CM

## 2011-06-30 NOTE — Progress Notes (Signed)
  Subjective:    Patient ID: Cristian Gonzales, male    DOB: [birth year], 55 y.o.   MRN: [id number]  HPI Cristian Gonzales is a 55 year old, married man nonsmoker, who comes in today for evaluation of atypical chest pain.  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  He describes the pain as constant, dull, a 5 on a scale of one to 6 however, did not keep him awake last night.  He has no difficulty swallowing.  He has no difficulty walking.  The pain is not increased by exercise.  He has no cardiac no pulmonary symptoms.   Review of Systems    General and cardiovascular and pulmonary venous systems otherwise negative Objective:   Physical Exam Well-developed well-nourished man in no acute distress.  Examination of the lungs is normal.  Cardiac exam normal.  There is no palpable tenderness in the chest wall       Assessment & Plan:  Chest wall pain.  Plan Motrin 400 b.i.d. Return p.r.n.

## 2011-06-30 NOTE — Patient Instructions (Signed)
Takes 600 mg of Motrin twice daily with food for the chest wall pain.  Return p.r.n.

## 2011-07-01 ENCOUNTER — Other Ambulatory Visit: Payer: Self-pay | Admitting: Family Medicine

## 2011-07-01 MED ORDER — LORAZEPAM 1 MG PO TABS: 1.0000 mg | ORAL_TABLET | Freq: Two times a day (BID) | ORAL | Status: DC

## 2011-07-01 NOTE — Telephone Encounter (Signed)
Pt called to check on status of getting refill for Lorazepam 1 mg to [facility].

## 2011-08-08 ENCOUNTER — Encounter (HOSPITAL_COMMUNITY): Payer: Self-pay | Admitting: Nurse Practitioner

## 2011-08-08 ENCOUNTER — Emergency Department (HOSPITAL_COMMUNITY)
Admission: EM | Admit: 2011-08-08 | Discharge: 2011-08-08 | Discharge status: Against Medical Advice | Payer: 59 | Attending: Emergency Medicine | Admitting: Emergency Medicine

## 2011-08-08 ENCOUNTER — Encounter (HOSPITAL_COMMUNITY): Payer: Self-pay | Admitting: *Deleted

## 2011-08-08 ENCOUNTER — Emergency Department (INDEPENDENT_AMBULATORY_CARE_PROVIDER_SITE_OTHER)
Admission: EM | Admit: 2011-08-08 | Discharge: 2011-08-08 | Disposition: A | Discharge status: Nursing Home (Includes ICF) | Payer: 59 | Source: Home / Self Care

## 2011-08-08 DIAGNOSIS — R209 Unspecified disturbances of skin sensation: Secondary | ICD-10-CM

## 2011-08-08 DIAGNOSIS — M542 Cervicalgia: Secondary | ICD-10-CM

## 2011-08-08 DIAGNOSIS — R200 Anesthesia of skin: Secondary | ICD-10-CM

## 2011-08-08 NOTE — ED Notes (Signed)
Pt from ucc for further eval of posterior neck pain, L facial numbness noticed when he woke up today. No facial droop or arm drift, grips = bilateral. A&Ox4.

## 2011-08-08 NOTE — ED Notes (Signed)
Unable to locate patient at this time.
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
## 2011-08-08 NOTE — ED Notes (Signed)
C/O left facial numbness w/ difficulty hearing out of left ear (no pain) w/ posterior neck pain radiating up into head.  No fevers.  Reports post-nasal drip.  Took some cold medicine during night without relief.  Denies weakness, dizziness.

## 2011-08-09 ENCOUNTER — Telehealth: Payer: Self-pay | Admitting: Family Medicine

## 2011-08-09 MED ORDER — HYDROCODONE-HOMATROPINE 5-1.5 MG/5ML PO SYRP: 2.5000 mL | ORAL_SOLUTION | Freq: Four times a day (QID) | ORAL | Status: AC | PRN

## 2011-08-09 NOTE — Telephone Encounter (Signed)
rx called in

## 2011-08-09 NOTE — Telephone Encounter (Signed)
Went to ER yesterday with a bad cough, left ear cloggness. Requesting cough medicine again to be sent to [facility]. Please return call. Thanks.

## 2011-08-09 NOTE — Telephone Encounter (Signed)
Hydromet 4 ounces to directions one half to 1 teaspoon nightly p.r.n. Cough.  No refill

## 2011-08-13 NOTE — ED Provider Notes (Signed)
Medical screening examination/treatment/procedure(s) were performed by non-physician practitioner and as supervising physician I was immediately available for consultation/collaboration.  Luiz Blare MD   Luiz Blare, MD 08/13/11 [phone]

## 2011-09-13 ENCOUNTER — Telehealth: Payer: Self-pay | Admitting: Family Medicine

## 2011-09-13 MED ORDER — CITALOPRAM HYDROBROMIDE 20 MG PO TABS: 20.0000 mg | ORAL_TABLET | Freq: Every day | ORAL | Status: DC

## 2011-09-13 NOTE — Telephone Encounter (Signed)
Please refill his anti-depressant meds to [facility] outpatient pharmacy. Thanks.

## 2011-09-14 ENCOUNTER — Other Ambulatory Visit: Payer: Self-pay | Admitting: *Deleted

## 2011-09-14 MED ORDER — CITALOPRAM HYDROBROMIDE 20 MG PO TABS: 20.0000 mg | ORAL_TABLET | Freq: Every day | ORAL | Status: DC

## 2011-09-20 ENCOUNTER — Encounter: Payer: Self-pay | Admitting: Family Medicine

## 2011-09-20 ENCOUNTER — Ambulatory Visit (INDEPENDENT_AMBULATORY_CARE_PROVIDER_SITE_OTHER): Payer: 59 | Admitting: Family Medicine

## 2011-09-20 VITALS — BP 120/84 | Temp 98.4°F | Wt 238.0 lb

## 2011-09-20 DIAGNOSIS — N451 Epididymitis: Secondary | ICD-10-CM

## 2011-09-20 DIAGNOSIS — N453 Epididymo-orchitis: Secondary | ICD-10-CM

## 2011-09-20 MED ORDER — CIPROFLOXACIN HCL 500 MG PO TABS: ORAL_TABLET | ORAL | Status: AC

## 2011-09-20 NOTE — Progress Notes (Signed)
  Subjective:    Patient ID: Cristian Gonzales, male    DOB: 1956-03-01, 56 y.o.   MRN: [id number]  HPI Cristian Gonzales is a 56 year old, married male, nonsmoker, who comes in today for treatment of epididymitis.  Last year.  He had an episode of epididymitis with a cystic lesion above his left testicle.  We therefore, sent him to urology for evaluation to rule out torsion.  Ultrasound was done and showed no torsion.  He was treated with oral antibiotics in the epididymitis resolved.  Three days ago he began having tenderness in that left testicle.  Again.  No fever no chills.  No trauma   Review of Systems    General and Urologic review of systems otherwise negative Objective:   Physical Exam Well-developed well-nourished, male in no acute distress.  Examination the genitalia shows both testicles appear normal.  There is some tenderness above the left testicle non-palpable masses.  No hernias       Assessment & Plan:  Epididymitis plan antibiotics.  Return here in

## 2011-09-20 NOTE — Patient Instructions (Signed)
Take the Cipro, one twice daily until the bottle is empty.  Return p.r.n.

## 2011-10-19 ENCOUNTER — Encounter: Payer: Self-pay | Admitting: Family Medicine

## 2011-10-19 ENCOUNTER — Ambulatory Visit (INDEPENDENT_AMBULATORY_CARE_PROVIDER_SITE_OTHER): Payer: 59 | Admitting: Family Medicine

## 2011-10-19 VITALS — BP 120/84 | Temp 98.9°F | Wt 231.0 lb

## 2011-10-19 DIAGNOSIS — B349 Viral infection, unspecified: Secondary | ICD-10-CM | POA: Insufficient documentation

## 2011-10-19 DIAGNOSIS — B9789 Other viral agents as the cause of diseases classified elsewhere: Secondary | ICD-10-CM

## 2011-10-19 MED ORDER — HYDROCODONE-HOMATROPINE 5-1.5 MG/5ML PO SYRP: ORAL_SOLUTION | ORAL | Status: DC

## 2011-10-19 NOTE — Patient Instructions (Signed)
Drink lots of liquids  Tylenol as needed for general symptoms  Hydromet up to 1/2 teaspoon 3 times daily as needed  Return when necessary

## 2011-10-19 NOTE — Progress Notes (Signed)
  Subjective:    Patient ID: Cristian Gonzales, male    DOB: [birth year], 56 y.o.   MRN: [id number]  HPI Cristian Gonzales is a 56 year old male nonsmoker who comes in with a five-day history of a viral syndrome manifested by fatigue no energy headache crampy abdominal pain loose bowel movements for the last 24 hours slight cough     Review of Systems    general and GI review of systems otherwise negative Objective:   Physical Exam Well-developed well-nourished male in no acute distress HEENT negative neck was supple no adenopathy lungs are clear abdominal exam was negative       Assessment & Plan:  Viral syndrome plan treat symptomatically with Tylenol lots of liquids Hydromet when necessary for cough and cold

## 2011-12-22 ENCOUNTER — Ambulatory Visit (INDEPENDENT_AMBULATORY_CARE_PROVIDER_SITE_OTHER): Payer: 59 | Admitting: Family Medicine

## 2011-12-22 ENCOUNTER — Encounter: Payer: Self-pay | Admitting: Family Medicine

## 2011-12-22 VITALS — BP 120/82 | HR 81 | Temp 98.4°F | Wt 232.0 lb

## 2011-12-22 DIAGNOSIS — N451 Epididymitis: Secondary | ICD-10-CM

## 2011-12-22 DIAGNOSIS — N453 Epididymo-orchitis: Secondary | ICD-10-CM

## 2011-12-22 MED ORDER — CIPROFLOXACIN HCL 500 MG PO TABS: 500.0000 mg | ORAL_TABLET | Freq: Two times a day (BID) | ORAL | Status: AC

## 2011-12-22 MED ORDER — OXYCODONE-ACETAMINOPHEN 10-325 MG PO TABS: 1.0000 | ORAL_TABLET | Freq: Four times a day (QID) | ORAL | Status: AC | PRN

## 2011-12-22 MED ORDER — LORAZEPAM 1 MG PO TABS: 1.0000 mg | ORAL_TABLET | Freq: Two times a day (BID) | ORAL | Status: DC

## 2011-12-23 ENCOUNTER — Encounter: Payer: Self-pay | Admitting: Family Medicine

## 2011-12-23 NOTE — Progress Notes (Signed)
  Subjective:    Patient ID: Cristian Gonzales, male    DOB: 16-Nov-1955, 56 y.o.   MRN: [id number]  HPI Here for 2 days of swelling and pain in the left testicle. No urinary sympotms or fever. No recent trauma. This is similar to 2 other episodes of the same thing in the past few years. He was treated for epididymitis on 09-18-10 and again on 11-16-11 with courses of Cipro. Each time he recovered completely. The first episode last year he was sent to Urology, where an UD revealed a small benign appearing left epididymal cyst but was otherwise normal.    Review of Systems  Constitutional: Negative.   Genitourinary: Positive for testicular pain. Negative for dysuria, frequency, hematuria, flank pain and difficulty urinating.       Objective:   Physical Exam  Constitutional:       Walking gingerly with pain  Genitourinary:       Very tender in the left testicle and the left upper epididymus. A small round tender well defined cystic mass is felt at the upper pole of the left testicle. No torsion is evident. No hernias          Assessment & Plan:  Recurrent epididymitis. Treat with 30 days of Cipro. Given meds for pain. He feels he can work since his job is not physically demanding. If this persists he may need to see Urology again.

## 2012-05-01 ENCOUNTER — Other Ambulatory Visit: Payer: Self-pay | Admitting: Family Medicine

## 2012-07-09 ENCOUNTER — Emergency Department (HOSPITAL_COMMUNITY)
Admission: EM | Admit: 2012-07-09 | Discharge: 2012-07-09 | Disposition: A | Discharge status: Home / Self Care | Payer: 59 | Source: Home / Self Care | Attending: Emergency Medicine | Admitting: Emergency Medicine

## 2012-07-09 ENCOUNTER — Encounter (HOSPITAL_COMMUNITY): Payer: Self-pay | Admitting: Emergency Medicine

## 2012-07-09 DIAGNOSIS — S335XXA Sprain of ligaments of lumbar spine, initial encounter: Secondary | ICD-10-CM

## 2012-07-09 DIAGNOSIS — S39012A Strain of muscle, fascia and tendon of lower back, initial encounter: Secondary | ICD-10-CM

## 2012-07-09 MED ORDER — KETOROLAC TROMETHAMINE 60 MG/2ML IM SOLN
60.0000 mg | Freq: Once | INTRAMUSCULAR | Status: AC
  Administered 2012-07-09: 60 mg via INTRAMUSCULAR

## 2012-07-09 MED ORDER — DICLOFENAC SODIUM 75 MG PO TBEC: 75.0000 mg | DELAYED_RELEASE_TABLET | Freq: Two times a day (BID) | ORAL | Status: DC

## 2012-07-09 MED ORDER — TRAMADOL HCL 50 MG PO TABS: 100.0000 mg | ORAL_TABLET | Freq: Three times a day (TID) | ORAL | Status: DC | PRN

## 2012-07-09 MED ORDER — METHOCARBAMOL 500 MG PO TABS: 500.0000 mg | ORAL_TABLET | Freq: Three times a day (TID) | ORAL | Status: DC

## 2012-07-09 MED ORDER — KETOROLAC TROMETHAMINE 60 MG/2ML IM SOLN
INTRAMUSCULAR | Status: AC
  Filled 2012-07-09: qty 2

## 2012-07-09 NOTE — ED Notes (Signed)
Reports lower back pain since yesterday morning.   Reports picking up tv and back has been hurting since.  Used OTC medication but no relief

## 2012-07-09 NOTE — ED Provider Notes (Signed)
Chief Complaint  Patient presents with  . Back Pain    History of Present Illness:   Cristian Gonzales is a 56 year old male who picked up a heavy TV set yesterday and immediately felt pain in his lower back along with muscle spasm. Ever since then the back has been sore and stiff. It hurts to move or to bend. His left foot feels numb and also his left hand feels numb. He denies any pain in the neck. He has a history of a cervical fusion done 10 years ago by Dr. Channing Mutters. He denies any weakness in the upper or lower extremities. He's had no dysuria, frequency, incontinence of urine, incontinence of stool, or saddle anesthesia. He denies any headache, fever, chills, weight loss. He takes Ativan and Celexa. He has had a right knee replacement.  Review of Systems:  Other than noted above, the patient denies any of the following symptoms: Systemic:  No fever, chills, severe fatigue, or unexplained weight loss. GI:  No abdominal pain, nausea, vomiting, diarrhea, constipation, incontinence of bowel, or blood in stool. GU:  No dysuria, frequency, urgency, or hematuria. No incontinence of urine or difficulty urinating.  M-S:  No neck pain, joint pain, arthritis, or myalgias. Neuro:  No paresthesias, saddle anesthesia, muscular weakness, or progressive neurological deficit.  PMFSH:  Past medical history, family history, social history, meds, and allergies were reviewed. Specifically, there is no history of cancer, major trauma, osteoporosis, immunosuppression, HIV, or IV or injection drug use.   Physical Exam:   Vital signs:  BP 107/86  Pulse 76  Temp 97.5 F (36.4 C) (Oral)  Resp 18 General:  Alert, oriented, in no distress. Abdomen:  Soft, non-tender.  No organomegaly or mass.  No pulsatile midline abdominal mass or bruit. Back:  There was pain to palpation at the L5-S1 level. His back has a limited range of motion with 60 of flexion, 10 of lateral bending, 10 extension, and 60 of rotation with pain in all  directions. Straight leg raising was negative with negative popliteal compression and a negative Lasegue's sign. Neuro:  Normal muscle strength, sensations and DTRs in the upper and lower extremities. Extremities: Pedal pulses were full, there was no edema. Skin:  Clear, warm and dry.  No rash.  Course in Urgent Care Center:   He was given Toradol 60 mg IM and tolerated this well without any immediate side effects.  Assessment:  The encounter diagnosis was Lumbar strain.  Plan:   1.  The following meds were prescribed:   New Prescriptions   DICLOFENAC (VOLTAREN) 75 MG EC TABLET    Take 1 tablet (75 mg total) by mouth 2 (two) times daily.   METHOCARBAMOL (ROBAXIN) 500 MG TABLET    Take 1 tablet (500 mg total) by mouth 3 (three) times daily.   TRAMADOL (ULTRAM) 50 MG TABLET    Take 2 tablets (100 mg total) by mouth every 8 (eight) hours as needed for pain.   2.  The patient was instructed in symptomatic care and handouts were given. 3.  The patient was told to return if becoming worse in any way, if no better in 2 weeks, and given some red flag symptoms that would indicate earlier return. 4.  The patient was encouraged to try to be as active as possible and given some exercises to do followed by moist heat.    Reuben Likes, MD 07/09/12 [phone]

## 2012-07-10 ENCOUNTER — Other Ambulatory Visit: Payer: Self-pay | Admitting: Family Medicine

## 2012-07-10 NOTE — Telephone Encounter (Signed)
This is a patient of Dr. Todd  

## 2012-07-10 NOTE — Telephone Encounter (Signed)
Rachel please refill his medication until his next physical examination 

## 2012-07-10 NOTE — Telephone Encounter (Signed)
Fleet Contras please refill his medication until his next physical examination

## 2012-08-25 ENCOUNTER — Ambulatory Visit (INDEPENDENT_AMBULATORY_CARE_PROVIDER_SITE_OTHER): Payer: 59 | Admitting: Family Medicine

## 2012-08-25 ENCOUNTER — Encounter: Payer: Self-pay | Admitting: Family Medicine

## 2012-08-25 VITALS — HR 86 | Temp 97.8°F

## 2012-08-25 DIAGNOSIS — J069 Acute upper respiratory infection, unspecified: Secondary | ICD-10-CM

## 2012-08-25 MED ORDER — ALBUTEROL SULFATE HFA 108 (90 BASE) MCG/ACT IN AERS: 2.0000 | INHALATION_SPRAY | Freq: Four times a day (QID) | RESPIRATORY_TRACT | Status: DC | PRN

## 2012-08-25 MED ORDER — HYDROCODONE-HOMATROPINE 5-1.5 MG/5ML PO SYRP: 5.0000 mL | ORAL_SOLUTION | Freq: Three times a day (TID) | ORAL | Status: DC | PRN

## 2012-08-25 NOTE — Patient Instructions (Signed)
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
## 2012-11-27 ENCOUNTER — Encounter (HOSPITAL_COMMUNITY): Payer: Self-pay | Admitting: Emergency Medicine

## 2012-11-27 ENCOUNTER — Emergency Department (HOSPITAL_COMMUNITY): Payer: 59

## 2012-11-27 ENCOUNTER — Observation Stay (HOSPITAL_COMMUNITY)
Admission: EM | Admit: 2012-11-27 | Discharge: 2012-11-28 | Disposition: A | Discharge status: Home / Self Care | Payer: 59 | Attending: Family Medicine | Admitting: Family Medicine

## 2012-11-27 DIAGNOSIS — F411 Generalized anxiety disorder: Secondary | ICD-10-CM

## 2012-11-27 DIAGNOSIS — F32A Depression, unspecified: Secondary | ICD-10-CM | POA: Diagnosis present

## 2012-11-27 DIAGNOSIS — R209 Unspecified disturbances of skin sensation: Secondary | ICD-10-CM | POA: Insufficient documentation

## 2012-11-27 DIAGNOSIS — I700 Atherosclerosis of aorta: Secondary | ICD-10-CM | POA: Insufficient documentation

## 2012-11-27 DIAGNOSIS — F329 Major depressive disorder, single episode, unspecified: Secondary | ICD-10-CM | POA: Insufficient documentation

## 2012-11-27 DIAGNOSIS — R42 Dizziness and giddiness: Secondary | ICD-10-CM | POA: Insufficient documentation

## 2012-11-27 DIAGNOSIS — N451 Epididymitis: Secondary | ICD-10-CM

## 2012-11-27 DIAGNOSIS — F3289 Other specified depressive episodes: Secondary | ICD-10-CM

## 2012-11-27 DIAGNOSIS — J309 Allergic rhinitis, unspecified: Secondary | ICD-10-CM

## 2012-11-27 DIAGNOSIS — B349 Viral infection, unspecified: Secondary | ICD-10-CM

## 2012-11-27 DIAGNOSIS — R55 Syncope and collapse: Principal | ICD-10-CM

## 2012-11-27 DIAGNOSIS — R52 Pain, unspecified: Secondary | ICD-10-CM | POA: Insufficient documentation

## 2012-11-27 DIAGNOSIS — IMO0001 Reserved for inherently not codable concepts without codable children: Secondary | ICD-10-CM | POA: Insufficient documentation

## 2012-11-27 DIAGNOSIS — Z96659 Presence of unspecified artificial knee joint: Secondary | ICD-10-CM | POA: Insufficient documentation

## 2012-11-27 DIAGNOSIS — F17200 Nicotine dependence, unspecified, uncomplicated: Secondary | ICD-10-CM | POA: Insufficient documentation

## 2012-11-27 DIAGNOSIS — R404 Transient alteration of awareness: Secondary | ICD-10-CM | POA: Insufficient documentation

## 2012-11-27 DIAGNOSIS — J45909 Unspecified asthma, uncomplicated: Secondary | ICD-10-CM

## 2012-11-27 HISTORY — DX: Major depressive disorder, single episode, unspecified: F32.9

## 2012-11-27 HISTORY — DX: Unspecified osteoarthritis, unspecified site: M19.90

## 2012-11-27 HISTORY — DX: Depression, unspecified: F32.A

## 2012-11-27 HISTORY — DX: Syncope and collapse: R55

## 2012-11-27 HISTORY — DX: Anxiety disorder, unspecified: F41.9

## 2012-11-27 LAB — POCT I-STAT TROPONIN I

## 2012-11-27 LAB — COMPREHENSIVE METABOLIC PANEL
ALT: 21 U/L (ref 0–53)
Alkaline Phosphatase: 81 U/L (ref 39–117)
CO2: 27 mEq/L (ref 19–32)
Chloride: 104 mEq/L (ref 96–112)
GFR calc Af Amer: >90 mL/min (ref >90)
GFR calc non Af Amer: 80 mL/min — ABNORMAL LOW (ref >90)
Glucose, Bld: 101 mg/dL — ABNORMAL HIGH (ref 70–99)
Potassium: 5.1 mEq/L (ref 3.5–5.1)
Sodium: 140 mEq/L (ref 135–145)
Total Bilirubin: 0.3 mg/dL (ref 0.3–1.2)

## 2012-11-27 LAB — TROPONIN I: Troponin I: <0.3 ng/mL (ref <0.30)

## 2012-11-27 LAB — CBC
HCT: 41.1 % (ref 39.0–52.0)
MCHC: 33.6 g/dL (ref 30.0–36.0)
MCV: 88.6 fL (ref 78.0–100.0)
RDW: 13.8 % (ref 11.5–15.5)
WBC: 6.2 10*3/uL (ref 4.0–10.5)

## 2012-11-27 LAB — CBC WITH DIFFERENTIAL/PLATELET
Hemoglobin: 14.6 g/dL (ref 13.0–17.0)
Lymphocytes Relative: 28 % (ref 12–46)
Lymphs Abs: 1.8 10*3/uL (ref 0.7–4.0)
Monocytes Relative: 8 % (ref 3–12)
Neutrophils Relative %: 60 % (ref 43–77)
Platelets: 263 10*3/uL (ref 150–400)
RBC: 4.84 MIL/uL (ref 4.22–5.81)
WBC: 6.3 10*3/uL (ref 4.0–10.5)

## 2012-11-27 LAB — CREATININE, SERUM: GFR calc Af Amer: 82 mL/min — ABNORMAL LOW (ref >90)

## 2012-11-27 MED ORDER — LORAZEPAM 1 MG PO TABS: 1.0000 mg | ORAL_TABLET | Freq: Two times a day (BID) | ORAL | Status: DC | PRN

## 2012-11-27 MED ORDER — SODIUM CHLORIDE 0.9 % IV SOLN
1000.0000 mL | INTRAVENOUS | Status: DC
  Administered 2012-11-27: 1000 mL via INTRAVENOUS

## 2012-11-27 MED ORDER — SODIUM CHLORIDE 0.9 % IJ SOLN: 3.0000 mL | Freq: Two times a day (BID) | INTRAMUSCULAR | Status: DC

## 2012-11-27 MED ORDER — CYCLOSPORINE 0.05 % OP EMUL
1.0000 [drp] | Freq: Two times a day (BID) | OPHTHALMIC | Status: DC
  Administered 2012-11-27 (×2): 1 [drp] via OPHTHALMIC
  Filled 2012-11-27 (×4): qty 1

## 2012-11-27 MED ORDER — SODIUM CHLORIDE 0.9 % IJ SOLN: 3.0000 mL | INTRAMUSCULAR | Status: DC | PRN

## 2012-11-27 MED ORDER — OXYCODONE HCL 5 MG PO TABS: 5.0000 mg | ORAL_TABLET | ORAL | Status: DC | PRN

## 2012-11-27 MED ORDER — ALBUTEROL SULFATE (5 MG/ML) 0.5% IN NEBU: 2.5000 mg | INHALATION_SOLUTION | RESPIRATORY_TRACT | Status: DC | PRN

## 2012-11-27 MED ORDER — SODIUM CHLORIDE 0.9 % IJ SOLN
3.0000 mL | Freq: Two times a day (BID) | INTRAMUSCULAR | Status: DC
  Administered 2012-11-27 – 2012-11-28 (×2): 3 mL via INTRAVENOUS

## 2012-11-27 MED ORDER — CITALOPRAM HYDROBROMIDE 20 MG PO TABS
20.0000 mg | ORAL_TABLET | Freq: Every day | ORAL | Status: DC
  Administered 2012-11-27 – 2012-11-28 (×2): 20 mg via ORAL
  Filled 2012-11-27 (×2): qty 1

## 2012-11-27 MED ORDER — SODIUM CHLORIDE 0.9 % IV SOLN
1000.0000 mL | Freq: Once | INTRAVENOUS | Status: AC
  Administered 2012-11-27: 1000 mL via INTRAVENOUS

## 2012-11-27 MED ORDER — SODIUM CHLORIDE 0.9 % IV SOLN: 250.0000 mL | INTRAVENOUS | Status: DC | PRN

## 2012-11-27 MED ORDER — ASPIRIN EC 81 MG PO TBEC
81.0000 mg | DELAYED_RELEASE_TABLET | Freq: Every day | ORAL | Status: DC
  Administered 2012-11-27 – 2012-11-28 (×2): 81 mg via ORAL
  Filled 2012-11-27 (×2): qty 1

## 2012-11-27 MED ORDER — ONDANSETRON HCL 4 MG/2ML IJ SOLN: 4.0000 mg | Freq: Four times a day (QID) | INTRAMUSCULAR | Status: DC | PRN

## 2012-11-27 MED ORDER — ACETAMINOPHEN 325 MG PO TABS: 650.0000 mg | ORAL_TABLET | Freq: Four times a day (QID) | ORAL | Status: DC | PRN

## 2012-11-27 MED ORDER — ACETAMINOPHEN 650 MG RE SUPP: 650.0000 mg | Freq: Four times a day (QID) | RECTAL | Status: DC | PRN

## 2012-11-27 MED ORDER — ENOXAPARIN SODIUM 40 MG/0.4ML ~~LOC~~ SOLN
40.0000 mg | SUBCUTANEOUS | Status: DC
  Administered 2012-11-27 – 2012-11-28 (×2): 40 mg via SUBCUTANEOUS
  Filled 2012-11-27 (×2): qty 0.4

## 2012-11-27 MED ORDER — ALUM & MAG HYDROXIDE-SIMETH 200-200-20 MG/5ML PO SUSP: 30.0000 mL | Freq: Four times a day (QID) | ORAL | Status: DC | PRN

## 2012-11-27 MED ORDER — ONDANSETRON HCL 4 MG PO TABS: 4.0000 mg | ORAL_TABLET | Freq: Four times a day (QID) | ORAL | Status: DC | PRN
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
## 2012-11-28 LAB — BASIC METABOLIC PANEL
BUN: 17 mg/dL (ref 6–23)
CO2: 26 mEq/L (ref 19–32)
Calcium: 8.7 mg/dL (ref 8.4–10.5)
Creatinine, Ser: 1.16 mg/dL (ref 0.50–1.35)
Glucose, Bld: 94 mg/dL (ref 70–99)

## 2012-11-28 LAB — CBC
HCT: 40.3 % (ref 39.0–52.0)
MCH: 29.8 pg (ref 26.0–34.0)
MCV: 89.8 fL (ref 78.0–100.0)
Platelets: 250 10*3/uL (ref 150–400)
RBC: 4.49 MIL/uL (ref 4.22–5.81)

## 2012-11-28 LAB — TROPONIN I: Troponin I: <0.3 ng/mL (ref <0.30)

## 2012-11-28 NOTE — Progress Notes (Signed)
Pt and wife given d/c instructions; pt verbalized understanding; no concerns voiced; pt to d/c home with wife; will cont. To monitor.

## 2012-11-28 NOTE — Progress Notes (Signed)
Echocardiogram 2D Echocardiogram has been performed.  Cristian Gonzales 11/28/2012, 12:08 PM

## 2012-11-28 NOTE — Progress Notes (Signed)
Pt to d/c home with wife; IV and tele monitor d/c at this time; awaiting AVS; will cont. To monitor.
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
## 2012-12-06 ENCOUNTER — Encounter: Payer: Self-pay | Admitting: Family

## 2012-12-06 ENCOUNTER — Ambulatory Visit (INDEPENDENT_AMBULATORY_CARE_PROVIDER_SITE_OTHER): Payer: 59 | Admitting: Family

## 2012-12-06 VITALS — BP 110/80 | HR 85 | Wt 233.0 lb

## 2012-12-06 DIAGNOSIS — R55 Syncope and collapse: Secondary | ICD-10-CM

## 2012-12-06 DIAGNOSIS — F411 Generalized anxiety disorder: Secondary | ICD-10-CM

## 2012-12-06 NOTE — Patient Instructions (Signed)
Syncope Syncope is a fainting spell. This means the person loses consciousness and drops to the ground. The person is generally unconscious for less than 5 minutes. The person may have some muscle twitches for up to 15 seconds before waking up and returning to normal. Syncope occurs more often in elderly people, but it can happen to anyone. While most causes of syncope are not dangerous, syncope can be a sign of a serious medical problem. It is important to seek medical care.  CAUSES  Syncope is caused by a sudden decrease in blood flow to the brain. The specific cause is often not determined. Factors that can trigger syncope include:  Taking medicines that lower blood pressure.  Sudden changes in posture, such as standing up suddenly.  Taking more medicine than prescribed.  Standing in one place for too long.  Seizure disorders.  Dehydration and excessive exposure to heat.  Low blood sugar (hypoglycemia).  Straining to have a bowel movement.  Heart disease, irregular heartbeat, or other circulatory problems.  Fear, emotional distress, seeing blood, or severe pain. SYMPTOMS  Right before fainting, you may:  Feel dizzy or lightheaded.  Feel nauseous.  See all white or all black in your field of vision.  Have cold, clammy skin. DIAGNOSIS  Your caregiver will ask about your symptoms, perform a physical exam, and perform electrocardiography (ECG) to record the electrical activity of your heart. Your caregiver may also perform other heart or blood tests to determine the cause of your syncope. TREATMENT  In most cases, no treatment is needed. Depending on the cause of your syncope, your caregiver may recommend changing or stopping some of your medicines. HOME CARE INSTRUCTIONS  Have someone stay with you until you feel stable.  Do not drive, operate machinery, or play sports until your caregiver says it is okay.  Keep all follow-up appointments as directed by your  caregiver.  Lie down right away if you start feeling like you might faint. Breathe deeply and steadily. Wait until all the symptoms have passed.  Drink enough fluids to keep your urine clear or pale yellow.  If you are taking blood pressure or heart medicine, get up slowly, taking several minutes to sit and then stand. This can reduce dizziness. SEEK IMMEDIATE MEDICAL CARE IF:   You have a severe headache.  You have unusual pain in the chest, abdomen, or back.  You are bleeding from the mouth or rectum, or you have black or tarry stool.  You have an irregular or very fast heartbeat.  You have pain with breathing.  You have repeated fainting or seizure-like jerking during an episode.  You faint when sitting or lying down.  You have confusion.  You have difficulty walking.  You have severe weakness.  You have vision problems. If you fainted, call your local emergency services (911 in [place] Do not drive yourself to the hospital.  MAKE SURE YOU:  Understand these instructions.  Will watch your condition.  Will get help right away if you are not doing well or get worse. Document Released: 08/23/2005 Document Revised: 02/22/2012 Document Reviewed: 10/22/2011 [employer] Patient Information 2013 [employer].  
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
## 2013-01-24 ENCOUNTER — Other Ambulatory Visit: Payer: Self-pay | Admitting: Family Medicine

## 2013-03-12 ENCOUNTER — Other Ambulatory Visit: Payer: Self-pay | Admitting: Family Medicine

## 2013-06-04 ENCOUNTER — Ambulatory Visit (INDEPENDENT_AMBULATORY_CARE_PROVIDER_SITE_OTHER): Payer: 59 | Admitting: Family Medicine

## 2013-06-04 ENCOUNTER — Encounter: Payer: Self-pay | Admitting: Family Medicine

## 2013-06-04 VITALS — BP 122/70 | Temp 99.0°F | Wt 241.0 lb

## 2013-06-04 DIAGNOSIS — M545 Low back pain, unspecified: Secondary | ICD-10-CM

## 2013-06-04 DIAGNOSIS — F329 Major depressive disorder, single episode, unspecified: Secondary | ICD-10-CM

## 2013-06-04 DIAGNOSIS — G8929 Other chronic pain: Secondary | ICD-10-CM

## 2013-06-04 DIAGNOSIS — M542 Cervicalgia: Secondary | ICD-10-CM

## 2013-06-04 DIAGNOSIS — M5410 Radiculopathy, site unspecified: Secondary | ICD-10-CM

## 2013-06-04 DIAGNOSIS — IMO0002 Reserved for concepts with insufficient information to code with codable children: Secondary | ICD-10-CM

## 2013-06-04 DIAGNOSIS — F32A Depression, unspecified: Secondary | ICD-10-CM

## 2013-06-04 MED ORDER — PREDNISONE 20 MG PO TABS: 40.0000 mg | ORAL_TABLET | Freq: Every day | ORAL | Status: DC
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
## 2013-06-04 NOTE — Patient Instructions (Addendum)
-  heat for 15 minutes twice daily  -exercises provided at least 4 times per week and back of on weight lifting for a few weeks until reevaluated by your doctor - proper lifting mechanics at work  -As we discussed, we have prescribed a new medication (PREDNISONE) for you at this appointment. We discussed the common and serious potential adverse effects of this medication and you can review these and more with the pharmacist when you pick up your medication.  Please follow the instructions for use carefully and notify us immediately if you have any problems taking this medication.  -tylenol 500-1000mg  up to 3 times daily if needed for pain  -take you antidepressant daily as prescribed by your doctor  -follow up with Dr. Tawanna Cooler in 3-4 weeks or sooner if worsening or new symtpoms

## 2013-06-25 ENCOUNTER — Ambulatory Visit (INDEPENDENT_AMBULATORY_CARE_PROVIDER_SITE_OTHER): Payer: 59 | Admitting: Family Medicine

## 2013-06-25 ENCOUNTER — Encounter: Payer: Self-pay | Admitting: Family Medicine

## 2013-06-25 VITALS — BP 120/84 | Temp 98.3°F | Wt 239.0 lb

## 2013-06-25 DIAGNOSIS — R209 Unspecified disturbances of skin sensation: Secondary | ICD-10-CM

## 2013-06-25 DIAGNOSIS — Z23 Encounter for immunization: Secondary | ICD-10-CM

## 2013-06-25 DIAGNOSIS — M545 Low back pain, unspecified: Secondary | ICD-10-CM

## 2013-06-25 DIAGNOSIS — F411 Generalized anxiety disorder: Secondary | ICD-10-CM

## 2013-06-25 DIAGNOSIS — G8929 Other chronic pain: Secondary | ICD-10-CM

## 2013-06-25 DIAGNOSIS — M542 Cervicalgia: Secondary | ICD-10-CM

## 2013-06-25 DIAGNOSIS — R200 Anesthesia of skin: Secondary | ICD-10-CM | POA: Insufficient documentation

## 2013-06-25 MED ORDER — LORAZEPAM 1 MG PO TABS: ORAL_TABLET | ORAL | Status: DC

## 2013-06-25 MED ORDER — HYDROCODONE-ACETAMINOPHEN 7.5-300 MG PO TABS: ORAL_TABLET | ORAL | Status: DC

## 2013-06-25 MED ORDER — PREDNISONE 20 MG PO TABS: ORAL_TABLET | ORAL | Status: DC

## 2013-06-25 NOTE — Progress Notes (Signed)
  Subjective:    Patient ID: Cristian Gonzales, male    DOB: 1956/07/13, 57 y.o.   MRN: [id number]  HPI  Cristian Gonzales is a 57 year old male who comes in today for evaluation of pain and numbness of his arms and legs  He says for the last 2 months she's had pain and numbness in both arms and legs. It's worse in the left leg and left arm. He saw Dr. Selena Batten in September neurologic examination that time was negative. She gave him prednisone for 5 days. His pain went from a 5 do is 0. We stopped the prednisone the pain came back. He describes the pain as constant sharp and involves the left arm and left leg. He's had a cervical neck surgery in 2004 by Dr. Channing Mutters.  He's had no fever chills nausea vomiting diarrhea or night sweats. No bowel nor bladder dysfunction   Review of Systems    review of systems negative except for anxiety and depression which he is treated with Ativan. He says antidepressants does seem to help Objective:   Physical Exam  Well-developed well-nourished male no acute distress examination of her extremities shows both arms appear normal no muscle wasting sensation strength reflexes symmetrical normal lower extremity the same negative straight leg raising      Assessment & Plan:  Pain and numbness left arm and left leg refer back to Dr. Channing Mutters for further evaluation.  Anxiety/depression Ativan until we get the neck and back problems resolved at that juncture we might want to get Dr. Emerson Monte involved

## 2013-06-25 NOTE — Patient Instructions (Signed)
Restart the prednisone as directed  Ativan one twice daily  Vicodin ES............ one half to one tablet twice daily when necessary for severe pain  Call Dr. Rolan Bucco office today and set up a consult with him ASAP for further evaluation

## 2013-07-02 ENCOUNTER — Telehealth: Payer: Self-pay | Admitting: Family Medicine

## 2013-07-02 NOTE — Telephone Encounter (Signed)
Pt states Dr. Tawanna Cooler asked him to call [facility] to schedule a consult appt.  After calling their facility pt was informed they need records faxed over to [phone] attn: Chrissy.  Office Tel [phone]

## 2013-07-31 ENCOUNTER — Other Ambulatory Visit (HOSPITAL_COMMUNITY): Payer: Self-pay | Admitting: Neurosurgery

## 2013-07-31 DIAGNOSIS — M5412 Radiculopathy, cervical region: Secondary | ICD-10-CM

## 2013-08-13 ENCOUNTER — Other Ambulatory Visit: Payer: Self-pay | Admitting: Family Medicine

## 2013-10-24 ENCOUNTER — Telehealth: Payer: Self-pay | Admitting: Family Medicine

## 2013-10-24 MED ORDER — CITALOPRAM HYDROBROMIDE 20 MG PO TABS: ORAL_TABLET | ORAL | Status: DC

## 2013-10-24 NOTE — Telephone Encounter (Signed)
Pt is needing new rx citalopram (CELEXA) 20 MG tablet, sent to [facility] [address].

## 2013-12-01 ENCOUNTER — Other Ambulatory Visit: Payer: Self-pay | Admitting: Family Medicine

## 2014-01-23 ENCOUNTER — Encounter: Payer: Self-pay | Admitting: Physician Assistant

## 2014-01-23 ENCOUNTER — Ambulatory Visit (INDEPENDENT_AMBULATORY_CARE_PROVIDER_SITE_OTHER): Payer: Managed Care, Other (non HMO) | Admitting: Physician Assistant

## 2014-01-23 VITALS — BP 122/80 | HR 83 | Temp 98.1°F | Resp 18 | Wt 244.0 lb

## 2014-01-23 DIAGNOSIS — G4700 Insomnia, unspecified: Secondary | ICD-10-CM

## 2014-01-23 DIAGNOSIS — F411 Generalized anxiety disorder: Secondary | ICD-10-CM

## 2014-01-23 MED ORDER — CLONAZEPAM 0.5 MG PO TABS: 0.5000 mg | ORAL_TABLET | Freq: Every day | ORAL | Status: DC

## 2014-01-23 MED ORDER — SERTRALINE HCL 50 MG PO TABS: ORAL_TABLET | ORAL | Status: DC

## 2014-01-23 NOTE — Progress Notes (Signed)
Pre visit review using our clinic review tool, if applicable. No additional management support is needed unless otherwise documented below in the visit note. 

## 2014-01-23 NOTE — Patient Instructions (Signed)
Sertraline 1 tab per day for the first week, then 2 tabs per day.  Clonazepam 1 tab at night to help you sleep.  Followup in one month to reassess.   Insomnia Insomnia is frequent trouble falling and/or staying asleep. Insomnia can be a long term problem or a short term problem. Both are common. Insomnia can be a short term problem when the wakefulness is related to a certain stress or worry. Long term insomnia is often related to ongoing stress during waking hours and/or poor sleeping habits. Overtime, sleep deprivation itself can make the problem worse. Every little thing feels more severe because you are overtired and your ability to cope is decreased. CAUSES   Stress, anxiety, and depression.  Poor sleeping habits.  Distractions such as TV in the bedroom.  Naps close to bedtime.  Engaging in emotionally charged conversations before bed.  Technical reading before sleep.  Alcohol and other sedatives. They may make the problem worse. They can hurt normal sleep patterns and normal dream activity.  Stimulants such as caffeine for several hours prior to bedtime.  Pain syndromes and shortness of breath can cause insomnia.  Exercise late at night.  Changing time zones may cause sleeping problems (jet lag). It is sometimes helpful to have someone observe your sleeping patterns. They should look for periods of not breathing during the night (sleep apnea). They should also look to see how long those periods last. If you live alone or observers are uncertain, you can also be observed at a sleep clinic where your sleep patterns will be professionally monitored. Sleep apnea requires a checkup and treatment. Give your caregivers your medical history. Give your caregivers observations your family has made about your sleep.  SYMPTOMS   Not feeling rested in the morning.  Anxiety and restlessness at bedtime.  Difficulty falling and staying asleep. TREATMENT   Your caregiver may prescribe  treatment for an underlying medical disorders. Your caregiver can give advice or help if you are using alcohol or other drugs for self-medication. Treatment of underlying problems will usually eliminate insomnia problems.  Medications can be prescribed for short time use. They are generally not recommended for lengthy use.  Over-the-counter sleep medicines are not recommended for lengthy use. They can be habit forming.  You can promote easier sleeping by making lifestyle changes such as:  Using relaxation techniques that help with breathing and reduce muscle tension.  Exercising earlier in the day.  Changing your diet and the time of your last meal. No night time snacks.  Establish a regular time to go to bed.  Counseling can help with stressful problems and worry.  Soothing music and white noise may be helpful if there are background noises you cannot remove.  Stop tedious detailed work at least one hour before bedtime. HOME CARE INSTRUCTIONS   Keep a diary. Inform your caregiver about your progress. This includes any medication side effects. See your caregiver regularly. Take note of:  Times when you are asleep.  Times when you are awake during the night.  The quality of your sleep.  How you feel the next day. This information will help your caregiver care for you.  Get out of bed if you are still awake after 15 minutes. Read or do some quiet activity. Keep the lights down. Wait until you feel sleepy and go back to bed.  Keep regular sleeping and waking hours. Avoid naps.  Exercise regularly.  Avoid distractions at bedtime. Distractions include watching television or engaging  in any intense or detailed activity like attempting to balance the household checkbook.  Develop a bedtime ritual. Keep a familiar routine of bathing, brushing your teeth, climbing into bed at the same time each night, listening to soothing music. Routines increase the success of falling to sleep  faster.  Use relaxation techniques. This can be using breathing and muscle tension release routines. It can also include visualizing peaceful scenes. You can also help control troubling or intruding thoughts by keeping your mind occupied with boring or repetitive thoughts like the old concept of counting sheep. You can make it more creative like imagining planting one beautiful flower after another in your backyard garden.  During your day, work to eliminate stress. When this is not possible use some of the previous suggestions to help reduce the anxiety that accompanies stressful situations. MAKE SURE YOU:   Understand these instructions.  Will watch your condition.  Will get help right away if you are not doing well or get worse. Document Released: 08/20/2000 Document Revised: 11/15/2011 Document Reviewed: 09/20/2007 [facility] Patient Information 2014 [employer], [place].
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
## 2014-02-25 ENCOUNTER — Ambulatory Visit: Payer: Managed Care, Other (non HMO) | Admitting: Physician Assistant

## 2014-02-26 ENCOUNTER — Ambulatory Visit: Payer: Managed Care, Other (non HMO) | Admitting: Family Medicine

## 2014-03-12 ENCOUNTER — Telehealth: Payer: Self-pay | Admitting: Family Medicine

## 2014-03-12 DIAGNOSIS — G4700 Insomnia, unspecified: Secondary | ICD-10-CM

## 2014-03-12 DIAGNOSIS — F411 Generalized anxiety disorder: Secondary | ICD-10-CM

## 2014-03-12 NOTE — Telephone Encounter (Signed)
[id number] - [place], [place] - [address] is requesting 90 day  re-fill on sertraline (ZOLOFT) 50 MG tablet

## 2014-03-13 MED ORDER — SERTRALINE HCL 50 MG PO TABS: ORAL_TABLET | ORAL | Status: DC

## 2014-05-27 ENCOUNTER — Other Ambulatory Visit: Payer: Self-pay | Admitting: Physician Assistant

## 2014-06-05 ENCOUNTER — Encounter: Payer: Self-pay | Admitting: Family Medicine

## 2014-06-05 ENCOUNTER — Ambulatory Visit (INDEPENDENT_AMBULATORY_CARE_PROVIDER_SITE_OTHER): Payer: Managed Care, Other (non HMO) | Admitting: Family Medicine

## 2014-06-05 ENCOUNTER — Ambulatory Visit (INDEPENDENT_AMBULATORY_CARE_PROVIDER_SITE_OTHER)
Admission: RE | Admit: 2014-06-05 | Discharge: 2014-06-05 | Disposition: A | Discharge status: Home / Self Care | Payer: Managed Care, Other (non HMO) | Source: Ambulatory Visit | Attending: Family Medicine | Admitting: Family Medicine

## 2014-06-05 VITALS — BP 120/80 | Temp 98.3°F | Wt 219.0 lb

## 2014-06-05 DIAGNOSIS — M546 Pain in thoracic spine: Secondary | ICD-10-CM

## 2014-06-05 DIAGNOSIS — Z23 Encounter for immunization: Secondary | ICD-10-CM

## 2014-06-05 NOTE — Progress Notes (Signed)
Pre visit review using our clinic review tool, if applicable. No additional management support is needed unless otherwise documented below in the visit note. 

## 2014-06-05 NOTE — Progress Notes (Signed)
   Subjective:    Patient ID: Cristian Gonzales, male    DOB: June 19, 1956, 58 y.o.   MRN: [id number]  HPI Cristian SeniorStephen is a 58 year old male nonsmoker who comes in today with a 3 month history of atraumatic thoracic back pain  He points to T9 area just laterally to the spine on the left as a source of his discomfort. He says as a constant hot and all burning type pain. It does not radiate. It's made worse if he stretches. He said no fever chills nausea vomiting cough weight loss etc. etc. He sleeps well no nighttime pain. Again no history of trauma. 6 he describes the pain as a 2-3 on a scale of 1-10   Review of Systems Review of systems negative    Objective:   Physical Exam  Well-developed and nourished male no acute distress vital signs stable he is afebrile inspection the back appears normal. There is no palpable tenderness in any of the spinal processes. He points to the T9 area and just laterally to the spine as a source of his discomfort. Neurologic exam normal upper extremity      Assessment & Plan:  Thoracic back pain and just lateral to T9...Marland Kitchen.Marland Kitchen.Marland Kitchen. x3 months......Marland Kitchen. probable bone spur...Marland Kitchen.Marland Kitchen. x-ray today

## 2014-06-05 NOTE — Patient Instructions (Signed)
Go to the main office now for x-rays of your spine  We will call you the report

## 2014-06-07 ENCOUNTER — Telehealth: Payer: Self-pay | Admitting: Family Medicine

## 2014-06-07 NOTE — Telephone Encounter (Addendum)
Pt states he is waiting to hear about xrays from wed. pls call on his work number: [phone]

## 2014-06-07 NOTE — Telephone Encounter (Signed)
Spoke with patient.

## 2014-06-07 NOTE — Telephone Encounter (Signed)
Left message on machine for patient to return our call 

## 2014-07-10 ENCOUNTER — Other Ambulatory Visit: Payer: Self-pay | Admitting: Family Medicine

## 2014-07-29 ENCOUNTER — Encounter: Payer: Self-pay | Admitting: Family Medicine

## 2014-07-29 ENCOUNTER — Ambulatory Visit (INDEPENDENT_AMBULATORY_CARE_PROVIDER_SITE_OTHER): Payer: Managed Care, Other (non HMO) | Admitting: Family Medicine

## 2014-07-29 VITALS — BP 130/90 | Temp 99.1°F | Wt 224.0 lb

## 2014-07-29 DIAGNOSIS — M5116 Intervertebral disc disorders with radiculopathy, lumbar region: Secondary | ICD-10-CM

## 2014-07-29 MED ORDER — PREDNISONE 20 MG PO TABS: ORAL_TABLET | ORAL | Status: DC

## 2014-07-29 MED ORDER — CYCLOBENZAPRINE HCL 5 MG PO TABS: 5.0000 mg | ORAL_TABLET | Freq: Three times a day (TID) | ORAL | Status: DC | PRN

## 2014-07-29 MED ORDER — TRAMADOL HCL 50 MG PO TABS: 50.0000 mg | ORAL_TABLET | Freq: Three times a day (TID) | ORAL | Status: DC | PRN

## 2014-07-29 NOTE — Progress Notes (Signed)
Pre visit review using our clinic review tool, if applicable. No additional management support is needed unless otherwise documented below in the visit note. 

## 2014-07-29 NOTE — Progress Notes (Signed)
   Subjective:    Patient ID: Cristian Gonzales, male    DOB: April 07, 1956, 58 y.o.   MRN: [id number]  HPI Cristian Gonzales is a 58 year old male smoker who comes in today for evaluation of acute back pain  He states he was lifting at home yesterday and had the sudden onset of severe back pain. He describes the pain as sharp and constant a 10 on a scale of 1-10. He points to the right and left lumbar area as a source of his pain. He says it radiates down to his hips but no further. He has no bowel or bladder dysfunction  Hent episode like this about 10 years ago was treated symptomatically at an urgent care.   Review of Systems    review of systems otherwise negative Objective:   Physical Exam Well-developed well-nourished male no acute distress,,,,,,,smells  of tobacco  In the supine position both legs were of equal length. Sensation muscle strength reflexes all within normal limits  Straight leg positive bilaterally at 45       Assessment & Plan:  Lumbar disc disease........Marland Kitchen. bedrest for 3 days..... Medication as outlined......Marland Kitchen. prednisone burst and taper.......... no smoking...Marland Kitchen.Marland Kitchen.Marland Kitchen. follow-up in one week

## 2014-07-29 NOTE — Patient Instructions (Signed)
No smoking  Flexeril and tramadol........ one of each 3 times daily while-year-old bedrest for the next 3 days  Starting Thursday just take the Flexeril and tramadol at bedtime  Prednisone as outlined........ take 2 tablets now,,,,,,,, then starting tomorrow take 2 tabs every morning for 3 days then begin to taper as outlined  Drink lots of water  Stool softener  Heating pad........ low heat  Thursday ambulate.......... Walk........... lie down,,,,,,,,,, no sitting  Return on Monday for follow-up

## 2014-08-05 ENCOUNTER — Ambulatory Visit (INDEPENDENT_AMBULATORY_CARE_PROVIDER_SITE_OTHER): Payer: Managed Care, Other (non HMO) | Admitting: Family Medicine

## 2014-08-05 ENCOUNTER — Encounter: Payer: Self-pay | Admitting: Family Medicine

## 2014-08-05 VITALS — BP 120/84 | Temp 98.2°F | Wt 230.0 lb

## 2014-08-05 DIAGNOSIS — M5116 Intervertebral disc disorders with radiculopathy, lumbar region: Secondary | ICD-10-CM

## 2014-08-05 NOTE — Patient Instructions (Signed)
Prednisone 20 mg........ one tablet 3 days, half a tab 3 days, then half a tablet Monday Wednesday Friday for a two-week taper  Flexeril and tramadol........ one half to one of each bedtime  We will get you set up for a physical therapy consult ASAP  Resume your normal activities............... okay to work.... Avoid sitting.

## 2014-08-05 NOTE — Progress Notes (Signed)
   Subjective:    Patient ID: Cristian DillonStephen M Gildersleeve, male    DOB: [birth year], 58 y.o.   MRN: [id number]  HPI [name] is a 58 year old male who comes in today for evaluation of low back pain  We saw him last week with severe bilateral low back pain. Replacement bedrest for 48 hours started him on prednisone 40 mg daily for 3 days and to taper and Flexeril and tramadol twice a day when necessary and he started as well as pain is now markedly diminished. He says when he woke up this morning he was pain-free. Throughout the day it's a little sore on the right lumbar a left lumbar area feels painless   Review of Systems Review of systems otherwise negative    Objective:   Physical Exam Well-developed well-nourished male no acute distress vital signs stable he is afebrile he sits on the examining table and lies down no distress. In the supine position sensation muscle strength reflexes all within normal limits. Straight leg raising +45 on the left but he has extremely tight hamstrings       Assessment & Plan:  Lumbar disc disease improving with medication,,,,,,, taper medication,,,,, resume normal activities,,,,,,,, avoid sitting,,,,,,, medication at bedtime,,,,, taper prednisone,,,,, physical therapy consult

## 2014-08-05 NOTE — Progress Notes (Signed)
Pre visit review using our clinic review tool, if applicable. No additional management support is needed unless otherwise documented below in the visit note. 

## 2014-08-19 ENCOUNTER — Emergency Department (HOSPITAL_COMMUNITY)
Admission: EM | Admit: 2014-08-19 | Discharge: 2014-08-19 | Disposition: A | Discharge status: Home / Self Care | Payer: Managed Care, Other (non HMO) | Attending: Emergency Medicine | Admitting: Emergency Medicine

## 2014-08-19 ENCOUNTER — Emergency Department (HOSPITAL_COMMUNITY): Payer: Managed Care, Other (non HMO)

## 2014-08-19 ENCOUNTER — Encounter (HOSPITAL_COMMUNITY): Payer: Self-pay | Admitting: Emergency Medicine

## 2014-08-19 DIAGNOSIS — Z8781 Personal history of (healed) traumatic fracture: Secondary | ICD-10-CM | POA: Diagnosis not present

## 2014-08-19 DIAGNOSIS — F419 Anxiety disorder, unspecified: Secondary | ICD-10-CM | POA: Diagnosis not present

## 2014-08-19 DIAGNOSIS — Z720 Tobacco use: Secondary | ICD-10-CM | POA: Diagnosis not present

## 2014-08-19 DIAGNOSIS — M1990 Unspecified osteoarthritis, unspecified site: Secondary | ICD-10-CM | POA: Insufficient documentation

## 2014-08-19 DIAGNOSIS — R4781 Slurred speech: Secondary | ICD-10-CM | POA: Diagnosis not present

## 2014-08-19 DIAGNOSIS — F329 Major depressive disorder, single episode, unspecified: Secondary | ICD-10-CM | POA: Diagnosis not present

## 2014-08-19 DIAGNOSIS — R202 Paresthesia of skin: Secondary | ICD-10-CM | POA: Diagnosis not present

## 2014-08-19 DIAGNOSIS — R200 Anesthesia of skin: Secondary | ICD-10-CM

## 2014-08-19 LAB — CBC
HEMATOCRIT: 42.7 % (ref 39.0–52.0)
HEMOGLOBIN: 13.8 g/dL (ref 13.0–17.0)
MCH: 29.1 pg (ref 26.0–34.0)
MCHC: 32.3 g/dL (ref 30.0–36.0)
MCV: 90.1 fL (ref 78.0–100.0)
Platelets: 235 10*3/uL (ref 150–400)
RBC: 4.74 MIL/uL (ref 4.22–5.81)
RDW: 14.2 % (ref 11.5–15.5)
WBC: 9.3 10*3/uL (ref 4.0–10.5)

## 2014-08-19 LAB — COMPREHENSIVE METABOLIC PANEL
ALK PHOS: 81 U/L (ref 39–117)
ALT: 21 U/L (ref 0–53)
AST: 15 U/L (ref 0–37)
Albumin: 3.5 g/dL (ref 3.5–5.2)
Anion gap: 13 (ref 5–15)
BILIRUBIN TOTAL: 0.4 mg/dL (ref 0.3–1.2)
BUN: 16 mg/dL (ref 6–23)
CHLORIDE: 102 meq/L (ref 96–112)
CO2: 23 meq/L (ref 19–32)
Calcium: 8.6 mg/dL (ref 8.4–10.5)
Creatinine, Ser: 0.87 mg/dL (ref 0.50–1.35)
GLUCOSE: 86 mg/dL (ref 70–99)
POTASSIUM: 3.9 meq/L (ref 3.7–5.3)
SODIUM: 138 meq/L (ref 137–147)
Total Protein: 6.5 g/dL (ref 6.0–8.3)

## 2014-08-19 LAB — CBG MONITORING, ED: Glucose-Capillary: 88 mg/dL (ref 70–99)

## 2014-08-19 LAB — ETHANOL: Alcohol, Ethyl (B): <11 mg/dL (ref 0–11)

## 2014-08-19 NOTE — ED Notes (Signed)
Pt reports onset at 0700 with facial numbness, B/L hand numbness, feet numbness, and thick tongue speech. Equal grips B/L.

## 2014-08-19 NOTE — Discharge Instructions (Signed)
Please call your doctor for a followup appointment within 24-48 hours. When you talk to your doctor please let them know that you were seen in the emergency department and have them acquire all of your records so that they can discuss the findings with you and formulate a treatment plan to fully care for your new and ongoing problems. ° °

## 2014-08-19 NOTE — ED Notes (Signed)
Patient transported to CT 
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
## 2015-02-20 ENCOUNTER — Telehealth: Payer: Self-pay | Admitting: Family Medicine

## 2015-02-20 ENCOUNTER — Other Ambulatory Visit: Payer: Self-pay | Admitting: Family Medicine

## 2015-02-20 MED ORDER — LORAZEPAM 1 MG PO TABS: ORAL_TABLET | ORAL | Status: DC

## 2015-02-20 NOTE — Telephone Encounter (Signed)
Pt request refill LORazepam (ATIVAN) 1 MG tablet  [facility] [address]  Pt has been out for a week and states he called last week to pharm and had not heard.

## 2015-02-20 NOTE — Telephone Encounter (Signed)
Rx called in 

## 2015-06-12 ENCOUNTER — Other Ambulatory Visit: Payer: Self-pay | Admitting: Family Medicine

## 2015-06-17 ENCOUNTER — Encounter: Payer: Self-pay | Admitting: Adult Health

## 2015-06-17 ENCOUNTER — Ambulatory Visit (INDEPENDENT_AMBULATORY_CARE_PROVIDER_SITE_OTHER): Payer: Managed Care, Other (non HMO) | Admitting: Adult Health

## 2015-06-17 VITALS — BP 110/64 | Temp 98.6°F | Ht 64.5 in | Wt 229.0 lb

## 2015-06-17 DIAGNOSIS — Z7689 Persons encountering health services in other specified circumstances: Secondary | ICD-10-CM

## 2015-06-17 DIAGNOSIS — Z7189 Other specified counseling: Secondary | ICD-10-CM

## 2015-06-17 DIAGNOSIS — Z23 Encounter for immunization: Secondary | ICD-10-CM | POA: Diagnosis not present

## 2015-06-17 NOTE — Patient Instructions (Addendum)
It was great meeting you today!  Follow up for your complete physical   Please let me know if you need anything in the meantime.   Health Maintenance, Male A healthy lifestyle and preventative care can promote health and wellness.  Maintain regular health, dental, and eye exams.  Eat a healthy diet. Foods like vegetables, fruits, whole grains, low-fat dairy products, and lean protein foods contain the nutrients you need and are low in calories. Decrease your intake of foods high in solid fats, added sugars, and salt. Get information about a proper diet from your health care provider, if necessary.  Regular physical exercise is one of the most important things you can do for your health. Most adults should get at least 150 minutes of moderate-intensity exercise (any activity that increases your heart rate and causes you to sweat) each week. In addition, most adults need muscle-strengthening exercises on 2 or more days a week.   Maintain a healthy weight. The body mass index (BMI) is a screening tool to identify possible weight problems. It provides an estimate of body fat based on height and weight. Your health care provider can find your BMI and can help you achieve or maintain a healthy weight. For males 20 years and older:  A BMI below 18.5 is considered underweight.  A BMI of 18.5 to 24.9 is normal.  A BMI of 25 to 29.9 is considered overweight.  A BMI of 30 and above is considered obese.  Maintain normal blood lipids and cholesterol by exercising and minimizing your intake of saturated fat. Eat a balanced diet with plenty of fruits and vegetables. Blood tests for lipids and cholesterol should begin at age 48 and be repeated every 5 years. If your lipid or cholesterol levels are high, you are over age 21, or you are at high risk for heart disease, you may need your cholesterol levels checked more frequently.Ongoing high lipid and cholesterol levels should be treated with medicines if  diet and exercise are not working.  If you smoke, find out from your health care provider how to quit. If you do not use tobacco, do not start.  Lung cancer screening is recommended for adults aged 55-80 years who are at high risk for developing lung cancer because of a history of smoking. A yearly low-dose CT scan of the lungs is recommended for people who have at least a 30-pack-year history of smoking and are current smokers or have quit within the past 15 years. A pack year of smoking is smoking an average of 1 pack of cigarettes a day for 1 year (for example, a 30-pack-year history of smoking could mean smoking 1 pack a day for 30 years or 2 packs a day for 15 years). Yearly screening should continue until the smoker has stopped smoking for at least 15 years. Yearly screening should be stopped for people who develop a health problem that would prevent them from having lung cancer treatment.  If you choose to drink alcohol, do not have more than 2 drinks per day. One drink is considered to be 12 oz (360 mL) of beer, 5 oz (150 mL) of wine, or 1.5 oz (45 mL) of liquor.  Avoid the use of street drugs. Do not share needles with anyone. Ask for help if you need support or instructions about stopping the use of drugs.  High blood pressure causes heart disease and increases the risk of stroke. High blood pressure is more likely to develop in:  People  who have blood pressure in the end of the normal range (100-139/85-89 mm Hg).  People who are overweight or obese.  People who are African American.  If you are 40-44 years of age, have your blood pressure checked every 3-5 years. If you are 66 years of age or older, have your blood pressure checked every year. You should have your blood pressure measured twice--once when you are at a hospital or clinic, and once when you are not at a hospital or clinic. Record the average of the two measurements. To check your blood pressure when you are not at a  hospital or clinic, you can use:  An automated blood pressure machine at a pharmacy.  A home blood pressure monitor.  If you are 51-13 years old, ask your health care provider if you should take aspirin to prevent heart disease.  Diabetes screening involves taking a blood sample to check your fasting blood sugar level. This should be done once every 3 years after age 67 if you are at a normal weight and without risk factors for diabetes. Testing should be considered at a younger age or be carried out more frequently if you are overweight and have at least 1 risk factor for diabetes.  Colorectal cancer can be detected and often prevented. Most routine colorectal cancer screening begins at the age of 73 and continues through age 80. However, your health care provider may recommend screening at an earlier age if you have risk factors for colon cancer. On a yearly basis, your health care provider may provide home test kits to check for hidden blood in the stool. A small camera at the end of a tube may be used to directly examine the colon (sigmoidoscopy or colonoscopy) to detect the earliest forms of colorectal cancer. Talk to your health care provider about this at age 81 when routine screening begins. A direct exam of the colon should be repeated every 5-10 years through age 21, unless early forms of precancerous polyps or small growths are found.  People who are at an increased risk for hepatitis B should be screened for this virus. You are considered at high risk for hepatitis B if:  You were born in a country where hepatitis B occurs often. Talk with your health care provider about which countries are considered high risk.  Your parents were born in a high-risk country and you have not received a shot to protect against hepatitis B (hepatitis B vaccine).  You have HIV or AIDS.  You use needles to inject street drugs.  You live with, or have sex with, someone who has hepatitis B.  You are a  man who has sex with other men (MSM).  You get hemodialysis treatment.  You take certain medicines for conditions like cancer, organ transplantation, and autoimmune conditions.  Hepatitis C blood testing is recommended for all people born from [birth year] through 1965 and any individual with known risk factors for hepatitis C.  Healthy men should no longer receive prostate-specific antigen (PSA) blood tests as part of routine cancer screening. Talk to your health care provider about prostate cancer screening.  Testicular cancer screening is not recommended for adolescents or adult males who have no symptoms. Screening includes self-exam, a health care provider exam, and other screening tests. Consult with your health care provider about any symptoms you have or any concerns you have about testicular cancer.  Practice safe sex. Use condoms and avoid high-risk sexual practices to reduce the spread of sexually  transmitted infections (STIs).  You should be screened for STIs, including gonorrhea and chlamydia if:  You are sexually active and are younger than 24 years.  You are older than 24 years, and your health care provider tells you that you are at risk for this type of infection.  Your sexual activity has changed since you were last screened, and you are at an increased risk for chlamydia or gonorrhea. Ask your health care provider if you are at risk.  If you are at risk of being infected with HIV, it is recommended that you take a prescription medicine daily to prevent HIV infection. This is called pre-exposure prophylaxis (PrEP). You are considered at risk if:  You are a man who has sex with other men (MSM).  You are a heterosexual man who is sexually active with multiple partners.  You take drugs by injection.  You are sexually active with a partner who has HIV.  Talk with your health care provider about whether you are at high risk of being infected with HIV. If you choose to begin PrEP,  you should first be tested for HIV. You should then be tested every 3 months for as long as you are taking PrEP.  Use sunscreen. Apply sunscreen liberally and repeatedly throughout the day. You should seek shade when your shadow is shorter than you. Protect yourself by wearing long sleeves, pants, a wide-brimmed hat, and sunglasses year round whenever you are outdoors.  Tell your health care provider of new moles or changes in moles, especially if there is a change in shape or color. Also, tell your health care provider if a mole is larger than the size of a pencil eraser.  A one-time screening for abdominal aortic aneurysm (AAA) and surgical repair of large AAAs by ultrasound is recommended for men aged 62-75 years who are current or former smokers.  Stay current with your vaccines (immunizations).   This information is not intended to replace advice given to you by your health care provider. Make sure you discuss any questions you have with your health care provider.   Document Released: 02/19/2008 Document Revised: 09/13/2014 Document Reviewed: 01/18/2011 Elsevier Interactive Patient Education [employer].
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
## 2015-06-17 NOTE — Progress Notes (Signed)
Pre visit review using our clinic review tool, if applicable. No additional management support is needed unless otherwise documented below in the visit note. 

## 2015-07-13 ENCOUNTER — Encounter (HOSPITAL_COMMUNITY): Payer: Self-pay | Admitting: *Deleted

## 2015-07-13 ENCOUNTER — Emergency Department (INDEPENDENT_AMBULATORY_CARE_PROVIDER_SITE_OTHER): Payer: Managed Care, Other (non HMO)

## 2015-07-13 ENCOUNTER — Emergency Department (HOSPITAL_COMMUNITY)
Admission: EM | Admit: 2015-07-13 | Discharge: 2015-07-13 | Disposition: A | Discharge status: Home / Self Care | Payer: Managed Care, Other (non HMO) | Source: Home / Self Care | Attending: Emergency Medicine | Admitting: Emergency Medicine

## 2015-07-13 DIAGNOSIS — S93401A Sprain of unspecified ligament of right ankle, initial encounter: Secondary | ICD-10-CM

## 2015-07-13 DIAGNOSIS — M25471 Effusion, right ankle: Secondary | ICD-10-CM

## 2015-07-13 MED ORDER — DICLOFENAC SODIUM 75 MG PO TBEC: 75.0000 mg | DELAYED_RELEASE_TABLET | Freq: Two times a day (BID) | ORAL | Status: DC
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
## 2015-07-13 NOTE — ED Notes (Signed)
Reports stepping off riding lawnmower when he missed the platform and stepped onto the tire instead, causing him to roll right ankle and fall.  Swelling noted to right lateral ankle.  CMS intact.

## 2015-07-13 NOTE — Discharge Instructions (Signed)
Ice, elevation, follow-up with one of the orthopedic/sports medicine places in a week for physical therapy and ongoing management. Start using a cane to help stay off of your ankle.. Diclofenac as needed for pain. Take this with 1 g of Tylenol.

## 2015-07-25 ENCOUNTER — Other Ambulatory Visit (INDEPENDENT_AMBULATORY_CARE_PROVIDER_SITE_OTHER): Payer: Managed Care, Other (non HMO)

## 2015-07-25 DIAGNOSIS — Z0000 Encounter for general adult medical examination without abnormal findings: Secondary | ICD-10-CM

## 2015-07-25 LAB — CBC WITH DIFFERENTIAL/PLATELET
BASOS PCT: 0.9 % (ref 0.0–3.0)
Basophils Absolute: 0.1 10*3/uL (ref 0.0–0.1)
EOS ABS: 0.3 10*3/uL (ref 0.0–0.7)
EOS PCT: 4.9 % (ref 0.0–5.0)
HEMATOCRIT: 42 % (ref 39.0–52.0)
Hemoglobin: 13.8 g/dL (ref 13.0–17.0)
LYMPHS ABS: 1.6 10*3/uL (ref 0.7–4.0)
Lymphocytes Relative: 24.1 % (ref 12.0–46.0)
MCHC: 32.9 g/dL (ref 30.0–36.0)
MCV: 89.7 fl (ref 78.0–100.0)
Monocytes Absolute: 0.4 10*3/uL (ref 0.1–1.0)
Monocytes Relative: 6.3 % (ref 3.0–12.0)
NEUTROS ABS: 4.3 10*3/uL (ref 1.4–7.7)
NEUTROS PCT: 63.8 % (ref 43.0–77.0)
PLATELETS: 297 10*3/uL (ref 150.0–400.0)
RBC: 4.68 Mil/uL (ref 4.22–5.81)
RDW: 14.1 % (ref 11.5–15.5)
WBC: 6.8 10*3/uL (ref 4.0–10.5)

## 2015-07-25 LAB — HEPATIC FUNCTION PANEL
ALBUMIN: 4.2 g/dL (ref 3.5–5.2)
ALT: 14 U/L (ref 0–53)
AST: 14 U/L (ref 0–37)
Alkaline Phosphatase: 94 U/L (ref 39–117)
BILIRUBIN DIRECT: 0.1 mg/dL (ref 0.0–0.3)
TOTAL PROTEIN: 6.4 g/dL (ref 6.0–8.3)
Total Bilirubin: 0.4 mg/dL (ref 0.2–1.2)

## 2015-07-25 LAB — PSA: PSA: 3.53 ng/mL (ref 0.10–4.00)

## 2015-07-25 LAB — POCT URINALYSIS DIPSTICK
Bilirubin, UA: NEGATIVE
Blood, UA: NEGATIVE
Glucose, UA: NEGATIVE
KETONES UA: NEGATIVE
Leukocytes, UA: NEGATIVE
Nitrite, UA: NEGATIVE
PH UA: 5.5
PROTEIN UA: NEGATIVE
SPEC GRAV UA: 1.025
Urobilinogen, UA: 0.2

## 2015-07-25 LAB — LIPID PANEL
Cholesterol: 211 mg/dL — ABNORMAL HIGH (ref 0–200)
HDL: 43.4 mg/dL (ref >39.00)
LDL Cholesterol: 155 mg/dL — ABNORMAL HIGH (ref 0–99)
NONHDL: 167.15
Total CHOL/HDL Ratio: 5
Triglycerides: 60 mg/dL (ref 0.0–149.0)
VLDL: 12 mg/dL (ref 0.0–40.0)

## 2015-07-25 LAB — BASIC METABOLIC PANEL
BUN: 22 mg/dL (ref 6–23)
CHLORIDE: 106 meq/L (ref 96–112)
CO2: 28 meq/L (ref 19–32)
Calcium: 9.2 mg/dL (ref 8.4–10.5)
Creatinine, Ser: 0.96 mg/dL (ref 0.40–1.50)
GFR: 84.99 mL/min (ref >60.00)
GLUCOSE: 95 mg/dL (ref 70–99)
POTASSIUM: 4.5 meq/L (ref 3.5–5.1)
Sodium: 141 mEq/L (ref 135–145)

## 2015-07-25 LAB — TSH: TSH: 0.85 u[IU]/mL (ref 0.35–4.50)

## 2015-07-30 ENCOUNTER — Ambulatory Visit (INDEPENDENT_AMBULATORY_CARE_PROVIDER_SITE_OTHER): Payer: Managed Care, Other (non HMO) | Admitting: Adult Health

## 2015-07-30 ENCOUNTER — Encounter: Payer: Self-pay | Admitting: Adult Health

## 2015-07-30 VITALS — BP 120/70 | HR 83 | Temp 99.3°F | Ht 64.5 in | Wt 230.9 lb

## 2015-07-30 DIAGNOSIS — Z0000 Encounter for general adult medical examination without abnormal findings: Secondary | ICD-10-CM | POA: Diagnosis not present

## 2015-07-30 DIAGNOSIS — E785 Hyperlipidemia, unspecified: Secondary | ICD-10-CM | POA: Diagnosis not present

## 2015-07-30 MED ORDER — LORAZEPAM 1 MG PO TABS: ORAL_TABLET | ORAL | Status: DC

## 2015-07-30 NOTE — Progress Notes (Signed)
Pre visit review using our clinic review tool, if applicable. No additional management support is needed unless otherwise documented below in the visit note. 

## 2015-07-30 NOTE — Patient Instructions (Addendum)
It was great seeing you again!   Follow up with   [facility]  Address: [address] [place], [place] [address]  [phone]    Work on diet and exercise to help with weight loss and to get your cholesterol down.   Follow up in one year or sooner if needed  Fat and Cholesterol Restricted Diet High levels of fat and cholesterol in your blood may lead to various health problems, such as diseases of the heart, blood vessels, gallbladder, liver, and pancreas. Fats are concentrated sources of energy that come in various forms. Certain types of fat, including saturated fat, may be harmful in excess. Cholesterol is a substance needed by your body in small amounts. Your body makes all the cholesterol it needs. Excess cholesterol comes from the food you eat. When you have high levels of cholesterol and saturated fat in your blood, health problems can develop because the excess fat and cholesterol will gather along the walls of your blood vessels, causing them to narrow. Choosing the right foods will help you control your intake of fat and cholesterol. This will help keep the levels of these substances in your blood within normal limits and reduce your risk of disease. WHAT IS MY PLAN? Your health care provider recommends that you:  Get no more than __________ % of the total calories in your daily diet from fat.  Limit your intake of saturated fat to less than ______% of your total calories each day.  Limit the amount of cholesterol in your diet to less than _________mg per day. WHAT TYPES OF FAT SHOULD I CHOOSE?  Choose healthy fats more often. Choose monounsaturated and polyunsaturated fats, such as olive and canola oil, flaxseeds, walnuts, almonds, and seeds.  Eat more omega-3 fats. Good choices include salmon, mackerel, sardines, tuna, flaxseed oil, and ground flaxseeds. Aim to eat fish at least two times a week.  Limit saturated fats. Saturated fats are  primarily found in animal products, such as meats, butter, and cream. Plant sources of saturated fats include palm oil, palm kernel oil, and coconut oil.  Avoid foods with partially hydrogenated oils in them. These contain trans fats. Examples of foods that contain trans fats are stick margarine, some tub margarines, cookies, crackers, and other baked goods. WHAT GENERAL GUIDELINES DO I NEED TO FOLLOW? These guidelines for healthy eating will help you control your intake of fat and cholesterol:  Check food labels carefully to identify foods with trans fats or high amounts of saturated fat.  Fill one half of your plate with vegetables and green salads.  Fill one fourth of your plate with whole grains. Look for the word "whole" as the first word in the ingredient list.  Fill one fourth of your plate with lean protein foods.  Limit fruit to two servings a day. Choose fruit instead of juice.  Eat more foods that contain soluble fiber. Examples of foods that contain this type of fiber are apples, broccoli, carrots, beans, peas, and barley. Aim to get 20-30 g of fiber per day.  Eat more home-cooked food and less restaurant, buffet, and fast food.  Limit or avoid alcohol.  Limit foods high in starch and sugar.  Limit fried foods.  Cook foods using methods other than frying. Baking, boiling, grilling, and broiling are all great options.  Lose weight if you are overweight. Losing just 5-10% of your initial body weight can help your overall health and prevent diseases such as  diabetes and heart disease. WHAT FOODS CAN I EAT? Grains Whole grains, such as whole wheat or whole grain breads, crackers, cereals, and pasta. Unsweetened oatmeal, bulgur, barley, quinoa, or brown rice. Corn or whole wheat flour tortillas. Vegetables Fresh or frozen vegetables (raw, steamed, roasted, or grilled). Green salads. Fruits All fresh, canned (in natural juice), or frozen fruits. Meat and Other Protein  Products Ground beef (85% or leaner), grass-fed beef, or beef trimmed of fat. Skinless chicken or [place]. Ground chicken or [place]. Pork trimmed of fat. All fish and seafood. Eggs. Dried beans, peas, or lentils. Unsalted nuts or seeds. Unsalted canned or dry beans. Dairy Low-fat dairy products, such as skim or 1% milk, 2% or reduced-fat cheeses, low-fat ricotta or cottage cheese, or plain low-fat yogurt. Fats and Oils Tub margarines without trans fats. Light or reduced-fat mayonnaise and salad dressings. Avocado. Olive, canola, sesame, or safflower oils. Natural peanut or almond butter (choose ones without added sugar and oil). The items listed above may not be a complete list of recommended foods or beverages. Contact your dietitian for more options. WHAT FOODS ARE NOT RECOMMENDED? Grains White bread. White pasta. White rice. Cornbread. Bagels, pastries, and croissants. Crackers that contain trans fat. Vegetables White potatoes. Corn. Creamed or fried vegetables. Vegetables in a cheese sauce. Fruits Dried fruits. Canned fruit in light or heavy syrup. Fruit juice. Meat and Other Protein Products Fatty cuts of meat. Ribs, chicken wings, bacon, sausage, bologna, salami, chitterlings, fatback, hot dogs, bratwurst, and packaged luncheon meats. Liver and organ meats. Dairy Whole or 2% milk, cream, half-and-half, and cream cheese. Whole milk cheeses. Whole-fat or sweetened yogurt. Full-fat cheeses. Nondairy creamers and whipped toppings. Processed cheese, cheese spreads, or cheese curds. Sweets and Desserts Corn syrup, sugars, honey, and molasses. Candy. Jam and jelly. Syrup. Sweetened cereals. Cookies, pies, cakes, donuts, muffins, and ice cream. Fats and Oils Butter, stick margarine, lard, shortening, ghee, or bacon fat. Coconut, palm kernel, or palm oils. Beverages Alcohol. Sweetened drinks (such as sodas, lemonade, and fruit drinks or punches). The items listed above may not be a complete list  of foods and beverages to avoid. Contact your dietitian for more information.   This information is not intended to replace advice given to you by your health care provider. Make sure you discuss any questions you have with your health care provider.   Document Released: 08/23/2005 Document Revised: 09/13/2014 Document Reviewed: 11/21/2013 Elsevier Interactive Patient Education [employer]2016 Elsevier Inc.
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
## 2015-09-11 ENCOUNTER — Other Ambulatory Visit: Payer: Self-pay | Admitting: Family Medicine

## 2015-09-11 NOTE — Telephone Encounter (Signed)
pls advise

## 2015-09-12 NOTE — Telephone Encounter (Signed)
Call in #30 with no refills

## 2015-09-22 ENCOUNTER — Telehealth: Payer: Self-pay | Admitting: Adult Health

## 2015-09-22 NOTE — Telephone Encounter (Signed)
pls advise

## 2015-09-22 NOTE — Telephone Encounter (Signed)
Ok to change order from 30 pills to 60 pills... If he has not had it filled yet. 0 refills.

## 2015-09-22 NOTE — Telephone Encounter (Signed)
Pt received rx  lorazepam #30 instead of #60. The directions  said to take twice a day. [facility]

## 2015-09-23 MED ORDER — LORAZEPAM 1 MG PO TABS: 1.0000 mg | ORAL_TABLET | Freq: Two times a day (BID) | ORAL | Status: DC

## 2015-09-23 NOTE — Telephone Encounter (Signed)
Rx called in to pharmacy. 

## 2015-10-28 ENCOUNTER — Ambulatory Visit (INDEPENDENT_AMBULATORY_CARE_PROVIDER_SITE_OTHER)
Admission: RE | Admit: 2015-10-28 | Discharge: 2015-10-28 | Disposition: A | Discharge status: Home / Self Care | Payer: Managed Care, Other (non HMO) | Source: Ambulatory Visit | Attending: Adult Health | Admitting: Adult Health

## 2015-10-28 ENCOUNTER — Ambulatory Visit (INDEPENDENT_AMBULATORY_CARE_PROVIDER_SITE_OTHER): Payer: Managed Care, Other (non HMO) | Admitting: Adult Health

## 2015-10-28 ENCOUNTER — Encounter: Payer: Self-pay | Admitting: Adult Health

## 2015-10-28 VITALS — BP 126/80 | HR 84 | Temp 100.5°F | Ht 64.5 in | Wt 231.0 lb

## 2015-10-28 DIAGNOSIS — J209 Acute bronchitis, unspecified: Secondary | ICD-10-CM | POA: Diagnosis not present

## 2015-10-28 DIAGNOSIS — R509 Fever, unspecified: Secondary | ICD-10-CM

## 2015-10-28 LAB — POCT INFLUENZA A/B
Influenza A, POC: NEGATIVE
Influenza B, POC: NEGATIVE

## 2015-10-28 MED ORDER — MAGIC MOUTHWASH W/LIDOCAINE: 5.0000 mL | Freq: Three times a day (TID) | ORAL | Status: DC

## 2015-10-28 MED ORDER — AMOXICILLIN-POT CLAVULANATE 875-125 MG PO TABS
1.0000 | ORAL_TABLET | Freq: Two times a day (BID) | ORAL | Status: DC
Start: 2015-10-28 — End: 2016-02-10

## 2015-10-28 MED ORDER — PREDNISONE 10 MG PO TABS: ORAL_TABLET | ORAL | Status: DC

## 2015-10-28 NOTE — Patient Instructions (Signed)
It was great seeing you again, and I am sorry you are feeling so badly.   Your flu swab was negative.   I will follow up with you regarding your chest x ray   Gargle with the magic mouth wash for 15 seconds and then spit
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
## 2015-10-28 NOTE — Progress Notes (Signed)
Pre visit review using our clinic review tool, if applicable. No additional management support is needed unless otherwise documented below in the visit note. 

## 2015-10-30 ENCOUNTER — Other Ambulatory Visit: Payer: Self-pay | Admitting: Adult Health

## 2015-10-30 ENCOUNTER — Other Ambulatory Visit: Payer: Self-pay | Admitting: Family Medicine

## 2015-10-31 NOTE — Telephone Encounter (Signed)
Ok for refill for one month, 60 pills, 0 refills

## 2015-10-31 NOTE — Telephone Encounter (Signed)
Pt last visit 10/28/15 Pt last refill 09/23/15

## 2015-11-06 ENCOUNTER — Telehealth: Payer: Self-pay | Admitting: Adult Health

## 2015-11-06 NOTE — Telephone Encounter (Signed)
Ok to refill 

## 2015-11-06 NOTE — Telephone Encounter (Signed)
Ok to refill. Ativan is twice per day, 60 pills for Ativan

## 2015-11-06 NOTE — Telephone Encounter (Signed)
Pt needs refill on lorazepam and sertraline#30 each w/refills [facility]

## 2015-11-10 MED ORDER — LORAZEPAM 1 MG PO TABS: 1.0000 mg | ORAL_TABLET | Freq: Two times a day (BID) | ORAL | Status: DC

## 2015-11-10 NOTE — Addendum Note (Signed)
Addended by: Azucena FreedMILLNER, Aeron Lheureux C on: 11/10/2015 11:06 AM   Modules accepted: Orders

## 2015-11-10 NOTE — Telephone Encounter (Signed)
Rx sent to pharmacy   

## 2015-12-02 ENCOUNTER — Other Ambulatory Visit: Payer: Self-pay | Admitting: Family Medicine

## 2015-12-08 ENCOUNTER — Telehealth: Payer: Self-pay | Admitting: Adult Health

## 2015-12-08 NOTE — Telephone Encounter (Signed)
Ok to refill 

## 2015-12-08 NOTE — Telephone Encounter (Signed)
OK to refill for one month 

## 2015-12-08 NOTE — Telephone Encounter (Signed)
Pt need new Rx for Ativan 1 mg [facility] [address]  [facility].

## 2015-12-09 ENCOUNTER — Other Ambulatory Visit: Payer: Self-pay

## 2015-12-09 MED ORDER — LORAZEPAM 1 MG PO TABS: 1.0000 mg | ORAL_TABLET | Freq: Two times a day (BID) | ORAL | Status: DC

## 2015-12-09 NOTE — Telephone Encounter (Signed)
Rx printed and faxed.  

## 2015-12-12 NOTE — Telephone Encounter (Signed)
Rx resent to pharmacy

## 2015-12-12 NOTE — Telephone Encounter (Signed)
[facility] LOR[facility]epam (ATIVAN) 1 MG tablet Can you resend?  [facility] [address]

## 2016-01-10 ENCOUNTER — Other Ambulatory Visit: Payer: Self-pay | Admitting: Adult Health

## 2016-01-12 NOTE — Telephone Encounter (Signed)
Ok to refill for one month with 0 refills.

## 2016-02-09 ENCOUNTER — Other Ambulatory Visit: Payer: Self-pay | Admitting: Adult Health

## 2016-02-09 ENCOUNTER — Other Ambulatory Visit: Payer: Self-pay | Admitting: Family Medicine

## 2016-02-09 ENCOUNTER — Telehealth: Payer: Self-pay | Admitting: Adult Health

## 2016-02-09 NOTE — Telephone Encounter (Signed)
Pt request refill of the following:  LORazepam (ATIVAN) 1 MG tablet ,  sertraline (ZOLOFT) 100 MG tablet   Phamacy:   [facility]

## 2016-02-10 ENCOUNTER — Ambulatory Visit (INDEPENDENT_AMBULATORY_CARE_PROVIDER_SITE_OTHER): Payer: Managed Care, Other (non HMO) | Admitting: Adult Health

## 2016-02-10 ENCOUNTER — Other Ambulatory Visit: Payer: Self-pay

## 2016-02-10 ENCOUNTER — Encounter: Payer: Self-pay | Admitting: Adult Health

## 2016-02-10 VITALS — BP 128/80 | Temp 98.1°F | Wt 226.2 lb

## 2016-02-10 DIAGNOSIS — L259 Unspecified contact dermatitis, unspecified cause: Secondary | ICD-10-CM | POA: Diagnosis not present

## 2016-02-10 MED ORDER — METHYLPREDNISOLONE ACETATE 80 MG/ML IJ SUSP
120.0000 mg | Freq: Once | INTRAMUSCULAR | Status: AC
  Administered 2016-02-10: 120 mg via INTRAMUSCULAR

## 2016-02-10 MED ORDER — TRIAMCINOLONE ACETONIDE 0.5 % EX OINT: 1.0000 "application " | TOPICAL_OINTMENT | Freq: Two times a day (BID) | CUTANEOUS | Status: DC

## 2016-02-10 MED ORDER — SERTRALINE HCL 100 MG PO TABS: 100.0000 mg | ORAL_TABLET | Freq: Every day | ORAL | Status: DC

## 2016-02-10 NOTE — Progress Notes (Signed)
   Subjective:    Patient ID: Cristian Gonzales, male    DOB: Apr 09, 1956, 60 y.o.   MRN: [id number]  Rash This is a new problem. The current episode started 1 to 4 weeks ago. The problem is unchanged. The affected locations include the right lowerleg, right ankle, left ankle and right foot. The rash is characterized by itchiness and blistering. He was exposed to plant contact. Pertinent negatives include no fever or joint pain. Past treatments include anti-itch cream. The treatment provided moderate relief.      Review of Systems  Constitutional: Negative for fever.  Musculoskeletal: Negative for joint pain.  Skin: Positive for color change and rash. Negative for pallor and wound.       Objective:   Physical Exam  Constitutional: He is oriented to person, place, and time. He appears well-developed and well-nourished. No distress.  Neurological: He is alert and oriented to person, place, and time.  Skin: Skin is warm and dry. Rash noted. Rash is vesicular. He is not diaphoretic. There is erythema. No pallor.     Psychiatric: He has a normal mood and affect. His behavior is normal. Judgment and thought content normal.  Nursing note and vitals reviewed.      Assessment & Plan:  1. Contact dermatitis - Appears as a contact dermatitis from a plant, likely poison ivy. No crusting noted. Erythremia, and small blisters noted. No signs of infection  - methylPREDNISolone acetate (DEPO-MEDROL) injection 120 mg; Inject 1.5 mLs (120 mg total) into the muscle once. - triamcinolone ointment (KENALOG) 0.5 %; Apply 1 application topically 2 (two) times daily.  Dispense: 30 g; Refill: 1 - Follow up as needed  Shirline Freesory Demitrius Crass, NP

## 2016-02-10 NOTE — Telephone Encounter (Signed)
Ok to refill Ativan for one month   Ok to refill Zoloft for 6 months

## 2016-02-10 NOTE — Telephone Encounter (Signed)
Ok to refill 

## 2016-02-10 NOTE — Patient Instructions (Signed)
You were given an injection of Depo Medrol- this is a steroid   I have sent in a prescription for Kenalog cream, please use this twice a day on the rash  Follow up if no improvement

## 2016-03-05 ENCOUNTER — Ambulatory Visit (INDEPENDENT_AMBULATORY_CARE_PROVIDER_SITE_OTHER): Payer: Managed Care, Other (non HMO) | Admitting: Adult Health

## 2016-03-05 ENCOUNTER — Encounter: Payer: Self-pay | Admitting: Adult Health

## 2016-03-05 VITALS — BP 120/82 | Temp 98.1°F | Ht 64.5 in | Wt 224.9 lb

## 2016-03-05 DIAGNOSIS — M5412 Radiculopathy, cervical region: Secondary | ICD-10-CM | POA: Diagnosis not present

## 2016-03-05 MED ORDER — IBUPROFEN 600 MG PO TABS: 600.0000 mg | ORAL_TABLET | Freq: Three times a day (TID) | ORAL | Status: DC | PRN

## 2016-03-05 MED ORDER — METHYLPREDNISOLONE 4 MG PO TBPK: ORAL_TABLET | ORAL | Status: DC

## 2016-03-05 MED ORDER — TIZANIDINE HCL 4 MG PO TABS: 4.0000 mg | ORAL_TABLET | Freq: Four times a day (QID) | ORAL | Status: DC | PRN
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
## 2016-03-05 NOTE — Patient Instructions (Addendum)
Your exam is consistent with a pinched nerve  I have sent in a prescription for   Zanaflex - muscle relaxer, take at night to see how it makes you feel   Prednisone Dose Pack - Take as directed  Motrin 600mg  - Take every 8 hours for the next 3 days  Heating pad as needed  Follow up if no improvement  Cervical Radiculopathy Cervical radiculopathy happens when a nerve in the neck (cervical nerve) is pinched or bruised. This condition can develop because of an injury or as part of the normal aging process. Pressure on the cervical nerves can cause pain or numbness that runs from the neck all the way down into the arm and fingers. Usually, this condition gets better with rest. Treatment may be needed if the condition does not improve.  CAUSES This condition may be caused by:  Injury.  Slipped (herniated) disk.  Muscle tightness in the neck because of overuse.  Arthritis.  Breakdown or degeneration in the bones and joints of the spine (spondylosis) due to aging.  Bone spurs that may develop near the cervical nerves. SYMPTOMS Symptoms of this condition include:  Pain that runs from the neck to the arm and hand. The pain can be severe or irritating. It may be worse when the neck is moved.  Numbness or weakness in the affected arm and hand. DIAGNOSIS This condition may be diagnosed based on symptoms, medical history, and a physical exam. You may also have tests, including:  X-rays.  CT scan.  MRI.  Electromyogram (EMG).  Nerve conduction tests. TREATMENT In many cases, treatment is not needed for this condition. With rest, the condition usually gets better over time. If treatment is needed, options may include:  Wearing a soft neck collar for short periods of time.  Physical therapy to strengthen your neck muscles.  Medicines, such as NSAIDs, oral corticosteroids, or spinal injections.  Surgery. This may be needed if other treatments do not help. Various types of  surgery may be done depending on the cause of your problems. HOME CARE INSTRUCTIONS Managing Pain  Take over-the-counter and prescription medicines only as told by your health care provider.  If directed, apply ice to the affected area.  Put ice in a plastic bag.  Place a towel between your skin and the bag.  Leave the ice on for 20 minutes, 2-3 times per day.  If ice does not help, you can try using heat. Take a warm shower or warm bath, or use a heat pack as told by your health care provider.  Try a gentle neck and shoulder massage to help relieve symptoms. Activity  Rest as needed. Follow instructions from your health care provider about any restrictions on activities.  Do stretching and strengthening exercises as told by your health care provider or physical therapist. General Instructions  If you were given a soft collar, wear it as told by your health care provider.  Use a flat pillow when you sleep.  Keep all follow-up visits as told by your health care provider. This is important. SEEK MEDICAL CARE IF:  Your condition does not improve with treatment. SEEK IMMEDIATE MEDICAL CARE IF:  Your pain gets much worse and cannot be controlled with medicines.  You have weakness or numbness in your hand, arm, face, or leg.  You have a high fever.  You have a stiff, rigid neck.  You lose control of your bowels or your bladder (have incontinence).  You have trouble with walking,  balance, or speaking.   This information is not intended to replace advice given to you by your health care provider. Make sure you discuss any questions you have with your health care provider.   Document Released: 05/18/2001 Document Revised: 05/14/2015 Document Reviewed: 10/17/2014 Elsevier Interactive Patient Education [employer].

## 2016-03-15 ENCOUNTER — Other Ambulatory Visit: Payer: Self-pay | Admitting: Adult Health

## 2016-03-15 NOTE — Telephone Encounter (Signed)
Ok to refill for one month  

## 2016-03-16 NOTE — Telephone Encounter (Signed)
Last refilled 02/10/16 for #60 with 0 refills.  Last OV 03/05/16. Ok to refill?

## 2016-03-16 NOTE — Telephone Encounter (Signed)
[facility]

## 2016-04-13 ENCOUNTER — Ambulatory Visit (INDEPENDENT_AMBULATORY_CARE_PROVIDER_SITE_OTHER): Payer: Managed Care, Other (non HMO) | Admitting: Adult Health

## 2016-04-13 ENCOUNTER — Encounter: Payer: Self-pay | Admitting: Adult Health

## 2016-04-13 VITALS — BP 128/76 | HR 89 | Temp 98.0°F | Ht 64.5 in | Wt 229.2 lb

## 2016-04-13 DIAGNOSIS — M5116 Intervertebral disc disorders with radiculopathy, lumbar region: Secondary | ICD-10-CM

## 2016-04-13 DIAGNOSIS — F411 Generalized anxiety disorder: Secondary | ICD-10-CM | POA: Diagnosis not present

## 2016-04-13 MED ORDER — PREDNISONE 20 MG PO TABS: 40.0000 mg | ORAL_TABLET | Freq: Every day | ORAL | 0 refills | Status: AC

## 2016-04-13 MED ORDER — LORAZEPAM 1 MG PO TABS: 1.0000 mg | ORAL_TABLET | Freq: Two times a day (BID) | ORAL | 0 refills | Status: DC

## 2016-04-13 MED ORDER — MELOXICAM 15 MG PO TABS: 15.0000 mg | ORAL_TABLET | Freq: Every day | ORAL | 0 refills | Status: DC

## 2016-04-13 NOTE — Progress Notes (Signed)
Pre visit review using our clinic review tool, if applicable. No additional management support is needed unless otherwise documented below in the visit note. 

## 2016-04-13 NOTE — Progress Notes (Signed)
Subjective:    Patient ID: Cristian Gonzales, male    DOB: 02/04/1956, 60 y.o.   MRN: [id number]  HPI  [age] year old male who presents to the office today for worsening low back pain with numbness in bilateral thighs. He reports that about a week and a half ago he got out of the car at the [employer] and about fell over due to the numbness in bilateral legs. He states " it feels like I have sand bags on both of my legs. When I stand for a long time at work my legs become numb and then when I sit down and try to get back up I almost fall over."   Denies any saddle paresthesia or issues with bowel or bladder  He has a history of lumbar disk disease with radiculopathy. He has had steroid injections in the past and reports that this helped tremendously.   He had a thoracic x ray done in 05/2014 which was negative for acute finding and did show mild appearing degenerative disease.   Cervical spine x ray in 2009 showed IMPRESSION:   1.  No acute fracture or subluxation. 2.  C6-7 anterior cervical fusion. 3.  C4-5 spondylosis.  He has been using Motrin at home without any relief.    In addition, he also needs a refill of Ativan   Review of Systems  Constitutional: Positive for activity change.  HENT: Negative.   Respiratory: Negative.   Cardiovascular: Negative.   Musculoskeletal: Positive for arthralgias and back pain. Negative for gait problem and joint swelling.  Skin: Negative.   Neurological: Positive for weakness and numbness. Negative for tremors, syncope and speech difficulty.  All other systems reviewed and are negative.      Objective:   Physical Exam  Constitutional: He is oriented to person, place, and time. He appears well-developed and well-nourished. No distress.  Cardiovascular: Normal rate, regular rhythm, normal heart sounds and intact distal pulses.  Exam reveals no gallop and no friction rub.   No murmur heard. Pulmonary/Chest: Effort normal and breath sounds  normal. No respiratory distress. He has no wheezes. He has no rales. He exhibits no tenderness.  Musculoskeletal: Normal range of motion. He exhibits no edema, tenderness or deformity.  Has full sensation in lower extremities. No loss of strength. Stands from chair slowly and appears in pain when doing so  Neurological: He is alert and oriented to person, place, and time. He has normal reflexes. He displays normal reflexes. No cranial nerve deficit. He exhibits normal muscle tone. Coordination normal.  Skin: Skin is warm and dry. No rash noted. He is not diaphoretic. No erythema. No pallor.  Psychiatric: He has a normal mood and affect. His behavior is normal. Judgment and thought content normal.  Nursing note and vitals reviewed.     Assessment & Plan:  1. Lumbar disc disease with radiculopathy - meloxicam (MOBIC) 15 MG tablet; Take 1 tablet (15 mg total) by mouth daily.  Dispense: 30 tablet; Refill: 0 - DG Thoracic Spine W/Swimmers; Future - DG Lumbar Spine Complete; Future - predniSONE (DELTASONE) 20 MG tablet; Take 2 tablets (40 mg total) by mouth daily with breakfast.  Dispense: 14 tablet; Refill: 0 - AMB referral to orthopedics - follow up if no improvement or go to the ER if symptoms worsen   2. Anxiety state - LORazepam (ATIVAN) 1 MG tablet; Take 1 tablet (1 mg total) by mouth 2 (two) times daily.  Dispense: 60 tablet; Refill: 0  [name], NP

## 2016-04-13 NOTE — Patient Instructions (Signed)
I have sent in a prescription for Prednisone and Mobic. Take these as directed  [facility] and I will follow up with you.   I will also refer you to orthopedics

## 2016-04-15 ENCOUNTER — Ambulatory Visit (INDEPENDENT_AMBULATORY_CARE_PROVIDER_SITE_OTHER)
Admission: RE | Admit: 2016-04-15 | Discharge: 2016-04-15 | Disposition: A | Discharge status: Home / Self Care | Payer: Managed Care, Other (non HMO) | Source: Ambulatory Visit | Attending: Adult Health | Admitting: Adult Health

## 2016-04-15 DIAGNOSIS — M5116 Intervertebral disc disorders with radiculopathy, lumbar region: Secondary | ICD-10-CM

## 2016-04-20 ENCOUNTER — Other Ambulatory Visit: Payer: Self-pay | Admitting: Adult Health

## 2016-04-20 ENCOUNTER — Telehealth: Payer: Self-pay | Admitting: Adult Health

## 2016-04-20 MED ORDER — OXYCODONE HCL 5 MG PO TABS: 5.0000 mg | ORAL_TABLET | Freq: Four times a day (QID) | ORAL | 0 refills | Status: DC | PRN

## 2016-04-20 NOTE — Telephone Encounter (Signed)
Notified that Rx is ready to be picked up

## 2016-04-20 NOTE — Telephone Encounter (Signed)
Pt has seen the orthopedic and has an MRI on Friday. But pt sates he is in way to much pain to wait until Friday. Would like a Rx to get him through..  Please call wife at home when ready for pick up. [phone]

## 2016-04-20 NOTE — Telephone Encounter (Signed)
Hey can have oxycodone 5 mg, every 6 hours for breakthrough pain. This is a one time order.

## 2016-04-20 NOTE — Telephone Encounter (Signed)
Please advise 

## 2016-04-20 NOTE — Telephone Encounter (Signed)
Pt is aware rx ready for pick up.  

## 2016-05-10 ENCOUNTER — Other Ambulatory Visit: Payer: Self-pay | Admitting: Adult Health

## 2016-05-10 DIAGNOSIS — M5116 Intervertebral disc disorders with radiculopathy, lumbar region: Secondary | ICD-10-CM

## 2016-05-11 NOTE — Telephone Encounter (Signed)
Ok to refill 90 +1  

## 2016-05-18 ENCOUNTER — Other Ambulatory Visit: Payer: Self-pay | Admitting: Adult Health

## 2016-05-18 DIAGNOSIS — F411 Generalized anxiety disorder: Secondary | ICD-10-CM

## 2016-05-18 NOTE — Telephone Encounter (Signed)
Ok to refill for one month  

## 2016-05-26 ENCOUNTER — Telehealth: Payer: Self-pay

## 2016-05-26 NOTE — Telephone Encounter (Signed)
Rx called in as directed.   

## 2016-05-26 NOTE — Telephone Encounter (Signed)
Patient has requested refill for Lorazepam. Last filled 04/13/16 for #60. Ok to refill?

## 2016-05-26 NOTE — Telephone Encounter (Signed)
Ok to refill 

## 2016-07-01 ENCOUNTER — Other Ambulatory Visit: Payer: Self-pay | Admitting: Adult Health

## 2016-07-01 DIAGNOSIS — F411 Generalized anxiety disorder: Secondary | ICD-10-CM

## 2016-07-01 NOTE — Telephone Encounter (Signed)
Rx called in as directed.   

## 2016-07-01 NOTE — Telephone Encounter (Signed)
Ok to refill for one month  

## 2016-07-01 NOTE — Telephone Encounter (Signed)
Ok to refill 

## 2016-07-20 ENCOUNTER — Encounter (HOSPITAL_COMMUNITY): Payer: Self-pay | Admitting: Emergency Medicine

## 2016-07-20 ENCOUNTER — Emergency Department (HOSPITAL_COMMUNITY)
Admission: EM | Admit: 2016-07-20 | Discharge: 2016-07-20 | Disposition: A | Discharge status: Home / Self Care | Payer: Managed Care, Other (non HMO) | Attending: Emergency Medicine | Admitting: Emergency Medicine

## 2016-07-20 ENCOUNTER — Emergency Department (HOSPITAL_COMMUNITY): Payer: Managed Care, Other (non HMO)

## 2016-07-20 DIAGNOSIS — S0001XA Abrasion of scalp, initial encounter: Secondary | ICD-10-CM

## 2016-07-20 DIAGNOSIS — J45909 Unspecified asthma, uncomplicated: Secondary | ICD-10-CM | POA: Insufficient documentation

## 2016-07-20 DIAGNOSIS — Y9301 Activity, walking, marching and hiking: Secondary | ICD-10-CM | POA: Diagnosis not present

## 2016-07-20 DIAGNOSIS — F17290 Nicotine dependence, other tobacco product, uncomplicated: Secondary | ICD-10-CM | POA: Insufficient documentation

## 2016-07-20 DIAGNOSIS — S0990XA Unspecified injury of head, initial encounter: Secondary | ICD-10-CM | POA: Diagnosis present

## 2016-07-20 DIAGNOSIS — Y999 Unspecified external cause status: Secondary | ICD-10-CM | POA: Diagnosis not present

## 2016-07-20 DIAGNOSIS — R200 Anesthesia of skin: Secondary | ICD-10-CM

## 2016-07-20 DIAGNOSIS — Y929 Unspecified place or not applicable: Secondary | ICD-10-CM | POA: Diagnosis not present

## 2016-07-20 DIAGNOSIS — G8929 Other chronic pain: Secondary | ICD-10-CM

## 2016-07-20 DIAGNOSIS — M5441 Lumbago with sciatica, right side: Secondary | ICD-10-CM | POA: Insufficient documentation

## 2016-07-20 DIAGNOSIS — W19XXXA Unspecified fall, initial encounter: Secondary | ICD-10-CM

## 2016-07-20 DIAGNOSIS — W0110XA Fall on same level from slipping, tripping and stumbling with subsequent striking against unspecified object, initial encounter: Secondary | ICD-10-CM | POA: Diagnosis not present

## 2016-07-20 DIAGNOSIS — M5442 Lumbago with sciatica, left side: Secondary | ICD-10-CM | POA: Diagnosis not present

## 2016-07-20 DIAGNOSIS — Z96651 Presence of right artificial knee joint: Secondary | ICD-10-CM | POA: Insufficient documentation

## 2016-07-20 MED ORDER — KETOROLAC TROMETHAMINE 15 MG/ML IJ SOLN
30.0000 mg | Freq: Once | INTRAMUSCULAR | Status: AC
  Administered 2016-07-20: 30 mg via INTRAMUSCULAR
  Filled 2016-07-20: qty 2

## 2016-07-20 NOTE — Discharge Planning (Signed)
EDCM reviewed discharging chart for possible CM needs.  No needs identified.    

## 2016-07-20 NOTE — ED Notes (Signed)
Patient taken to CT.
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
## 2016-07-20 NOTE — ED Notes (Signed)
EDP at bedside  

## 2016-07-20 NOTE — ED Notes (Signed)
Patient is getting dressed; wheelchair at bedside; visitor helping patient

## 2016-07-20 NOTE — ED Triage Notes (Signed)
Pt from home, states he has had recent issues with balance and falling due to numbness in his feet. Pt states he tripped this morning and hit his head on "a steel beam". Pt states he lost consciousness "briefly". Pt A&O x 4

## 2016-08-02 ENCOUNTER — Other Ambulatory Visit: Payer: Self-pay | Admitting: Adult Health

## 2016-08-02 DIAGNOSIS — F411 Generalized anxiety disorder: Secondary | ICD-10-CM

## 2016-08-03 NOTE — Telephone Encounter (Signed)
Rx called in as directed.   

## 2016-08-03 NOTE — Telephone Encounter (Signed)
Ok to refill for one month  

## 2016-08-03 NOTE — Telephone Encounter (Signed)
Ok to refill 

## 2016-08-31 ENCOUNTER — Other Ambulatory Visit: Payer: Self-pay | Admitting: Adult Health

## 2016-08-31 DIAGNOSIS — F411 Generalized anxiety disorder: Secondary | ICD-10-CM

## 2016-08-31 NOTE — Telephone Encounter (Signed)
Ok to refill 

## 2016-08-31 NOTE — Telephone Encounter (Signed)
Rx called in 

## 2016-09-30 ENCOUNTER — Other Ambulatory Visit: Payer: Self-pay | Admitting: Adult Health

## 2016-09-30 DIAGNOSIS — F411 Generalized anxiety disorder: Secondary | ICD-10-CM

## 2016-09-30 NOTE — Telephone Encounter (Signed)
Ok to refill 

## 2016-09-30 NOTE — Telephone Encounter (Signed)
Ok to refill for one month  

## 2016-09-30 NOTE — Telephone Encounter (Signed)
Rx has been called in as directed.  

## 2016-10-08 ENCOUNTER — Ambulatory Visit (INDEPENDENT_AMBULATORY_CARE_PROVIDER_SITE_OTHER): Payer: Managed Care, Other (non HMO)

## 2016-10-08 DIAGNOSIS — Z23 Encounter for immunization: Secondary | ICD-10-CM

## 2016-10-25 ENCOUNTER — Other Ambulatory Visit: Payer: Self-pay | Admitting: Adult Health

## 2016-10-25 DIAGNOSIS — F411 Generalized anxiety disorder: Secondary | ICD-10-CM

## 2016-10-25 NOTE — Telephone Encounter (Signed)
Ok to refill for one month  

## 2016-10-26 NOTE — Telephone Encounter (Signed)
Rx  Called in as directed.

## 2016-11-29 ENCOUNTER — Other Ambulatory Visit: Payer: Self-pay | Admitting: Adult Health

## 2016-11-29 DIAGNOSIS — F411 Generalized anxiety disorder: Secondary | ICD-10-CM

## 2016-11-30 NOTE — Telephone Encounter (Signed)
Rx has been called in as directed.  

## 2016-11-30 NOTE — Telephone Encounter (Signed)
Ok to refill 

## 2017-01-17 ENCOUNTER — Other Ambulatory Visit: Payer: Self-pay | Admitting: Adult Health

## 2017-01-17 NOTE — Telephone Encounter (Signed)
Last rx given on 3/27 for #60 with no ref

## 2017-01-22 NOTE — Telephone Encounter (Signed)
This has been changed to 1 mg tab. Take one tab BI - 60 pills, 0 refills

## 2017-01-24 NOTE — Telephone Encounter (Signed)
Rx called in as directed for 1mg  tablets.

## 2017-02-21 ENCOUNTER — Other Ambulatory Visit: Payer: Self-pay | Admitting: Adult Health

## 2017-02-21 DIAGNOSIS — F411 Generalized anxiety disorder: Secondary | ICD-10-CM

## 2017-02-21 NOTE — Telephone Encounter (Signed)
Ok to refill 

## 2017-02-22 NOTE — Telephone Encounter (Signed)
Rx has been called in as directed.  

## 2017-02-25 ENCOUNTER — Other Ambulatory Visit: Payer: Self-pay | Admitting: Adult Health

## 2017-02-25 NOTE — Telephone Encounter (Signed)
Ok to refill 

## 2017-03-16 ENCOUNTER — Ambulatory Visit (INDEPENDENT_AMBULATORY_CARE_PROVIDER_SITE_OTHER): Payer: 59 | Admitting: Family Medicine

## 2017-03-16 ENCOUNTER — Encounter: Payer: Self-pay | Admitting: Family Medicine

## 2017-03-16 VITALS — BP 124/86 | Temp 98.7°F | Ht 65.0 in | Wt 229.0 lb

## 2017-03-16 DIAGNOSIS — H109 Unspecified conjunctivitis: Secondary | ICD-10-CM | POA: Diagnosis not present

## 2017-03-16 MED ORDER — TOBRAMYCIN-DEXAMETHASONE 0.3-0.1 % OP SUSP: 2.0000 [drp] | OPHTHALMIC | 0 refills | Status: DC

## 2017-03-16 NOTE — Patient Instructions (Signed)
WE NOW OFFER   [facility] FAST TRACK!!!  SAME DAY Appointments for ACUTE CARE  Such as: Sprains, Injuries, cuts, abrasions, rashes, muscle pain, joint pain, back pain Colds, flu, sore throats, headache, allergies, cough, fever  Ear pain, sinus and eye infections Abdominal pain, nausea, vomiting, diarrhea, upset stomach Animal/insect bites  3 Easy Ways to Schedule: Walk-In Scheduling Call in scheduling Mychart Sign-up: [email or web address]         

## 2017-03-16 NOTE — Progress Notes (Signed)
   Subjective:    Patient ID: Cristian Gonzales, male    DOB: May 31, 1956, 61 y.o.   MRN: [id number]  HPI Here for the sudden onset yesterday of itching and burning in the right eye. He has had a lot of tearing and some yellow DC in the corners. No photophobia. His eye has been red. His vision gets blurry if tears build up in the eye, but this clears up when he wipes away the tears. The left eye is fine. No URI symptoms.    Review of Systems  Constitutional: Negative.   HENT: Negative.   Eyes: Positive for pain, discharge, redness and itching. Negative for photophobia.  Respiratory: Negative.        Objective:   Physical Exam  Constitutional: He appears well-developed and well-nourished.  HENT:  Right Ear: External ear normal.  Left Ear: External ear normal.  Nose: Nose normal.  Mouth/Throat: Oropharynx is clear and moist.  Eyes: EOM are normal. Pupils are equal, round, and reactive to light.  No photophobia. The right conjunctiva is red but the cornea is clear. The left eye is clear.   Neck: Neck supple. No thyromegaly present.  Pulmonary/Chest: Effort normal and breath sounds normal.  Lymphadenopathy:    He has no cervical adenopathy.          Assessment & Plan:  Conjunctivitis, treat with Tobradex drops prn. He knows to follow up if this is not better in 2 days.  Gershon CraneStephen Abelardo Seidner, MD

## 2017-05-06 ENCOUNTER — Other Ambulatory Visit: Payer: Self-pay | Admitting: Adult Health

## 2017-05-06 NOTE — Telephone Encounter (Signed)
DENIED.  FILLED FOR 6 MONTHS ON 02/25/17.  MESSAGE SENT TO THE PHARMACY TO CHECK FILE.

## 2017-05-27 ENCOUNTER — Encounter: Payer: Self-pay | Admitting: Adult Health

## 2017-11-01 ENCOUNTER — Ambulatory Visit (INDEPENDENT_AMBULATORY_CARE_PROVIDER_SITE_OTHER): Payer: 59 | Admitting: Adult Health

## 2017-11-01 ENCOUNTER — Encounter: Payer: Self-pay | Admitting: Adult Health

## 2017-11-01 VITALS — BP 130/92 | Temp 98.0°F | Ht 65.0 in | Wt 233.0 lb

## 2017-11-01 DIAGNOSIS — E782 Mixed hyperlipidemia: Secondary | ICD-10-CM

## 2017-11-01 DIAGNOSIS — Z0000 Encounter for general adult medical examination without abnormal findings: Secondary | ICD-10-CM | POA: Diagnosis not present

## 2017-11-01 DIAGNOSIS — Z125 Encounter for screening for malignant neoplasm of prostate: Secondary | ICD-10-CM

## 2017-11-01 DIAGNOSIS — Z1159 Encounter for screening for other viral diseases: Secondary | ICD-10-CM

## 2017-11-01 DIAGNOSIS — Z114 Encounter for screening for human immunodeficiency virus [HIV]: Secondary | ICD-10-CM | POA: Diagnosis not present

## 2017-11-01 DIAGNOSIS — Z1211 Encounter for screening for malignant neoplasm of colon: Secondary | ICD-10-CM

## 2017-11-01 DIAGNOSIS — F339 Major depressive disorder, recurrent, unspecified: Secondary | ICD-10-CM

## 2017-11-01 DIAGNOSIS — Z23 Encounter for immunization: Secondary | ICD-10-CM

## 2017-11-01 LAB — CBC WITH DIFFERENTIAL/PLATELET
BASOS PCT: 0.9 % (ref 0.0–3.0)
Basophils Absolute: 0.1 10*3/uL (ref 0.0–0.1)
Eosinophils Absolute: 0.3 10*3/uL (ref 0.0–0.7)
Eosinophils Relative: 3 % (ref 0.0–5.0)
HCT: 41.1 % (ref 39.0–52.0)
Hemoglobin: 13.8 g/dL (ref 13.0–17.0)
LYMPHS ABS: 1.7 10*3/uL (ref 0.7–4.0)
Lymphocytes Relative: 19.7 % (ref 12.0–46.0)
MCHC: 33.5 g/dL (ref 30.0–36.0)
MCV: 89.5 fl (ref 78.0–100.0)
MONOS PCT: 5.3 % (ref 3.0–12.0)
Monocytes Absolute: 0.5 10*3/uL (ref 0.1–1.0)
NEUTROS ABS: 6.3 10*3/uL (ref 1.4–7.7)
NEUTROS PCT: 71.1 % (ref 43.0–77.0)
Platelets: 296 10*3/uL (ref 150.0–400.0)
RBC: 4.59 Mil/uL (ref 4.22–5.81)
RDW: 14.1 % (ref 11.5–15.5)
WBC: 8.9 10*3/uL (ref 4.0–10.5)

## 2017-11-01 LAB — BASIC METABOLIC PANEL
BUN: 22 mg/dL (ref 6–23)
CHLORIDE: 103 meq/L (ref 96–112)
CO2: 30 meq/L (ref 19–32)
Calcium: 9.5 mg/dL (ref 8.4–10.5)
Creatinine, Ser: 1.15 mg/dL (ref 0.40–1.50)
GFR: 68.49 mL/min (ref >60.00)
GLUCOSE: 82 mg/dL (ref 70–99)
POTASSIUM: 4.7 meq/L (ref 3.5–5.1)
SODIUM: 140 meq/L (ref 135–145)

## 2017-11-01 LAB — LIPID PANEL
Cholesterol: 244 mg/dL — ABNORMAL HIGH (ref 0–200)
HDL: 40 mg/dL (ref >39.00)
LDL Cholesterol: 178 mg/dL — ABNORMAL HIGH (ref 0–99)
NONHDL: 204
TRIGLYCERIDES: 130 mg/dL (ref 0.0–149.0)
Total CHOL/HDL Ratio: 6
VLDL: 26 mg/dL (ref 0.0–40.0)

## 2017-11-01 LAB — PSA: PSA: 3.09 ng/mL (ref 0.10–4.00)

## 2017-11-01 LAB — HEPATIC FUNCTION PANEL
ALBUMIN: 4.2 g/dL (ref 3.5–5.2)
ALT: 20 U/L (ref 0–53)
AST: 16 U/L (ref 0–37)
Alkaline Phosphatase: 79 U/L (ref 39–117)
Bilirubin, Direct: 0.1 mg/dL (ref 0.0–0.3)
TOTAL PROTEIN: 6.7 g/dL (ref 6.0–8.3)
Total Bilirubin: 0.4 mg/dL (ref 0.2–1.2)

## 2017-11-01 LAB — TSH: TSH: 1 u[IU]/mL (ref 0.35–4.50)

## 2017-11-01 LAB — HEMOGLOBIN A1C: Hgb A1c MFr Bld: 5.8 % (ref 4.6–6.5)

## 2017-11-01 MED ORDER — SERTRALINE HCL 100 MG PO TABS: 100.0000 mg | ORAL_TABLET | Freq: Every day | ORAL | 1 refills | Status: DC
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
## 2017-11-02 LAB — HIV ANTIBODY (ROUTINE TESTING W REFLEX): HIV 1&2 Ab, 4th Generation: NONREACTIVE

## 2017-11-02 LAB — HEPATITIS C ANTIBODY
HEP C AB: NONREACTIVE
SIGNAL TO CUT-OFF: 0.01 (ref <1.00)

## 2017-11-10 ENCOUNTER — Encounter: Payer: Self-pay | Admitting: Family Medicine

## 2017-11-16 ENCOUNTER — Telehealth: Payer: Self-pay | Admitting: Family Medicine

## 2017-11-16 MED ORDER — ATORVASTATIN CALCIUM 20 MG PO TABS: 20.0000 mg | ORAL_TABLET | Freq: Every day | ORAL | 3 refills | Status: DC

## 2017-11-16 NOTE — Telephone Encounter (Signed)
Left a message informing the pt that the prescription has been sent to the pharmacy.

## 2017-11-16 NOTE — Telephone Encounter (Signed)
Copied from CRM [id number]. Topic: General - Other >> Nov 16, 2017 10:19 AM Elliot GaultBell, Tiffany M wrote: Relation to pt: self  Call back number: work # [phone] Pharmacy: [id number] - [place], [place] - [address] [phone] (Phone) [phone] (Fax)   Reason for call:  Patient returning call regarding letter sent out regarding lab results, as per letter   "Your labs are stable. Your cholesterol panel has increased over the year. It is recommend that you add a statin to your daily regimen to help lower your cholesterol and reduce your chance of a heart attack or stroke.  Please call the office if you are agreeable to starting medication"  Patient is in agreement with starting a new medication.     >> Nov 16, 2017 10:23 AM Elliot GaultBell, Tiffany M wrote: Relation to pt: self  Call back number: work # [phone] Pharmacy: [id number] - [place], [place] - [address] [phone] (Phone) [phone] (Fax)   Reason for call:  Patient returning call regarding letter sent out regarding lab results, as per letter   "Your labs are stable. Your cholesterol panel has increased over the year. It is recommend that you add a statin to your daily regimen to help lower your cholesterol and reduce your chance of a heart attack or stroke.  Please call the office if you are agreeable to starting medication"  Patient in agreement to starting a new medication.

## 2018-06-11 ENCOUNTER — Other Ambulatory Visit: Payer: Self-pay | Admitting: Adult Health

## 2018-06-11 DIAGNOSIS — Z0000 Encounter for general adult medical examination without abnormal findings: Secondary | ICD-10-CM

## 2018-06-11 DIAGNOSIS — F339 Major depressive disorder, recurrent, unspecified: Secondary | ICD-10-CM

## 2018-12-19 ENCOUNTER — Ambulatory Visit (INDEPENDENT_AMBULATORY_CARE_PROVIDER_SITE_OTHER): Payer: 59 | Admitting: Adult Health

## 2018-12-19 ENCOUNTER — Encounter: Payer: Self-pay | Admitting: Adult Health

## 2018-12-19 ENCOUNTER — Other Ambulatory Visit: Payer: Self-pay

## 2018-12-19 DIAGNOSIS — J029 Acute pharyngitis, unspecified: Secondary | ICD-10-CM

## 2018-12-19 MED ORDER — AMOXICILLIN-POT CLAVULANATE 875-125 MG PO TABS: 1.0000 | ORAL_TABLET | Freq: Two times a day (BID) | ORAL | 0 refills | Status: DC
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
## 2018-12-23 ENCOUNTER — Other Ambulatory Visit: Payer: Self-pay | Admitting: Adult Health

## 2018-12-25 NOTE — Telephone Encounter (Signed)
Sent to the pharmacy by e-scribe for 90 days. 

## 2019-03-31 ENCOUNTER — Other Ambulatory Visit: Payer: Self-pay | Admitting: Adult Health

## 2019-04-03 ENCOUNTER — Encounter: Payer: Self-pay | Admitting: Family Medicine

## 2019-04-03 NOTE — Telephone Encounter (Signed)
30 day rx sent to the pharmacy by e-scribe.  Letter sent by MyChart.  Pt past due for cpx.

## 2019-04-27 ENCOUNTER — Other Ambulatory Visit: Payer: Self-pay | Admitting: Adult Health

## 2019-05-01 NOTE — Telephone Encounter (Signed)
Denied.  Pt is past due for cpx and fasting lab work. 

## 2019-05-26 ENCOUNTER — Other Ambulatory Visit: Payer: Self-pay | Admitting: Adult Health

## 2019-05-29 NOTE — Telephone Encounter (Signed)
Denied.  Past due for cpx and fasting lab work. 

## 2019-06-08 ENCOUNTER — Ambulatory Visit: Payer: 59 | Admitting: Adult Health

## 2019-06-08 ENCOUNTER — Other Ambulatory Visit: Payer: Self-pay

## 2019-06-08 ENCOUNTER — Encounter: Payer: Self-pay | Admitting: Adult Health

## 2019-06-08 VITALS — BP 130/76 | Temp 97.3°F | Wt 238.0 lb

## 2019-06-08 DIAGNOSIS — H6502 Acute serous otitis media, left ear: Secondary | ICD-10-CM | POA: Diagnosis not present

## 2019-06-08 DIAGNOSIS — Z23 Encounter for immunization: Secondary | ICD-10-CM

## 2019-06-08 MED ORDER — AMOXICILLIN-POT CLAVULANATE 875-125 MG PO TABS: 1.0000 | ORAL_TABLET | Freq: Two times a day (BID) | ORAL | 0 refills | Status: DC

## 2019-06-08 NOTE — Addendum Note (Signed)
Addended by: Miles Costain T on: 06/08/2019 07:56 AM   Modules accepted: Orders
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
## 2019-06-08 NOTE — Patient Instructions (Signed)
It was great seeing you today   I have sent in Augmentin for the ear infection   Please schedule your annual physical

## 2019-08-23 ENCOUNTER — Other Ambulatory Visit: Payer: Self-pay | Admitting: Adult Health

## 2019-08-23 DIAGNOSIS — F339 Major depressive disorder, recurrent, unspecified: Secondary | ICD-10-CM

## 2019-08-23 DIAGNOSIS — Z0000 Encounter for general adult medical examination without abnormal findings: Secondary | ICD-10-CM

## 2019-08-24 NOTE — Telephone Encounter (Signed)
Left a message for a return call.  Need to confirm if pt is taking this medication.

## 2019-08-28 ENCOUNTER — Ambulatory Visit (INDEPENDENT_AMBULATORY_CARE_PROVIDER_SITE_OTHER): Payer: Managed Care, Other (non HMO) | Admitting: Adult Health

## 2019-08-28 ENCOUNTER — Ambulatory Visit: Payer: Self-pay | Admitting: *Deleted

## 2019-08-28 ENCOUNTER — Other Ambulatory Visit: Payer: Self-pay

## 2019-08-28 ENCOUNTER — Encounter: Payer: Self-pay | Admitting: Adult Health

## 2019-08-28 VITALS — BP 124/84 | Temp 98.2°F | Ht 66.0 in | Wt 241.0 lb

## 2019-08-28 DIAGNOSIS — E782 Mixed hyperlipidemia: Secondary | ICD-10-CM

## 2019-08-28 DIAGNOSIS — Z0000 Encounter for general adult medical examination without abnormal findings: Secondary | ICD-10-CM | POA: Diagnosis not present

## 2019-08-28 DIAGNOSIS — F339 Major depressive disorder, recurrent, unspecified: Secondary | ICD-10-CM

## 2019-08-28 DIAGNOSIS — Z125 Encounter for screening for malignant neoplasm of prostate: Secondary | ICD-10-CM | POA: Diagnosis not present

## 2019-08-28 DIAGNOSIS — Z1211 Encounter for screening for malignant neoplasm of colon: Secondary | ICD-10-CM | POA: Diagnosis not present

## 2019-08-28 LAB — COMPREHENSIVE METABOLIC PANEL
ALT: 17 U/L (ref 0–53)
AST: 15 U/L (ref 0–37)
Albumin: 4.4 g/dL (ref 3.5–5.2)
Alkaline Phosphatase: 88 U/L (ref 39–117)
BUN: 17 mg/dL (ref 6–23)
CO2: 30 mEq/L (ref 19–32)
Calcium: 9.5 mg/dL (ref 8.4–10.5)
Chloride: 103 mEq/L (ref 96–112)
Creatinine, Ser: 1.01 mg/dL (ref 0.40–1.50)
GFR: 74.41 mL/min (ref >60.00)
Glucose, Bld: 101 mg/dL — ABNORMAL HIGH (ref 70–99)
Potassium: 4.7 mEq/L (ref 3.5–5.1)
Sodium: 139 mEq/L (ref 135–145)
Total Bilirubin: 0.4 mg/dL (ref 0.2–1.2)
Total Protein: 6.7 g/dL (ref 6.0–8.3)

## 2019-08-28 LAB — CBC WITH DIFFERENTIAL/PLATELET
Basophils Absolute: 0.1 10*3/uL (ref 0.0–0.1)
Basophils Relative: 1.2 % (ref 0.0–3.0)
Eosinophils Absolute: 0.3 10*3/uL (ref 0.0–0.7)
Eosinophils Relative: 3.3 % (ref 0.0–5.0)
HCT: 43.8 % (ref 39.0–52.0)
Hemoglobin: 14.4 g/dL (ref 13.0–17.0)
Lymphocytes Relative: 22.4 % (ref 12.0–46.0)
Lymphs Abs: 1.8 10*3/uL (ref 0.7–4.0)
MCHC: 32.9 g/dL (ref 30.0–36.0)
MCV: 89.4 fl (ref 78.0–100.0)
Monocytes Absolute: 0.5 10*3/uL (ref 0.1–1.0)
Monocytes Relative: 6.9 % (ref 3.0–12.0)
Neutro Abs: 5.3 10*3/uL (ref 1.4–7.7)
Neutrophils Relative %: 66.2 % (ref 43.0–77.0)
Platelets: 326 10*3/uL (ref 150.0–400.0)
RBC: 4.9 Mil/uL (ref 4.22–5.81)
RDW: 14.6 % (ref 11.5–15.5)
WBC: 7.9 10*3/uL (ref 4.0–10.5)

## 2019-08-28 LAB — LIPID PANEL
Cholesterol: 230 mg/dL — ABNORMAL HIGH (ref 0–200)
HDL: 43.2 mg/dL (ref >39.00)
LDL Cholesterol: 166 mg/dL — ABNORMAL HIGH (ref 0–99)
NonHDL: 186.77
Total CHOL/HDL Ratio: 5
Triglycerides: 105 mg/dL (ref 0.0–149.0)
VLDL: 21 mg/dL (ref 0.0–40.0)

## 2019-08-28 LAB — TSH: TSH: 1.45 u[IU]/mL (ref 0.35–4.50)

## 2019-08-28 LAB — PSA: PSA: 3.87 ng/mL (ref 0.10–4.00)

## 2019-08-28 MED ORDER — TRAZODONE HCL 100 MG PO TABS: 100.0000 mg | ORAL_TABLET | Freq: Every evening | ORAL | 1 refills | Status: DC | PRN

## 2019-08-28 MED ORDER — PRAVASTATIN SODIUM 20 MG PO TABS: 20.0000 mg | ORAL_TABLET | Freq: Every day | ORAL | 3 refills | Status: DC

## 2019-08-28 NOTE — Telephone Encounter (Signed)
Attempted to call patient 4 times in regards to his lab results. No answer.  Routing back to LB at [facility] for review.

## 2019-08-28 NOTE — Telephone Encounter (Signed)
Summary: lab results   Patient is requesting a call back regarding his lab results he got done today 12/22. Office states NT can disclose  Call back [phone]      Per PCP: Labs are stable except for cholesterol panel. I sent in a new statin for him to try   Attempted to return call- left message to call back on VM
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
## 2019-09-05 ENCOUNTER — Other Ambulatory Visit: Payer: Self-pay | Admitting: Family Medicine

## 2019-09-05 DIAGNOSIS — Z1211 Encounter for screening for malignant neoplasm of colon: Secondary | ICD-10-CM

## 2019-09-05 DIAGNOSIS — Z1212 Encounter for screening for malignant neoplasm of rectum: Secondary | ICD-10-CM

## 2019-09-19 ENCOUNTER — Ambulatory Visit: Payer: Self-pay

## 2019-09-19 NOTE — Telephone Encounter (Signed)
Pt. Reports since he started taking Trazodone he has had blurred vision, watery eyes, Muscle cramps. No other symptoms.Concerned he needs to change medications again. Spoke with Carolin Guernsey in the practice and will send triage over for review.  Answer Assessment - Initial Assessment Questions 1. DESCRIPTION: "What is the vision loss like? Describe it for me." (e.g., complete vision loss, blurred vision, double vision, floaters, etc.)     Blurred vision 2. LOCATION: "One or both eyes?" If one, ask: "Which eye?"     Both eyes 3. SEVERITY: "Can you see anything?" If so, ask: "What can you see?" (e.g., fine print)     Can see to read 4. ONSET: "When did this begin?" "Did it start suddenly or has this been gradual?"     2 weeks ago 5. PATTERN: "Does this come and go, or has it been constant since it started?"     Constant 6. PAIN: "Is there any pain in your eye(s)?"  (Scale 1-10; or mild, moderate, severe)     No 7. CONTACTS-GLASSES: "Do you wear contacts or glasses?"     Glasses 8. CAUSE: "What do you think is causing this visual problem?"     New medication 9. OTHER SYMPTOMS: "Do you have any other symptoms?" (e.g., confusion, headache, arm or leg weakness, speech problems)    Leg muscle cramps 10. PREGNANCY: "Is there any chance you are pregnant?" "When was your last menstrual period?"       n/a  Protocols used: VISION LOSS OR CHANGE-A-AH

## 2019-09-19 NOTE — Telephone Encounter (Signed)
Please advise or would you like to schedule a virtual visit?

## 2019-09-19 NOTE — Telephone Encounter (Signed)
Pt walked into the clinic and was advised of Cory's message by front office staff. Pt will be scheduled.

## 2019-09-19 NOTE — Telephone Encounter (Signed)
He can stop Trazodone and follow up virtually

## 2019-09-20 ENCOUNTER — Other Ambulatory Visit: Payer: Self-pay

## 2019-09-20 ENCOUNTER — Telehealth (INDEPENDENT_AMBULATORY_CARE_PROVIDER_SITE_OTHER): Payer: Managed Care, Other (non HMO) | Admitting: Adult Health

## 2019-09-20 DIAGNOSIS — F39 Unspecified mood [affective] disorder: Secondary | ICD-10-CM

## 2019-09-20 MED ORDER — FLUOXETINE HCL 20 MG PO CAPS: 20.0000 mg | ORAL_CAPSULE | Freq: Every day | ORAL | 0 refills | Status: DC
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
## 2019-10-03 LAB — COLOGUARD

## 2019-10-05 LAB — COLOGUARD: Cologuard: NEGATIVE

## 2019-10-08 LAB — COLOGUARD: COLOGUARD: NEGATIVE

## 2019-10-08 LAB — EXTERNAL GENERIC LAB PROCEDURE: COLOGUARD: NEGATIVE

## 2019-10-09 ENCOUNTER — Telehealth: Payer: Self-pay | Admitting: Adult Health

## 2019-10-09 NOTE — Telephone Encounter (Signed)
Left message to return phone call.

## 2019-10-09 NOTE — Telephone Encounter (Signed)
pt prescribe  Prozac, and he is not sleeping requesting an RX for Ambien  [id number] - [place], [place] - [address] AT [facility] [address]  Phone:  [phone] Fax:  [phone] contact number  [phone]

## 2019-10-10 NOTE — Telephone Encounter (Signed)
Pt is returning Kendra's phone call  Pt can be reached at [phone]

## 2019-10-10 NOTE — Telephone Encounter (Signed)
Left message to return phone call.

## 2019-10-10 NOTE — Telephone Encounter (Signed)
Called pt no answer. Call alternative number and spouse answered and stated pt is not around. Will call back later.

## 2019-10-10 NOTE — Telephone Encounter (Signed)
Please advise if pt needs to wait another week for medication to work or if he needs another visit etc.

## 2019-10-10 NOTE — Telephone Encounter (Signed)
I am sorry, I do not prescribe ambien. I would be more than happy to discuss other options via virtual visit

## 2019-10-11 ENCOUNTER — Encounter: Payer: Self-pay | Admitting: Adult Health

## 2019-10-11 NOTE — Telephone Encounter (Signed)
Called pt 2x no answer.  

## 2019-12-06 ENCOUNTER — Ambulatory Visit: Payer: 59 | Attending: Internal Medicine

## 2019-12-06 DIAGNOSIS — Z23 Encounter for immunization: Secondary | ICD-10-CM

## 2019-12-06 NOTE — Progress Notes (Signed)
   Covid-19 Vaccination Clinic  Name:  Cristian Gonzales    MRN: [id number] DOB: May 11, 1956  12/06/2019  Mr. Cristian Gonzales was observed post Covid-19 immunization for 15 minutes without incident. He was provided with Vaccine Information Sheet and instruction to access the V-Safe system.   Mr. Cristian Gonzales was instructed to call 911 with any severe reactions post vaccine: Marland Kitchen Difficulty breathing  . Swelling of face and throat  . A fast heartbeat  . A bad rash all over body  . Dizziness and weakness   Immunizations Administered    Name Date Dose VIS Date Route   Pfizer COVID-19 Vaccine 12/06/2019  8:24 AM 0.3 mL 08/17/2019 Intramuscular   Manufacturer: [employer], [employer]   Lot: [id number]   NDC: [address]-4

## 2019-12-13 ENCOUNTER — Other Ambulatory Visit: Payer: Self-pay

## 2019-12-14 ENCOUNTER — Other Ambulatory Visit: Payer: Self-pay | Admitting: Adult Health

## 2019-12-14 ENCOUNTER — Encounter: Payer: Self-pay | Admitting: Adult Health

## 2019-12-14 ENCOUNTER — Ambulatory Visit (INDEPENDENT_AMBULATORY_CARE_PROVIDER_SITE_OTHER): Payer: 59

## 2019-12-14 ENCOUNTER — Ambulatory Visit (INDEPENDENT_AMBULATORY_CARE_PROVIDER_SITE_OTHER): Payer: 59 | Admitting: Adult Health

## 2019-12-14 VITALS — BP 124/72 | Temp 98.3°F | Wt 245.0 lb

## 2019-12-14 DIAGNOSIS — M25561 Pain in right knee: Secondary | ICD-10-CM

## 2019-12-14 DIAGNOSIS — G8929 Other chronic pain: Secondary | ICD-10-CM

## 2019-12-14 DIAGNOSIS — F39 Unspecified mood [affective] disorder: Secondary | ICD-10-CM

## 2019-12-14 MED ORDER — NAPROXEN 500 MG PO TABS: 500.0000 mg | ORAL_TABLET | Freq: Two times a day (BID) | ORAL | 0 refills | Status: DC

## 2019-12-14 MED ORDER — OXYCODONE-ACETAMINOPHEN 10-325 MG PO TABS: 1.0000 | ORAL_TABLET | Freq: Three times a day (TID) | ORAL | 0 refills | Status: AC | PRN

## 2019-12-14 NOTE — Telephone Encounter (Signed)
PT HAS AN APPT TODAY WITH CORY.  WILL HOLD
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
## 2019-12-14 NOTE — Patient Instructions (Signed)
It was great seeing you today   I will follow up with you regarding your xray   I have sent in Naprosyn for pain - you can take the percocet for breakthrough pain   Someone from Dr. Tana Felts office will call you

## 2019-12-14 NOTE — Telephone Encounter (Signed)
Sent to the pharmacy by e-scribe. 

## 2019-12-20 ENCOUNTER — Telehealth: Payer: Self-pay | Admitting: Adult Health

## 2019-12-20 NOTE — Telephone Encounter (Signed)
Pt states that he was suppose to go see Dr. Lequita Halt for his knee. He has been playing phone tag for 2/3 days now and feels from past experience with Knee specialist, if they can not return a phone call properly or relay messages properly he does not want to see them nor want them operating on him. He would like to see a different doctor.  Pt can be reached at [phone]  He also said [place]!

## 2019-12-21 NOTE — Telephone Encounter (Signed)
Spoke to Amy at Dr. Tana Felts office and she scheduled the pt for his appointment on 01/04/20 @ 9:00 AM.  Arrival time at 8:30.  Called and left all information on the patient's voicemail.  Nothing further needed.

## 2019-12-31 ENCOUNTER — Ambulatory Visit: Payer: 59 | Attending: Internal Medicine

## 2019-12-31 DIAGNOSIS — Z23 Encounter for immunization: Secondary | ICD-10-CM

## 2019-12-31 NOTE — Progress Notes (Signed)
   Covid-19 Vaccination Clinic  Name:  Cristian Gonzales    MRN: [id number] DOB: 11/06/1955  12/31/2019  Mr. Cristian Gonzales was observed post Covid-19 immunization for 15 minutes without incident. He was provided with Vaccine Information Sheet and instruction to access the V-Safe system.   Mr. Cristian Gonzales was instructed to call 911 with any severe reactions post vaccine: Marland Kitchen Difficulty breathing  . Swelling of face and throat  . A fast heartbeat  . A bad rash all over body  . Dizziness and weakness   Immunizations Administered    Name Date Dose VIS Date Route   Pfizer COVID-19 Vaccine 12/31/2019  8:34 AM 0.3 mL 10/31/2018 Intramuscular   Manufacturer: [employer], [employer]   Lot: [id number]   NDC: [address]-1

## 2020-01-14 ENCOUNTER — Other Ambulatory Visit (HOSPITAL_COMMUNITY): Payer: Self-pay | Admitting: Orthopedic Surgery

## 2020-01-14 ENCOUNTER — Other Ambulatory Visit: Payer: Self-pay | Admitting: Orthopedic Surgery

## 2020-01-14 DIAGNOSIS — M25561 Pain in right knee: Secondary | ICD-10-CM

## 2020-01-23 ENCOUNTER — Encounter (HOSPITAL_COMMUNITY)
Admission: RE | Admit: 2020-01-23 | Discharge: 2020-01-23 | Disposition: A | Discharge status: Home / Self Care | Payer: 59 | Source: Ambulatory Visit | Attending: Orthopedic Surgery | Admitting: Orthopedic Surgery

## 2020-01-23 ENCOUNTER — Other Ambulatory Visit: Payer: Self-pay

## 2020-01-23 DIAGNOSIS — M25561 Pain in right knee: Secondary | ICD-10-CM | POA: Insufficient documentation

## 2020-01-23 MED ORDER — TECHNETIUM TC 99M MEDRONATE IV KIT
20.0000 | PACK | Freq: Once | INTRAVENOUS | Status: AC | PRN
  Administered 2020-01-23: 20 via INTRAVENOUS

## 2020-02-18 ENCOUNTER — Other Ambulatory Visit: Payer: Self-pay | Admitting: Adult Health

## 2020-02-18 DIAGNOSIS — F339 Major depressive disorder, recurrent, unspecified: Secondary | ICD-10-CM

## 2020-02-20 NOTE — Telephone Encounter (Signed)
ASSESSMENT AND PLAN:  Discussed the following assessment and plan:  1. Mood disorder (HCC) -Symptoms consistent with intolerance to trazodone.  We will switch back to SSRI and trial him on Prozac.  He was advised to follow-up if no improvement. - FLUoxetine (PROZAC) 20 MG capsule; Take 1 capsule (20 mg total) by mouth daily.  Dispense: 90 capsule; Refill:

## 2020-03-10 ENCOUNTER — Other Ambulatory Visit: Payer: Self-pay | Admitting: Adult Health

## 2020-03-10 DIAGNOSIS — F339 Major depressive disorder, recurrent, unspecified: Secondary | ICD-10-CM

## 2020-03-11 NOTE — Telephone Encounter (Signed)
SENT TO THE PHARMACY BY E-SCRIBE. 

## 2020-06-09 ENCOUNTER — Other Ambulatory Visit: Payer: Self-pay

## 2020-06-09 ENCOUNTER — Emergency Department (HOSPITAL_COMMUNITY)
Admission: EM | Admit: 2020-06-09 | Discharge: 2020-06-09 | Disposition: A | Discharge status: Home / Self Care | Payer: 59 | Attending: Emergency Medicine | Admitting: Emergency Medicine

## 2020-06-09 DIAGNOSIS — R200 Anesthesia of skin: Secondary | ICD-10-CM | POA: Insufficient documentation

## 2020-06-09 DIAGNOSIS — Z5321 Procedure and treatment not carried out due to patient leaving prior to being seen by health care provider: Secondary | ICD-10-CM | POA: Insufficient documentation

## 2020-06-09 LAB — COMPREHENSIVE METABOLIC PANEL
ALT: 25 U/L (ref 0–44)
AST: 22 U/L (ref 15–41)
Albumin: 3.9 g/dL (ref 3.5–5.0)
Alkaline Phosphatase: 82 U/L (ref 38–126)
Anion gap: 9 (ref 5–15)
BUN: 23 mg/dL (ref 8–23)
CO2: 27 mmol/L (ref 22–32)
Calcium: 8.6 mg/dL — ABNORMAL LOW (ref 8.9–10.3)
Chloride: 101 mmol/L (ref 98–111)
Creatinine, Ser: 0.99 mg/dL (ref 0.61–1.24)
GFR calc Af Amer: >60 mL/min (ref >60)
GFR calc non Af Amer: >60 mL/min (ref >60)
Glucose, Bld: 82 mg/dL (ref 70–99)
Potassium: 3.9 mmol/L (ref 3.5–5.1)
Sodium: 137 mmol/L (ref 135–145)
Total Bilirubin: 0.4 mg/dL (ref 0.3–1.2)
Total Protein: 6.7 g/dL (ref 6.5–8.1)

## 2020-06-09 LAB — CBC WITH DIFFERENTIAL/PLATELET
Abs Immature Granulocytes: 0.06 10*3/uL (ref 0.00–0.07)
Basophils Absolute: 0.1 10*3/uL (ref 0.0–0.1)
Basophils Relative: 1 %
Eosinophils Absolute: 0.2 10*3/uL (ref 0.0–0.5)
Eosinophils Relative: 2 %
HCT: 42.4 % (ref 39.0–52.0)
Hemoglobin: 13.6 g/dL (ref 13.0–17.0)
Immature Granulocytes: 1 %
Lymphocytes Relative: 25 %
Lymphs Abs: 1.9 10*3/uL (ref 0.7–4.0)
MCH: 29.9 pg (ref 26.0–34.0)
MCHC: 32.1 g/dL (ref 30.0–36.0)
MCV: 93.2 fL (ref 80.0–100.0)
Monocytes Absolute: 0.6 10*3/uL (ref 0.1–1.0)
Monocytes Relative: 7 %
Neutro Abs: 4.9 10*3/uL (ref 1.7–7.7)
Neutrophils Relative %: 64 %
Platelets: 308 10*3/uL (ref 150–400)
RBC: 4.55 MIL/uL (ref 4.22–5.81)
RDW: 13.6 % (ref 11.5–15.5)
WBC: 7.7 10*3/uL (ref 4.0–10.5)
nRBC: 0 % (ref 0.0–0.2)

## 2020-06-09 NOTE — ED Notes (Signed)
ED helper stated that pt stated he was leaving

## 2020-06-09 NOTE — ED Triage Notes (Signed)
Pt presents with Bilateral hip numbness radiating to his BLE x5 days, taking tylenol and ibuprofen. Pt reports legs feeling heavy.

## 2021-02-08 ENCOUNTER — Other Ambulatory Visit: Payer: Self-pay | Admitting: Adult Health

## 2021-02-08 DIAGNOSIS — F39 Unspecified mood [affective] disorder: Secondary | ICD-10-CM

## 2021-03-17 ENCOUNTER — Encounter (HOSPITAL_COMMUNITY): Payer: Self-pay | Admitting: Neurology

## 2021-03-17 ENCOUNTER — Other Ambulatory Visit: Payer: Self-pay

## 2021-03-17 ENCOUNTER — Emergency Department (HOSPITAL_COMMUNITY): Payer: 59

## 2021-03-17 ENCOUNTER — Inpatient Hospital Stay (HOSPITAL_COMMUNITY): Payer: 59

## 2021-03-17 ENCOUNTER — Inpatient Hospital Stay (HOSPITAL_COMMUNITY)
Admission: EM | Admit: 2021-03-17 | Discharge: 2021-03-19 | DRG: 041 | Disposition: A | Discharge status: Home / Self Care | Payer: 59 | Attending: Neurology | Admitting: Neurology

## 2021-03-17 DIAGNOSIS — Z6841 Body Mass Index (BMI) 40.0 and over, adult: Secondary | ICD-10-CM

## 2021-03-17 DIAGNOSIS — F419 Anxiety disorder, unspecified: Secondary | ICD-10-CM | POA: Diagnosis present

## 2021-03-17 DIAGNOSIS — I63412 Cerebral infarction due to embolism of left middle cerebral artery: Secondary | ICD-10-CM | POA: Diagnosis not present

## 2021-03-17 DIAGNOSIS — Z20822 Contact with and (suspected) exposure to covid-19: Secondary | ICD-10-CM | POA: Diagnosis present

## 2021-03-17 DIAGNOSIS — E785 Hyperlipidemia, unspecified: Secondary | ICD-10-CM | POA: Diagnosis present

## 2021-03-17 DIAGNOSIS — E7800 Pure hypercholesterolemia, unspecified: Secondary | ICD-10-CM | POA: Diagnosis not present

## 2021-03-17 DIAGNOSIS — Q211 Atrial septal defect: Secondary | ICD-10-CM | POA: Diagnosis not present

## 2021-03-17 DIAGNOSIS — Z885 Allergy status to narcotic agent status: Secondary | ICD-10-CM | POA: Diagnosis not present

## 2021-03-17 DIAGNOSIS — Z96651 Presence of right artificial knee joint: Secondary | ICD-10-CM | POA: Diagnosis present

## 2021-03-17 DIAGNOSIS — Z800 Family history of malignant neoplasm of digestive organs: Secondary | ICD-10-CM

## 2021-03-17 DIAGNOSIS — R29702 NIHSS score 2: Secondary | ICD-10-CM | POA: Diagnosis present

## 2021-03-17 DIAGNOSIS — Z888 Allergy status to other drugs, medicaments and biological substances status: Secondary | ICD-10-CM

## 2021-03-17 DIAGNOSIS — I6340 Cerebral infarction due to embolism of unspecified cerebral artery: Secondary | ICD-10-CM | POA: Diagnosis present

## 2021-03-17 DIAGNOSIS — F32A Depression, unspecified: Secondary | ICD-10-CM | POA: Diagnosis present

## 2021-03-17 DIAGNOSIS — M1990 Unspecified osteoarthritis, unspecified site: Secondary | ICD-10-CM | POA: Diagnosis present

## 2021-03-17 DIAGNOSIS — R29898 Other symptoms and signs involving the musculoskeletal system: Secondary | ICD-10-CM | POA: Diagnosis not present

## 2021-03-17 DIAGNOSIS — F17200 Nicotine dependence, unspecified, uncomplicated: Secondary | ICD-10-CM | POA: Diagnosis not present

## 2021-03-17 DIAGNOSIS — I639 Cerebral infarction, unspecified: Secondary | ICD-10-CM | POA: Diagnosis present

## 2021-03-17 DIAGNOSIS — M4802 Spinal stenosis, cervical region: Secondary | ICD-10-CM | POA: Diagnosis present

## 2021-03-17 DIAGNOSIS — I6389 Other cerebral infarction: Secondary | ICD-10-CM | POA: Diagnosis not present

## 2021-03-17 DIAGNOSIS — E119 Type 2 diabetes mellitus without complications: Secondary | ICD-10-CM | POA: Diagnosis present

## 2021-03-17 DIAGNOSIS — Z8249 Family history of ischemic heart disease and other diseases of the circulatory system: Secondary | ICD-10-CM | POA: Diagnosis not present

## 2021-03-17 DIAGNOSIS — F17290 Nicotine dependence, other tobacco product, uncomplicated: Secondary | ICD-10-CM | POA: Diagnosis present

## 2021-03-17 DIAGNOSIS — R270 Ataxia, unspecified: Secondary | ICD-10-CM | POA: Diagnosis present

## 2021-03-17 DIAGNOSIS — R531 Weakness: Secondary | ICD-10-CM | POA: Diagnosis present

## 2021-03-17 DIAGNOSIS — I6330 Cerebral infarction due to thrombosis of unspecified cerebral artery: Secondary | ICD-10-CM | POA: Diagnosis not present

## 2021-03-17 DIAGNOSIS — Z79899 Other long term (current) drug therapy: Secondary | ICD-10-CM

## 2021-03-17 DIAGNOSIS — Z8673 Personal history of transient ischemic attack (TIA), and cerebral infarction without residual deficits: Secondary | ICD-10-CM | POA: Diagnosis not present

## 2021-03-17 DIAGNOSIS — I340 Nonrheumatic mitral (valve) insufficiency: Secondary | ICD-10-CM | POA: Diagnosis not present

## 2021-03-17 LAB — URINALYSIS, ROUTINE W REFLEX MICROSCOPIC
Bilirubin Urine: NEGATIVE
Glucose, UA: NEGATIVE mg/dL
Hgb urine dipstick: NEGATIVE
Ketones, ur: NEGATIVE mg/dL
Leukocytes,Ua: NEGATIVE
Nitrite: NEGATIVE
Protein, ur: NEGATIVE mg/dL
Specific Gravity, Urine: 1.045 — ABNORMAL HIGH (ref 1.005–1.030)
pH: 6 (ref 5.0–8.0)

## 2021-03-17 LAB — CBC
HCT: 44.7 % (ref 39.0–52.0)
Hemoglobin: 14.2 g/dL (ref 13.0–17.0)
MCH: 29.6 pg (ref 26.0–34.0)
MCHC: 31.8 g/dL (ref 30.0–36.0)
MCV: 93.1 fL (ref 80.0–100.0)
Platelets: 282 10*3/uL (ref 150–400)
RBC: 4.8 MIL/uL (ref 4.22–5.81)
RDW: 13.8 % (ref 11.5–15.5)
WBC: 9.4 10*3/uL (ref 4.0–10.5)
nRBC: 0 % (ref 0.0–0.2)

## 2021-03-17 LAB — COMPREHENSIVE METABOLIC PANEL
ALT: 24 U/L (ref 0–44)
AST: 19 U/L (ref 15–41)
Albumin: 3.8 g/dL (ref 3.5–5.0)
Alkaline Phosphatase: 80 U/L (ref 38–126)
Anion gap: 8 (ref 5–15)
BUN: 17 mg/dL (ref 8–23)
CO2: 27 mmol/L (ref 22–32)
Calcium: 9.1 mg/dL (ref 8.9–10.3)
Chloride: 104 mmol/L (ref 98–111)
Creatinine, Ser: 0.95 mg/dL (ref 0.61–1.24)
GFR, Estimated: >60 mL/min (ref >60)
Glucose, Bld: 110 mg/dL — ABNORMAL HIGH (ref 70–99)
Potassium: 3.8 mmol/L (ref 3.5–5.1)
Sodium: 139 mmol/L (ref 135–145)
Total Bilirubin: 0.5 mg/dL (ref 0.3–1.2)
Total Protein: 6.6 g/dL (ref 6.5–8.1)

## 2021-03-17 LAB — RAPID URINE DRUG SCREEN, HOSP PERFORMED
Amphetamines: NOT DETECTED
Barbiturates: NOT DETECTED
Benzodiazepines: NOT DETECTED
Cocaine: NOT DETECTED
Opiates: NOT DETECTED
Tetrahydrocannabinol: NOT DETECTED

## 2021-03-17 LAB — RESP PANEL BY RT-PCR (FLU A&B, COVID) ARPGX2
Influenza A by PCR: NEGATIVE
Influenza B by PCR: NEGATIVE
SARS Coronavirus 2 by RT PCR: NEGATIVE

## 2021-03-17 LAB — DIFFERENTIAL
Abs Immature Granulocytes: 0.06 10*3/uL (ref 0.00–0.07)
Basophils Absolute: 0.1 10*3/uL (ref 0.0–0.1)
Basophils Relative: 1 %
Eosinophils Absolute: 0.4 10*3/uL (ref 0.0–0.5)
Eosinophils Relative: 4 %
Immature Granulocytes: 1 %
Lymphocytes Relative: 26 %
Lymphs Abs: 2.5 10*3/uL (ref 0.7–4.0)
Monocytes Absolute: 0.8 10*3/uL (ref 0.1–1.0)
Monocytes Relative: 8 %
Neutro Abs: 5.7 10*3/uL (ref 1.7–7.7)
Neutrophils Relative %: 60 %

## 2021-03-17 LAB — ECHOCARDIOGRAM COMPLETE
Area-P 1/2: 2.6 cm2
Calc EF: 70.8 %
Height: 65 in
S' Lateral: 3 cm
Single Plane A2C EF: 68.9 %
Single Plane A4C EF: 76.7 %
Weight: 3890.68 oz

## 2021-03-17 LAB — I-STAT CHEM 8, ED
BUN: 17 mg/dL (ref 8–23)
Calcium, Ion: 1.1 mmol/L — ABNORMAL LOW (ref 1.15–1.40)
Chloride: 105 mmol/L (ref 98–111)
Creatinine, Ser: 0.9 mg/dL (ref 0.61–1.24)
Glucose, Bld: 101 mg/dL — ABNORMAL HIGH (ref 70–99)
HCT: 43 % (ref 39.0–52.0)
Hemoglobin: 14.6 g/dL (ref 13.0–17.0)
Potassium: 3.8 mmol/L (ref 3.5–5.1)
Sodium: 140 mmol/L (ref 135–145)
TCO2: 26 mmol/L (ref 22–32)

## 2021-03-17 LAB — LIPID PANEL
Cholesterol: 232 mg/dL — ABNORMAL HIGH (ref 0–200)
HDL: 46 mg/dL (ref >40)
LDL Cholesterol: 170 mg/dL — ABNORMAL HIGH (ref 0–99)
Total CHOL/HDL Ratio: 5 RATIO
Triglycerides: 78 mg/dL (ref <150)
VLDL: 16 mg/dL (ref 0–40)

## 2021-03-17 LAB — HEMOGLOBIN A1C
Hgb A1c MFr Bld: 5.7 % — ABNORMAL HIGH (ref 4.8–5.6)
Mean Plasma Glucose: 116.89 mg/dL

## 2021-03-17 LAB — ETHANOL: Alcohol, Ethyl (B): <10 mg/dL (ref <10)

## 2021-03-17 LAB — CBG MONITORING, ED: Glucose-Capillary: 98 mg/dL (ref 70–99)

## 2021-03-17 LAB — MRSA NEXT GEN BY PCR, NASAL: MRSA by PCR Next Gen: NOT DETECTED

## 2021-03-17 LAB — APTT: aPTT: 28 seconds (ref 24–36)

## 2021-03-17 LAB — PROTIME-INR
INR: 0.9 (ref 0.8–1.2)
Prothrombin Time: 12.5 seconds (ref 11.4–15.2)

## 2021-03-17 LAB — HIV ANTIBODY (ROUTINE TESTING W REFLEX): HIV Screen 4th Generation wRfx: NONREACTIVE

## 2021-03-17 LAB — CK: Total CK: 91 U/L (ref 49–397)

## 2021-03-17 MED ORDER — FLUOXETINE HCL 20 MG PO CAPS
20.0000 mg | ORAL_CAPSULE | Freq: Every day | ORAL | Status: DC
  Administered 2021-03-17 – 2021-03-19 (×3): 20 mg via ORAL
  Filled 2021-03-17 (×3): qty 1

## 2021-03-17 MED ORDER — STROKE: EARLY STAGES OF RECOVERY BOOK
Freq: Once | Status: AC
  Filled 2021-03-17: qty 1

## 2021-03-17 MED ORDER — ATORVASTATIN CALCIUM 40 MG PO TABS: 40.0000 mg | ORAL_TABLET | Freq: Every day | ORAL | Status: DC

## 2021-03-17 MED ORDER — IOHEXOL 350 MG/ML SOLN
80.0000 mL | Freq: Once | INTRAVENOUS | Status: AC | PRN
  Administered 2021-03-17: 80 mL via INTRAVENOUS

## 2021-03-17 MED ORDER — SODIUM CHLORIDE 0.9 % IV SOLN: INTRAVENOUS | Status: DC

## 2021-03-17 MED ORDER — ACETAMINOPHEN 650 MG RE SUPP: 650.0000 mg | RECTAL | Status: DC | PRN

## 2021-03-17 MED ORDER — SENNOSIDES-DOCUSATE SODIUM 8.6-50 MG PO TABS: 1.0000 | ORAL_TABLET | Freq: Every evening | ORAL | Status: DC | PRN

## 2021-03-17 MED ORDER — ALTEPLASE (STROKE) FULL DOSE INFUSION
90.0000 mg | Freq: Once | INTRAVENOUS | Status: AC
  Administered 2021-03-17: 90 mg via INTRAVENOUS
  Filled 2021-03-17: qty 100

## 2021-03-17 MED ORDER — SODIUM CHLORIDE 0.9 % IV SOLN
50.0000 mL | Freq: Once | INTRAVENOUS | Status: AC
  Administered 2021-03-17: 50 mL via INTRAVENOUS

## 2021-03-17 MED ORDER — NAPROXEN 250 MG PO TABS
500.0000 mg | ORAL_TABLET | Freq: Two times a day (BID) | ORAL | Status: DC | PRN
  Filled 2021-03-17: qty 2

## 2021-03-17 MED ORDER — CHLORHEXIDINE GLUCONATE CLOTH 2 % EX PADS
6.0000 | MEDICATED_PAD | Freq: Every day | CUTANEOUS | Status: DC
  Administered 2021-03-17 – 2021-03-19 (×3): 6 via TOPICAL

## 2021-03-17 MED ORDER — ACETAMINOPHEN 160 MG/5ML PO SOLN: 650.0000 mg | ORAL | Status: DC | PRN

## 2021-03-17 MED ORDER — PANTOPRAZOLE SODIUM 40 MG IV SOLR
40.0000 mg | Freq: Every day | INTRAVENOUS | Status: DC
  Administered 2021-03-17: 40 mg via INTRAVENOUS
  Filled 2021-03-17: qty 40

## 2021-03-17 MED ORDER — CLEVIDIPINE BUTYRATE 0.5 MG/ML IV EMUL
0.0000 mg/h | INTRAVENOUS | Status: DC
Start: 2021-03-17 — End: 2021-03-20

## 2021-03-17 MED ORDER — ATORVASTATIN CALCIUM 80 MG PO TABS
80.0000 mg | ORAL_TABLET | Freq: Every day | ORAL | Status: DC
  Administered 2021-03-18 – 2021-03-19 (×2): 80 mg via ORAL
  Filled 2021-03-17: qty 2
  Filled 2021-03-17: qty 1

## 2021-03-17 MED ORDER — BUTALBITAL-APAP-CAFFEINE 50-325-40 MG PO TABS: 1.0000 | ORAL_TABLET | Freq: Two times a day (BID) | ORAL | Status: DC | PRN

## 2021-03-17 MED ORDER — ACETAMINOPHEN 325 MG PO TABS
650.0000 mg | ORAL_TABLET | ORAL | Status: DC | PRN
  Administered 2021-03-17: 650 mg via ORAL
  Filled 2021-03-17: qty 2

## 2021-03-17 NOTE — ED Triage Notes (Signed)
Pt c/o occipital HA, R arm weakness since 2100, no medicine PTA, no hx of same. Endorses dizziness, denies CP/ SHOB, no recent fevers. Slight drift noted in R arm when eyes closed  170/100
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
## 2021-03-17 NOTE — Consult Note (Deleted)
Referring Physician: Dr. Stevie Kern    Chief Complaint: Acute onset of RUE weakness  HPI: Cristian Gonzales is an 65 y.o. male with a PMHx of depression and anxiety, arthritis and remote C-spine fracture without neurological sequelae who presents to the ED with acute onset of RUE weakness. He states he went to bed at 8 PM feeling normal. He was tossing and turning with difficulty going to sleep. At 9 PM he remembers feeling normal and at some point he drifted off to sleep. On awakening at 11 PM, he noted that his RUE was not moving correctly and that it felt heavy. He denies any sensory numbness. He had no face weakness, face numbness or leg weakness. He did feel somewhat unsteady when ambulating. He also endorses a headache, which gets worse when he sits up from laying down, to a 5/10. He has no significant history of headaches.   LSN: [id number] tPA Given: Yes  Past Medical History:  Diagnosis Date   Anxiety    Arthritis    Cervical spine fracture (HCC)    13 -diving accident, no neurolgic sequelae   Depression    Syncope 11/27/2012   From Tramadol    Past Surgical History:  Procedure Laterality Date   CERVICAL DISCECTOMY  2004   dr Channing Mutters   KNEE SURGERY  1981   right   NECK SURGERY     REPLACEMENT TOTAL KNEE     right   REPLACEMENT TOTAL KNEE      Family History  Problem Relation Age of Onset   Heart attack Father 58       Sudden Cardiac Death   Liver cancer Mother 39   Social History:  reports that he has been smoking cigars. He has never used smokeless tobacco. He reports that he does not drink alcohol and does not use drugs.  Allergies:  Allergies  Allergen Reactions   Tramadol     Pass Out   Trazodone And Nefazodone Other (See Comments)    Flushness, blurred vision, muscle cramps     Home Medications:  No current facility-administered medications on file prior to encounter.   Current Outpatient Medications on File Prior to Encounter  Medication Sig Dispense Refill    FLUoxetine (PROZAC) 20 MG capsule TAKE 1 CAPSULE BY MOUTH DAILY 90 capsule 1   naproxen (NAPROSYN) 500 MG tablet Take 1 tablet (500 mg total) by mouth 2 (two) times daily with a meal. 30 tablet 0   pravastatin (PRAVACHOL) 20 MG tablet Take 1 tablet (20 mg total) by mouth daily. 90 tablet 3   traZODone (DESYREL) 100 MG tablet TAKE 1 TABLET (100 MG TOTAL) BY MOUTH AT BEDTIME AS NEEDED FOR SLEEP. 90 tablet 1    ROS: Denies CP, SOB or any other pain except headache. Other ROS as per HPI.    Physical Examination: Blood pressure (!) 150/92, pulse 69, temperature 98.2 F (36.8 C), temperature source Oral, resp. rate 16, height 5\' 6"  (1.676 m), weight 103.9 kg, SpO2 96 %.  HEENT: [place]/AT Lungs: Respirations unlabored Ext: No edema  Neurologic Examination: Mental Status: Awake, alert and fully oriented. Speech fluent without evidence of aphasia.  Able to follow all commands without difficulty. Cranial Nerves: II:  Visual fields intact. PERRL.  III,IV, VI: No ptosis. EOMI. No nystagmus. Mild saccadic quality to visual pursuits.  V,VII: Smile symmetric, facial light touch sensation normal bilaterally VIII: Hearing intact to voice IX,X: No hypophonia XI: Decreased shoulder shrug  on the right XII: Midline tongue  extension  Motor: RUE 4/5 deltoid, biceps and triceps. 5/5 grip. 4/5 finger extension and abduction RLE 5/5 LUE and LLE 5/5 Pronator drift on the right.  Sensory: Temp and light touch intact throughout, bilaterally Deep Tendon Reflexes:  2+ bilateral brachioradialis and left patella. 0 right patella (prior surgery) 1+ bilateral achilles Plantars: Right: downgoing   Left: downgoing Cerebellar: Positive for ataxia with FNF on the right. Normal left FNF and H-S. Unable to perform H-S on the right due to prior knee operation.  Gait: Deferred  Results for orders placed or performed during the hospital encounter of 03/17/21 (from the past 48 hour(s))  CBG monitoring, ED     Status: None    Collection Time: 03/17/21 12:20 AM  Result Value Ref Range   Glucose-Capillary 98 70 - 99 mg/dL    Comment: Glucose reference range applies only to samples taken after fasting for at least 8 hours.  Protime-INR     Status: None   Collection Time: 03/17/21 12:33 AM  Result Value Ref Range   Prothrombin Time 12.5 11.4 - 15.2 seconds   INR 0.9 0.8 - 1.2    Comment: (NOTE) INR goal varies based on device and disease states. Performed at [facility], [address], [place], [place] [address]   APTT     Status: None   Collection Time: 03/17/21 12:33 AM  Result Value Ref Range   aPTT 28 24 - 36 seconds    Comment: Performed at [facility], [address], [place], [place] [address]  CBC     Status: None   Collection Time: 03/17/21 12:33 AM  Result Value Ref Range   WBC 9.4 4.0 - 10.5 K/uL   RBC 4.80 4.22 - 5.81 MIL/uL   Hemoglobin 14.2 13.0 - 17.0 g/dL   HCT 68.1 27.5 - 17.0 %   MCV 93.1 80.0 - 100.0 fL   MCH 29.6 26.0 - 34.0 pg   MCHC 31.8 30.0 - 36.0 g/dL   RDW 01.7 49.4 - 49.6 %   Platelets 282 150 - 400 K/uL   nRBC 0.0 0.0 - 0.2 %    Comment: Performed at [facility], [address], [place], [place] [address]  Differential     Status: None   Collection Time: 03/17/21 12:33 AM  Result Value Ref Range   Neutrophils Relative % 60 %   Neutro Abs 5.7 1.7 - 7.7 K/uL   Lymphocytes Relative 26 %   Lymphs Abs 2.5 0.7 - 4.0 K/uL   Monocytes Relative 8 %   Monocytes Absolute 0.8 0.1 - 1.0 K/uL   Eosinophils Relative 4 %   Eosinophils Absolute 0.4 0.0 - 0.5 K/uL   Basophils Relative 1 %   Basophils Absolute 0.1 0.0 - 0.1 K/uL   Immature Granulocytes 1 %   Abs Immature Granulocytes 0.06 0.00 - 0.07 K/uL    Comment: Performed at [facility], [address] [place], [place] [address]  I-stat chem 8, ED     Status: Abnormal   Collection Time: 03/17/21 12:35 AM  Result Value Ref Range   Sodium 140 135 - 145 mmol/L   Potassium 3.8 3.5 - 5.1 mmol/L    Chloride 105 98 - 111 mmol/L   BUN 17 8 - 23 mg/dL   Creatinine, Ser 5.99 0.61 - 1.24 mg/dL   Glucose, Bld 357 (H) 70 - 99 mg/dL    Comment: Glucose reference range applies only to samples taken  after fasting for at least 8 hours.   Calcium, Ion 1.10 (L) 1.15 - 1.40 mmol/L   TCO2 26 22 - 32 mmol/L   Hemoglobin 14.6 13.0 - 17.0 g/dL   HCT 38.2 50.5 - 39.7 %   CT HEAD CODE STROKE WO CONTRAST  Result Date: 03/17/2021 CLINICAL DATA:  Code stroke.  Right-sided weakness EXAM: CT HEAD WITHOUT CONTRAST TECHNIQUE: Contiguous axial images were obtained from the base of the skull through the vertex without intravenous contrast. COMPARISON:  07/20/2016 FINDINGS: Brain: There is no mass, hemorrhage or extra-axial collection. The size and configuration of the ventricles and extra-axial CSF spaces are normal. The brain parenchyma is normal, without evidence of acute or chronic infarction. Vascular: No abnormal hyperdensity of the major intracranial arteries or dural venous sinuses. No intracranial atherosclerosis. Skull: The visualized skull base, calvarium and extracranial soft tissues are normal. Sinuses/Orbits: No fluid levels or advanced mucosal thickening of the visualized paranasal sinuses. No mastoid or middle ear effusion. The orbits are normal. ASPECTS [facility] Stroke Program Early CT Score) - Ganglionic level infarction (caudate, lentiform nuclei, internal capsule, insula, M1-M3 cortex): 7 - Supraganglionic infarction (M4-M6 cortex): 3 Total score (0-10 with 10 being normal): 10 IMPRESSION: 1. Normal head CT. 2. ASPECTS is 10. These results were communicated to Dr. Caryl Pina at 12:42 am on 03/17/2021 by text page via the [facility] messaging system. Electronically Signed   By: Deatra Robinson M.D.   On: 03/17/2021 00:42    Assessment: 65 y.o. male with acute onset of RUE weakness 1. Exam reveals right upper extremity weakness and ataxia. Findings are best localizable to the right cerebellar hemisphere. Most  likely etiology is acute ischemic stroke.  2. Stroke Risk Factors - Obesity 3. CT head negative.  4. CTA of head and neck: No LVO. Bilateral ICA narrowing noted proximally.  5. After comprehensive review of possible contraindications, he has no absolute contraindications to tPA administration. 6. Patient is a tPA candidate. Discussed extensively the risks/benefits of tPA treatment vs. no treatment with the patient, including risks of hemorrhage and death with tPA administration versus worse overall outcomes on average in patients within tPA time window who are not administered tPA. Overall benefits of tPA regarding long-term prognosis are felt to outweigh risks. The patient expressed understanding and wish to proceed with tPA.   Plan: 1. Admitting to Neuro ICU.  2. Post-tPA order set to include frequent neuro checks and BP management.  3. No antiplatelet medications or anticoagulants for at least 24 hours following tPA.  4. DVT prophylaxis with SCDs.  5. Will need to be started on a statin.  6. Will need to be started on antiplatelet therapy if follow up CT at 24 hours is negative for hemorrhagic conversion. 7. Cardiac telemetry  8. TTE.  9. MRI brain.  10. PT/OT/Speech.  11. NPO until passes swallow evaluation.  12. Fasting lipid panel, HgbA1c   @Electronically  signed: Dr.  03/17/2021, 1:09 AM

## 2021-03-17 NOTE — ED Provider Notes (Signed)
Emergency Medicine Provider Triage Evaluation Note  Cristian Gonzales , a 65 y.o. male  was evaluated in triage.  Pt complains of headache and R arm weakness. LSN 2100; onset of headache "sometime after that". Headache is occipital pressure, nonradiating. No medications PTA. No hx of similar symptoms. EMS reports some drift of RUE when eyes closed, but not if eyes open. Patient reports headache worse now that he is sitting upright. Endorses associated dizziness. No nausea, vomiting, CP, SOB, fevers. No chronic anticoagulation.  Review of Systems  Positive: Headache, RUE weakness Negative: N/V, CP, SOB, fever  Physical Exam  There were no vitals taken for this visit. Gen:   Awake, no distress   Resp:  Normal effort  MSK:   Freely moving BLE Other:  Speech is clear, goal oriented. No facial drooping. Drift of the RUE when eyes closed. Grip strength 5/5 b/l. Sensation intact b/l.   Medical Decision Making  Medically screening exam initiated at 12:03 AM.  Appropriate orders placed.  Damareon Lanni Dalgleish was informed that the remainder of the evaluation will be completed by another provider, this initial triage assessment does not replace that evaluation, and the importance of remaining in the ED until their evaluation is complete.  Headache, RUE weakness   Antony Madura, PA-C 03/17/21 0011    Shon Baton, MD 03/17/21 [phone]

## 2021-03-17 NOTE — Progress Notes (Signed)
Pharmacist Code Stroke Response  Notified to mix tPA at 0054 by Dr. Otelia Limes Delivered tPA to RN at 0057  tPA dose = 9mg  bolus over 1 minute followed by 81mg  for a total dose of 90mg  over 1 hour  Issues/delays encountered (if applicable): n/a  , PharmD, BCPS  03/17/21 1:02 AM

## 2021-03-17 NOTE — Progress Notes (Signed)
  Echocardiogram 2D Echocardiogram has been performed.  Janalyn Harder 03/17/2021, 10:35 AM

## 2021-03-17 NOTE — ED Provider Notes (Signed)
[facility] EMERGENCY DEPARTMENT Provider Note   CSN: [id number] Arrival date & time: 03/17/21  0002     History Chief Complaint  Patient presents with   Code Stroke    Cristian Gonzales is a 65 y.o. male.  Patient presents to the emergency department with a chief complaint of right arm weakness and headache.  He states that the symptoms started around 10:30 PM.  He states that he was last normal at 9:00 tonight and then went to bed.  He states that when he woke up he felt odd, had difficulty moving his right arm, and had trouble dialing 911.  He denies any vision changes.  Denies any slurred speech.  Denies any treatments prior to arrival.  The history is provided by the patient. No language interpreter was used.      Past Medical History:  Diagnosis Date   Anxiety    Arthritis    Cervical spine fracture (HCC)    13 -diving accident, no neurolgic sequelae   Depression    Syncope 11/27/2012   From Tramadol    Patient Active Problem List   Diagnosis Date Noted   Hyperlipidemia 07/30/2015   Lumbar disc disease with radiculopathy 07/29/2014   Left-sided thoracic back pain 06/05/2014   Numbness and tingling sensation of skin 06/25/2013   Syncope 11/27/2012   Depression 11/27/2012   Epididymitis, left 11/23/2010   ALLERGIC RHINITIS 11/07/2009   EXTRINSIC ASTHMA, UNSPECIFIED 02/05/2008   Anxiety state 08/08/2007    Past Surgical History:  Procedure Laterality Date   CERVICAL DISCECTOMY  2004   dr Channing Mutters   KNEE SURGERY  1981   right   NECK SURGERY     REPLACEMENT TOTAL KNEE     right   REPLACEMENT TOTAL KNEE         Family History  Problem Relation Age of Onset   Heart attack Father 61       Sudden Cardiac Death   Liver cancer Mother 63    Social History   Tobacco Use   Smoking status: Some Days    Pack years: 0.00    Types: Cigars    Last attempt to quit: 01/04/2010    Years since quitting: 11.2   Smokeless tobacco: Never   Tobacco  comments:    Smokes 1 cigar per day  Substance Use Topics   Alcohol use: No   Drug use: No    Home Medications Prior to Admission medications   Medication Sig Start Date End Date Taking? Authorizing Provider  FLUoxetine (PROZAC) 20 MG capsule TAKE 1 CAPSULE BY MOUTH DAILY 02/09/21   Nafziger, Kandee Keen, NP  naproxen (NAPROSYN) 500 MG tablet Take 1 tablet (500 mg total) by mouth 2 (two) times daily with a meal. 12/14/19   Nafziger, Kandee Keen, NP  pravastatin (PRAVACHOL) 20 MG tablet Take 1 tablet (20 mg total) by mouth daily. 08/28/19   Nafziger, Kandee Keen, NP  traZODone (DESYREL) 100 MG tablet TAKE 1 TABLET (100 MG TOTAL) BY MOUTH AT BEDTIME AS NEEDED FOR SLEEP. 03/11/20   Nafziger, Kandee Keen, NP    Allergies    Tramadol and Trazodone and nefazodone  Review of Systems   Review of Systems  Constitutional:  Negative for fatigue.  Respiratory:  Negative for shortness of breath.   Gastrointestinal:  Negative for nausea and vomiting.  Neurological:  Positive for weakness and headaches.  All other systems reviewed and are negative.  Physical Exam Updated Vital Signs BP (!) 150/92 (BP Location: Left  Arm)   Pulse 69   Temp 98.2 F (36.8 C) (Oral)   Resp 16   Ht 5\' 6"  (1.676 m)   Wt 103.9 kg   SpO2 96%   BMI 36.96 kg/m   Physical Exam Vitals and nursing note reviewed.  Constitutional:      Appearance: He is well-developed.  HENT:     Head: Normocephalic and atraumatic.  Eyes:     Conjunctiva/sclera: Conjunctivae normal.  Cardiovascular:     Rate and Rhythm: Normal rate and regular rhythm.     Heart sounds: No murmur heard. Pulmonary:     Effort: Pulmonary effort is normal. No respiratory distress.     Breath sounds: Normal breath sounds.  Abdominal:     Palpations: Abdomen is soft.     Tenderness: There is no abdominal tenderness.  Musculoskeletal:     Cervical back: Neck supple.  Skin:    General: Skin is warm and dry.  Neurological:     Mental Status: He is alert.     Comments: Slight  RUE drift in triage Reassessment after initiation of tPA and after CTAs show still slight right upper extremity drift Otherwise, cranial nerves are intact, movements are goal oriented, speech is clear   Psychiatric:        Mood and Affect: Mood normal.        Behavior: Behavior normal.    ED Results / Procedures / Treatments   Labs (all labs ordered are listed, but only abnormal results are displayed) Labs Reviewed  I-STAT CHEM 8, ED - Abnormal; Notable for the following components:      Result Value   Glucose, Bld 101 (*)    Calcium, Ion 1.10 (*)    All other components within normal limits  RESP PANEL BY RT-PCR (FLU A&B, COVID) ARPGX2  PROTIME-INR  APTT  CBC  DIFFERENTIAL  ETHANOL  COMPREHENSIVE METABOLIC PANEL  RAPID URINE DRUG SCREEN, HOSP PERFORMED  URINALYSIS, ROUTINE W REFLEX MICROSCOPIC  CBG MONITORING, ED    EKG None  Radiology CT HEAD CODE STROKE WO CONTRAST  Result Date: 03/17/2021 CLINICAL DATA:  Code stroke.  Right-sided weakness EXAM: CT HEAD WITHOUT CONTRAST TECHNIQUE: Contiguous axial images were obtained from the base of the skull through the vertex without intravenous contrast. COMPARISON:  07/20/2016 FINDINGS: Brain: There is no mass, hemorrhage or extra-axial collection. The size and configuration of the ventricles and extra-axial CSF spaces are normal. The brain parenchyma is normal, without evidence of acute or chronic infarction. Vascular: No abnormal hyperdensity of the major intracranial arteries or dural venous sinuses. No intracranial atherosclerosis. Skull: The visualized skull base, calvarium and extracranial soft tissues are normal. Sinuses/Orbits: No fluid levels or advanced mucosal thickening of the visualized paranasal sinuses. No mastoid or middle ear effusion. The orbits are normal. ASPECTS [facility] Stroke Program Early CT Score) - Ganglionic level infarction (caudate, lentiform nuclei, internal capsule, insula, M1-M3 cortex): 7 - Supraganglionic  infarction (M4-M6 cortex): 3 Total score (0-10 with 10 being normal): 10 IMPRESSION: 1. Normal head CT. 2. ASPECTS is 10. These results were communicated to Dr. [facility] at 12:42 am on 03/17/2021 by text page via the [facility] messaging system. Electronically Signed   By: [facility] M.D.   On: 03/17/2021 00:42    Procedures .Critical Care  Date/Time: 03/17/2021 1:17 AM Performed by: 05/18/2021, PA-C Authorized by: Roxy Horseman, PA-C   Critical care provider statement:    Critical care time (minutes):  35   Critical care was  necessary to treat or prevent imminent or life-threatening deterioration of the following conditions:  CNS failure or compromise   Critical care was time spent personally by me on the following activities:  Discussions with consultants, evaluation of patient's response to treatment, examination of patient, ordering and performing treatments and interventions, ordering and review of laboratory studies, ordering and review of radiographic studies, pulse oximetry, re-evaluation of patient's condition, obtaining history from patient or surrogate and review of old charts   Medications Ordered in ED Medications  alteplase (ACTIVASE) 1 mg/mL infusion SOLN 90 mg (has no administration in time range)    Followed by  0.9 %  sodium chloride infusion (has no administration in time range)  iohexol (OMNIPAQUE) 350 MG/ML injection 80 mL (80 mLs Intravenous Contrast Given 03/17/21 0101)    ED Course  I have reviewed the triage vital signs and the nursing notes.  Pertinent labs & imaging results that were available during my care of the patient were reviewed by me and considered in my medical decision making (see chart for details).    MDM Rules/Calculators/A&P                          Patient here for headache and right arm weakness.  Code stroke activated in triage.    Dr. Otelia Limes, neurology has seen the patient.  Patient LSN at 2100.  He is a candidate for tPA.  tPA  ordered by Dr. Otelia Limes.  CTa head and neck also ordered.  On my reassessment, patient has yet to have any change in his symptoms.  Appreciate Dr. Shelbie Hutching help in the care of this patient.  Dr. Otelia Limes to admit. Final Clinical Impression(s) / ED Diagnoses Final diagnoses:  Ataxia    Rx / DC Orders ED Discharge Orders     None        Roxy Horseman, PA-C 03/17/21 0222    Shon Baton, MD 03/17/21 [phone]

## 2021-03-17 NOTE — ED Notes (Signed)
Pt reporting increased feeling in his right hand (30 minutes post TPA start), continued headache

## 2021-03-17 NOTE — Progress Notes (Signed)
PT Cancellation Note  Patient Details Name: AMANDEEP NESMITH MRN: [id number] DOB: 07-07-1956   Cancelled Treatment:    Reason Eval/Treat Not Completed: Active bedrest order  Lillia Pauls, PT, DPT Acute Rehabilitation Services Pager [phone] Office [phone]    Norval Morton 03/17/2021, 8:05 AM

## 2021-03-17 NOTE — ED Notes (Signed)
Right arm drift now mild, pt reports continued increase in feeling and dexterity. Pt headache remains, but has decreased.

## 2021-03-17 NOTE — Code Documentation (Signed)
Stroke Response Nurse Documentation Code Documentation  Cristian Gonzales is a 65 y.o. male arriving to [facility] ED via Private Vehicle on 7/11 with past medical hx of HLD, anxiety. Code stroke was activated by ED. Patient from home where he was LKW at 2100 and now complaining of right arm weakness. On No antithrombotic. Stroke team at the bedside on patient arrival. Labs drawn and patient cleared for CT by Antony Madura PA. Patient to CT with team. NIHSS 2, see documentation for details and code stroke times. Patient with right arm weakness and right limb ataxia on exam. The following imaging was completed:  CTA. Patient is a candidate for tPA due to fixed neurological deficit. Bedside handoff with ED RN [place].    Rose Fillers  Rapid Response RN

## 2021-03-18 ENCOUNTER — Inpatient Hospital Stay (HOSPITAL_COMMUNITY): Payer: 59

## 2021-03-18 DIAGNOSIS — F17200 Nicotine dependence, unspecified, uncomplicated: Secondary | ICD-10-CM

## 2021-03-18 DIAGNOSIS — I63412 Cerebral infarction due to embolism of left middle cerebral artery: Secondary | ICD-10-CM

## 2021-03-18 MED ORDER — SODIUM CHLORIDE 0.9 % IV SOLN: INTRAVENOUS | Status: DC

## 2021-03-18 MED ORDER — ASPIRIN EC 81 MG PO TBEC
81.0000 mg | DELAYED_RELEASE_TABLET | Freq: Every day | ORAL | Status: DC
  Administered 2021-03-18 – 2021-03-19 (×2): 81 mg via ORAL
  Filled 2021-03-18 (×2): qty 1

## 2021-03-18 MED ORDER — CLOPIDOGREL BISULFATE 75 MG PO TABS
75.0000 mg | ORAL_TABLET | Freq: Every day | ORAL | Status: DC
  Administered 2021-03-18 – 2021-03-19 (×2): 75 mg via ORAL
  Filled 2021-03-18 (×2): qty 1

## 2021-03-18 NOTE — Progress Notes (Signed)
    [facility] has been requested to perform a transesophageal echocardiogram on 7/14 for Stroke.  After careful review of history and examination, the risks and benefits of transesophageal echocardiogram have been explained including risks of esophageal damage, perforation (1:10,000 risk), bleeding, pharyngeal hematoma as well as other potential complications associated with conscious sedation including aspiration, arrhythmia, respiratory failure and death. Alternatives to treatment were discussed, questions were answered. Patient is willing to proceed. Labs and vital signs stable.  Caylen Yardley, NP-C 03/18/2021 5:08 PM   
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
## 2021-03-18 NOTE — H&P (View-Only) (Signed)
    [facility] has been requested to perform a transesophageal echocardiogram on 7/14 for Stroke.  After careful review of history and examination, the risks and benefits of transesophageal echocardiogram have been explained including risks of esophageal damage, perforation (1:10,000 risk), bleeding, pharyngeal hematoma as well as other potential complications associated with conscious sedation including aspiration, arrhythmia, respiratory failure and death. Alternatives to treatment were discussed, questions were answered. Patient is willing to proceed. Labs and vital signs stable.  Laverda Page, NP-C 03/18/2021 5:08 PM

## 2021-03-18 NOTE — Plan of Care (Signed)
  Problem: Education: Goal: Knowledge of General Education information will improve Description: Including pain rating scale, medication(s)/side effects and non-pharmacologic comfort measures Outcome: Progressing   Problem: Health Behavior/Discharge Planning: Goal: Ability to manage health-related needs will improve Outcome: Progressing   Problem: Clinical Measurements: Goal: Ability to maintain clinical measurements within normal limits will improve Outcome: Progressing Goal: Will remain free from infection Outcome: Progressing Goal: Diagnostic test results will improve Outcome: Progressing Goal: Respiratory complications will improve Outcome: Progressing Goal: Cardiovascular complication will be avoided Outcome: Progressing   Problem: Coping: Goal: Level of anxiety will decrease Outcome: Progressing   Problem: Safety: Goal: Ability to remain free from injury will improve Outcome: Progressing   Problem: Skin Integrity: Goal: Risk for impaired skin integrity will decrease Outcome: Progressing   Problem: Education: Goal: Knowledge of disease or condition will improve Outcome: Progressing Goal: Knowledge of secondary prevention will improve Outcome: Progressing Goal: Knowledge of patient specific risk factors addressed and post discharge goals established will improve Outcome: Progressing Goal: Individualized Educational Video(s) Outcome: Progressing   Problem: Ischemic Stroke/TIA Tissue Perfusion: Goal: Complications of ischemic stroke/TIA will be minimized Outcome: Progressing

## 2021-03-18 NOTE — Progress Notes (Signed)
  Speech Language Pathology Treatment:    Patient Details Name: Cristian Gonzales MRN: [id number] DOB: 1956-08-17 Today's Date: 03/18/2021 Time:  -     Spoke with pt and wife briefly (screened). No speech-language-cognitive needs at this time.   Royce Macadamia 03/18/2021, 12:12 PM

## 2021-03-19 ENCOUNTER — Encounter (HOSPITAL_COMMUNITY)
Admission: EM | Disposition: A | Discharge status: Home / Self Care | Payer: Self-pay | Source: Home / Self Care | Attending: Neurology

## 2021-03-19 ENCOUNTER — Inpatient Hospital Stay (HOSPITAL_COMMUNITY): Payer: 59 | Admitting: Anesthesiology

## 2021-03-19 ENCOUNTER — Encounter (HOSPITAL_COMMUNITY): Payer: Self-pay | Admitting: Neurology

## 2021-03-19 ENCOUNTER — Inpatient Hospital Stay (HOSPITAL_COMMUNITY): Payer: 59

## 2021-03-19 DIAGNOSIS — I6330 Cerebral infarction due to thrombosis of unspecified cerebral artery: Secondary | ICD-10-CM

## 2021-03-19 DIAGNOSIS — I6389 Other cerebral infarction: Secondary | ICD-10-CM

## 2021-03-19 DIAGNOSIS — I340 Nonrheumatic mitral (valve) insufficiency: Secondary | ICD-10-CM

## 2021-03-19 DIAGNOSIS — I639 Cerebral infarction, unspecified: Secondary | ICD-10-CM

## 2021-03-19 HISTORY — PX: BUBBLE STUDY: SHX6837

## 2021-03-19 HISTORY — PX: TEE WITHOUT CARDIOVERSION: SHX5443

## 2021-03-19 HISTORY — PX: LOOP RECORDER INSERTION: EP1214

## 2021-03-19 SURGERY — ECHOCARDIOGRAM, TRANSESOPHAGEAL
Anesthesia: Monitor Anesthesia Care

## 2021-03-19 SURGERY — LOOP RECORDER INSERTION

## 2021-03-19 MED ORDER — ACETAMINOPHEN 325 MG PO TABS: 650.0000 mg | ORAL_TABLET | ORAL | Status: DC | PRN

## 2021-03-19 MED ORDER — PROPOFOL 500 MG/50ML IV EMUL
INTRAVENOUS | Status: DC | PRN
  Administered 2021-03-19: 125 ug/kg/min via INTRAVENOUS

## 2021-03-19 MED ORDER — ATORVASTATIN CALCIUM 80 MG PO TABS: 80.0000 mg | ORAL_TABLET | Freq: Every day | ORAL | 0 refills | Status: DC

## 2021-03-19 MED ORDER — LIDOCAINE-EPINEPHRINE 1 %-1:100000 IJ SOLN
INTRAMUSCULAR | Status: DC | PRN
  Administered 2021-03-19: 10 mL

## 2021-03-19 MED ORDER — CLOPIDOGREL BISULFATE 75 MG PO TABS: 75.0000 mg | ORAL_TABLET | Freq: Every day | ORAL | 0 refills | Status: DC

## 2021-03-19 MED ORDER — PROPOFOL 10 MG/ML IV BOLUS
INTRAVENOUS | Status: DC | PRN
  Administered 2021-03-19: 20 mg via INTRAVENOUS

## 2021-03-19 MED ORDER — ASPIRIN 81 MG PO TBEC: 81.0000 mg | DELAYED_RELEASE_TABLET | Freq: Every day | ORAL | 11 refills | Status: DC

## 2021-03-19 SURGICAL SUPPLY — 2 items
MONITOR MOBILE MNGR LINQ22 (Prosthesis & Implant Heart) ×1 IMPLANT
PACK LOOP INSERTION (CUSTOM PROCEDURE TRAY) ×2 IMPLANT

## 2021-03-19 NOTE — Anesthesia Procedure Notes (Signed)
Procedure Name: MAC Date/Time: 03/19/2021 12:48 PM Performed by: Inda Coke, CRNA Pre-anesthesia Checklist: Patient identified, Emergency Drugs available, Suction available, Timeout performed and Patient being monitored Patient Re-evaluated:Patient Re-evaluated prior to induction Oxygen Delivery Method: Nasal cannula Induction Type: IV induction Dental Injury: Teeth and Oropharynx as per pre-operative assessment
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
## 2021-03-19 NOTE — CV Procedure (Signed)
TEE:  EF 55% Mild MR No LAA thrombus Small PFO with mildly positive bubble study No effusion  Normal RV Normal AV Mild TR  Patient had lots of secretions and coughing during exam  Sats fine  Would proceed with loop recorder  Charlton Haws MD [facility]

## 2021-03-19 NOTE — Anesthesia Postprocedure Evaluation (Signed)
Anesthesia Post Note  Patient: Stanely Sexson Nipper  Procedure(s) Performed: TRANSESOPHAGEAL ECHOCARDIOGRAM (TEE) BUBBLE STUDY     Patient location during evaluation: PACU Anesthesia Type: MAC Level of consciousness: awake and alert Pain management: pain level controlled Vital Signs Assessment: post-procedure vital signs reviewed and stable Respiratory status: spontaneous breathing, nonlabored ventilation and respiratory function stable Cardiovascular status: stable and blood pressure returned to baseline Anesthetic complications: no   No notable events documented.  Last Vitals:  Vitals:   03/19/21 1345 03/19/21 1355  BP: 139/67 133/72  Pulse: 60 (!) 59  Resp: 14 14  Temp:    SpO2: 95% 95%    Last Pain:  Vitals:   03/19/21 1314  TempSrc: Oral  PainSc: 0-No pain                 Audry Pili

## 2021-03-19 NOTE — Interval H&P Note (Signed)
History and Physical Interval Note:  03/19/2021 12:32 PM  Cristian Gonzales  has presented today for surgery, with the diagnosis of STROKE.  The various methods of treatment have been discussed with the patient and family. After consideration of risks, benefits and other options for treatment, the patient has consented to  Procedure(s): TRANSESOPHAGEAL ECHOCARDIOGRAM (TEE) (N/A) as a surgical intervention.  The patient's history has been reviewed, patient examined, no change in status, stable for surgery.  I have reviewed the patient's chart and labs.  Questions were answered to the patient's satisfaction.     Charlton Haws

## 2021-03-19 NOTE — Interval H&P Note (Signed)
History and Physical Interval Note:  03/19/2021 12:46 PM  Cristian Gonzales  has presented today for surgery, with the diagnosis of STROKE.  The various methods of treatment have been discussed with the patient and family. After consideration of risks, benefits and other options for treatment, the patient has consented to  Procedure(s): TRANSESOPHAGEAL ECHOCARDIOGRAM (TEE) (N/A) as a surgical intervention.  The patient's history has been reviewed, patient examined, no change in status, stable for surgery.  I have reviewed the patient's chart and labs.  Questions were answered to the patient's satisfaction.     Charlton Haws

## 2021-03-19 NOTE — Progress Notes (Signed)
Lower extremity venous has been completed.   Preliminary results in CV Proc.   Blanch Media 03/19/2021 9:55 AM

## 2021-03-19 NOTE — Anesthesia Preprocedure Evaluation (Addendum)
Anesthesia Evaluation  Patient identified by MRN, date of birth, ID band Patient awake    Reviewed: Allergy & Precautions, NPO status , Patient's Chart, lab work & pertinent test results  History of Anesthesia Complications Negative for: history of anesthetic complications  Airway Mallampati: III  TM Distance: >3 FB Neck ROM: Full    Dental  (+) Dental Advisory Given   Pulmonary asthma , Current Smoker and Patient abstained from smoking.,    Pulmonary exam normal        Cardiovascular Normal cardiovascular exam   '22 TTE - EF 60 to 65%. There is mild concentric left ventricular hypertrophy. Grade I diastolic dysfunction (impaired relaxation). Trivial mitral valve regurgitation. There is mild dilatation of the ascending aorta, measuring 42 mm.     Neuro/Psych PSYCHIATRIC DISORDERS Anxiety Depression CVA, No Residual Symptoms    GI/Hepatic negative GI ROS, Neg liver ROS,   Endo/Other  Morbid obesity  Renal/GU negative Renal ROS     Musculoskeletal  (+) Arthritis ,   Abdominal   Peds  Hematology negative hematology ROS (+)   Anesthesia Other Findings   Reproductive/Obstetrics                            Anesthesia Physical Anesthesia Plan  ASA: 4  Anesthesia Plan: MAC   Post-op Pain Management:    Induction:   PONV Risk Score and Plan: 1 and Propofol infusion and Treatment may vary due to age or medical condition  Airway Management Planned: Nasal Cannula and Natural Airway  Additional Equipment: None  Intra-op Plan:   Post-operative Plan:   Informed Consent: I have reviewed the patients History and Physical, chart, labs and discussed the procedure including the risks, benefits and alternatives for the proposed anesthesia with the patient or authorized representative who has indicated his/her understanding and acceptance.       Plan Discussed with: CRNA and  Anesthesiologist  Anesthesia Plan Comments:        Anesthesia Quick Evaluation

## 2021-03-19 NOTE — Discharge Summary (Addendum)
Stroke Discharge Summary  Patient ID: revis Cristian Gonzales   MRN: [id number]      DOB: 16-Sep-1955  Date of Admission: 03/17/2021 Date of Discharge: 03/19/2021  Attending Physician:  Stroke, Md, MD, Stroke MD Consultant(s):    cardiology  Patient's PCP:  Cristian Frees, NP  DISCHARGE DIAGNOSIS:  Active Problems:   Stroke (cerebrum) (HCC)   Allergies as of 03/19/2021       Reactions   Tramadol    Pass Out   Trazodone And Nefazodone Other (See Comments)   Flushness, blurred vision, muscle cramps         Medication List     TAKE these medications    acetaminophen 325 MG tablet Commonly known as: TYLENOL Take 2 tablets (650 mg total) by mouth every 4 (four) hours as needed for mild pain (or temp > 37.5 C (99.5 F)).   aspirin 81 MG EC tablet Take 1 tablet (81 mg total) by mouth daily. Swallow whole. Start taking on: March 20, 2021   atorvastatin 80 MG tablet Commonly known as: LIPITOR Take 1 tablet (80 mg total) by mouth daily. Start taking on: March 20, 2021   clopidogrel 75 MG tablet Commonly known as: PLAVIX Take 1 tablet (75 mg total) by mouth daily. Start taking on: March 20, 2021   naproxen 500 MG tablet Commonly known as: Naprosyn Take 1 tablet (500 mg total) by mouth 2 (two) times daily with a meal.       ASK your doctor about these medications    FLUoxetine 20 MG capsule Commonly known as: PROZAC TAKE 1 CAPSULE BY MOUTH DAILY        LABORATORY STUDIES CBC    Component Value Date/Time   WBC 9.4 03/17/2021 0033   RBC 4.80 03/17/2021 0033   HGB 14.6 03/17/2021 0035   HCT 43.0 03/17/2021 0035   PLT 282 03/17/2021 0033   MCV 93.1 03/17/2021 0033   MCH 29.6 03/17/2021 0033   MCHC 31.8 03/17/2021 0033   RDW 13.8 03/17/2021 0033   LYMPHSABS 2.5 03/17/2021 0033   MONOABS 0.8 03/17/2021 0033   EOSABS 0.4 03/17/2021 0033   BASOSABS 0.1 03/17/2021 0033   CMP    Component Value Date/Time   NA 140 03/17/2021 0035   K 3.8 03/17/2021 0035   CL  105 03/17/2021 0035   CO2 27 03/17/2021 0033   GLUCOSE 101 (H) 03/17/2021 0035   GLUCOSE 88 06/21/2006 1046   BUN 17 03/17/2021 0035   CREATININE 0.90 03/17/2021 0035   CALCIUM 9.1 03/17/2021 0033   PROT 6.6 03/17/2021 0033   ALBUMIN 3.8 03/17/2021 0033   AST 19 03/17/2021 0033   ALT 24 03/17/2021 0033   ALKPHOS 80 03/17/2021 0033   BILITOT 0.5 03/17/2021 0033   GFRNONAA >60 03/17/2021 0033   GFRAA >60 06/09/2020 1244   COAGS Lab Results  Component Value Date   INR 0.9 03/17/2021   INR 0.90 11/27/2012   INR 1.1 10/10/2008   Lipid Panel    Component Value Date/Time   CHOL 232 (H) 03/17/2021 0428   TRIG 78 03/17/2021 0428   TRIG 72 06/21/2006 1046   HDL 46 03/17/2021 0428   CHOLHDL 5.0 03/17/2021 0428   VLDL 16 03/17/2021 0428   LDLCALC 170 (H) 03/17/2021 0428   HgbA1C  Lab Results  Component Value Date   HGBA1C 5.7 (H) 03/17/2021   Urinalysis    Component Value Date/Time   COLORURINE YELLOW 03/17/2021 0211   APPEARANCEUR  CLEAR 03/17/2021 0211   LABSPEC 1.045 (H) 03/17/2021 0211   PHURINE 6.0 03/17/2021 0211   GLUCOSEU NEGATIVE 03/17/2021 0211   HGBUR NEGATIVE 03/17/2021 0211   HGBUR negative 10/31/2009 0923   BILIRUBINUR NEGATIVE 03/17/2021 0211   BILIRUBINUR n 07/25/2015 0942   KETONESUR NEGATIVE 03/17/2021 0211   PROTEINUR NEGATIVE 03/17/2021 0211   UROBILINOGEN 0.2 07/25/2015 0942   UROBILINOGEN 0.2 01/26/2010 1938   NITRITE NEGATIVE 03/17/2021 0211   LEUKOCYTESUR NEGATIVE 03/17/2021 0211   Urine Drug Screen     Component Value Date/Time   LABOPIA NONE DETECTED 03/17/2021 0211   COCAINSCRNUR NONE DETECTED 03/17/2021 0211   LABBENZ NONE DETECTED 03/17/2021 0211   AMPHETMU NONE DETECTED 03/17/2021 0211   THCU NONE DETECTED 03/17/2021 0211   LABBARB NONE DETECTED 03/17/2021 0211    Alcohol Level    Component Value Date/Time   ETH <10 03/17/2021 0033     SIGNIFICANT DIAGNOSTIC STUDIES MR BRAIN WO CONTRAST  Result Date: 03/18/2021 CLINICAL  DATA:  Stroke follow-up.  Right upper extremity weakness. EXAM: MRI HEAD WITHOUT CONTRAST TECHNIQUE: Multiplanar, multiecho pulse sequences of the brain and surrounding structures were obtained without intravenous contrast. COMPARISON:  None. FINDINGS: Brain: Multifocal acute ischemia within the left MCA territory. Mild edema without mass effect. Parenchymal signal is otherwise normal. No acute or chronic hemorrhage. Normal white matter signal, parenchymal volume and CSF spaces. The midline structures are normal. Vascular: Major flow voids are preserved. Skull and upper cervical spine: Normal calvarium and skull base. Visualized upper cervical spine and soft tissues are normal. Sinuses/Orbits:No paranasal sinus fluid levels or advanced mucosal thickening. No mastoid or middle ear effusion. Normal orbits. IMPRESSION: Multifocal acute ischemia within the left MCA territory. No hemorrhage or mass effect. Electronically Signed   By: Deatra Robinson M.D.   On: 03/18/2021 01:51   MR CERVICAL SPINE WO CONTRAST  Result Date: 03/18/2021 CLINICAL DATA:  Right upper extremity weakness EXAM: MRI CERVICAL SPINE WITHOUT CONTRAST TECHNIQUE: Multiplanar, multisequence MR imaging of the cervical spine was performed. No intravenous contrast was administered. COMPARISON:  None. FINDINGS: Alignment: Grade 1 retrolisthesis at C5-6 Vertebrae: C6-7 ACDF Cord: Normal signal and morphology. Posterior Fossa, vertebral arteries, paraspinal tissues: Negative. Disc levels: C1-2: Unremarkable. C2-3: Normal disc space and facet joints. There is no spinal canal stenosis. No neural foraminal stenosis. C3-4: Normal disc space and facet joints. There is no spinal canal stenosis. No neural foraminal stenosis. C4-5: Small disc bulge with bilateral uncovertebral hypertrophy. Mild spinal canal stenosis. Mild right and severe left neural foraminal stenosis. C5-6: Intermediate sized disc bulge with bilateral uncovertebral hypertrophy. Mild spinal canal  stenosis. Severe bilateral neural foraminal stenosis. C6-7: ACDF. There is no spinal canal stenosis. No neural foraminal stenosis. C7-T1: Normal disc space and facet joints. There is no spinal canal stenosis. No neural foraminal stenosis. IMPRESSION: 1. Mild spinal canal stenosis at C4-5 and C5-6. 2. 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 Electronically Signed   By: Deatra Robinson M.D.   On: 03/18/2021 01:56   ECHOCARDIOGRAM COMPLETE  Result Date: 03/17/2021    ECHOCARDIOGRAM REPORT   Patient Name:   Cristian Gonzales Date of Exam: 03/17/2021 Medical Rec #:  [id number]        Height:       65.0 in Accession #:    [id number]       Weight:       243.2 lb Date of Birth:  1955/12/08        BSA:  2.149 m Patient Age:    65 years         BP:           132/80 mmHg Patient Gender: M                HR:           70 bpm. Exam Location:  Inpatient Procedure: 2D Echo, 3D Echo, Cardiac Doppler and Color Doppler Indications:    Stroke  History:        Patient has prior history of Echocardiogram examinations, most                 recent 11/28/2012. Stroke, Signs/Symptoms:Syncope; Risk                 Factors:Dyslipidemia.  Sonographer:    Sheralyn Boatman RDCS Referring Phys: [id number] ERIC LINDZEN  Sonographer Comments: Technically difficult study due to poor echo windows. Image acquisition challenging due to patient body habitus. IMPRESSIONS  1. Left ventricular ejection fraction, by estimation, is 60 to 65%. Left ventricular ejection fraction by 3D volume is 58 %. The left ventricle has normal function. The left ventricle has no regional wall motion abnormalities. There is mild concentric left ventricular hypertrophy. Left ventricular diastolic parameters are consistent with Grade I diastolic dysfunction (impaired relaxation).  2. Right ventricular systolic function is normal. The right ventricular size is normal. Tricuspid regurgitation signal is inadequate for assessing PA pressure.  3. The mitral valve is  normal in structure. Trivial mitral valve regurgitation. No evidence of mitral stenosis.  4. The aortic valve is normal in structure. Aortic valve regurgitation is not visualized. No aortic stenosis is present.  5. Aortic dilatation noted. There is mild dilatation of the ascending aorta, measuring 42 mm.  6. The inferior vena cava is dilated in size with >50% respiratory variability, suggesting right atrial pressure of 8 mmHg. FINDINGS  Left Ventricle: Left ventricular ejection fraction, by estimation, is 60 to 65%. Left ventricular ejection fraction by 3D volume is 58 %. The left ventricle has normal function. The left ventricle has no regional wall motion abnormalities. The left ventricular internal cavity size was normal in size. There is mild concentric left ventricular hypertrophy. Left ventricular diastolic parameters are consistent with Grade I diastolic dysfunction (impaired relaxation). Normal left ventricular filling pressure. Right Ventricle: The right ventricular size is normal. No increase in right ventricular wall thickness. Right ventricular systolic function is normal. Tricuspid regurgitation signal is inadequate for assessing PA pressure. Left Atrium: Left atrial size was normal in size. Right Atrium: Right atrial size was normal in size. Pericardium: There is no evidence of pericardial effusion. Mitral Valve: The mitral valve is normal in structure. Trivial mitral valve regurgitation. No evidence of mitral valve stenosis. Tricuspid Valve: The tricuspid valve is normal in structure. Tricuspid valve regurgitation is trivial. No evidence of tricuspid stenosis. Aortic Valve: The aortic valve is normal in structure. Aortic valve regurgitation is not visualized. No aortic stenosis is present. Pulmonic Valve: The pulmonic valve was normal in structure. Pulmonic valve regurgitation is not visualized. No evidence of pulmonic stenosis. Aorta: Aortic dilatation noted. There is mild dilatation of the ascending  aorta, measuring 42 mm. Venous: The inferior vena cava is dilated in size with greater than 50% respiratory variability, suggesting right atrial pressure of 8 mmHg. IAS/Shunts: No atrial level shunt detected by color flow Doppler.  LEFT VENTRICLE PLAX 2D LVIDd:         5.10 cm  Diastology LVIDs:         3.00 cm         LV e' medial:    5.44 cm/s LV PW:         1.30 cm         LV E/e' medial:  13.3 LV IVS:        1.20 cm         LV e' lateral:   8.81 cm/s LVOT diam:     2.10 cm         LV E/e' lateral: 8.2 LV SV:         87 LV SV Index:   40 LVOT Area:     3.46 cm        3D Volume EF                                LV 3D EF:    Left                                             ventricular LV Volumes (MOD)                            ejection LV vol d, MOD    96.1 ml                    fraction by A2C:                                        3D volume LV vol d, MOD    125.0 ml                   is 58 %. A4C: LV vol s, MOD    29.9 ml A2C:                           3D Volume EF: LV vol s, MOD    29.1 ml       3D EF:        58 % A4C:                           LV EDV:       139 ml LV SV MOD A2C:   66.2 ml       LV ESV:       58 ml LV SV MOD A4C:   125.0 ml      LV SV:        81 ml LV SV MOD BP:    78.1 ml RIGHT VENTRICLE             IVC RV S prime:     18.40 cm/s  IVC diam: 2.10 cm TAPSE (M-mode): 2.9 cm LEFT ATRIUM             Index       RIGHT ATRIUM           Index LA diam:        3.50 cm 1.63 cm/m  RA Area:     17.40 cm LA  Vol [facility]):   26.2 ml 12.19 ml/m RA Volume:   44.50 ml  20.70 ml/m LA Vol (A4C):   28.8 ml 13.40 ml/m LA Biplane Vol: 28.1 ml 13.07 ml/m  AORTIC VALVE LVOT Vmax:   135.00 cm/s LVOT Vmean:  92.500 cm/s LVOT VTI:    0.251 m  AORTA Ao Root diam: 2.80 cm Ao Asc diam:  4.20 cm MITRAL VALVE MV Area (PHT): 2.60 cm    SHUNTS MV Decel Time: 292 msec    Systemic VTI:  0.25 m MV E velocity: 72.10 cm/s  Systemic Diam: 2.10 cm MV A velocity: 84.80 cm/s MV E/A ratio:  0.85 Armanda Magic MD Electronically  signed by Armanda Magic MD Signature Date/Time: 03/17/2021/11:18:05 AM    Final    ECHO TEE  Result Date: 03/19/2021    TRANSESOPHOGEAL ECHO REPORT   Patient Name:   Cristian Gonzales Date of Exam: 03/19/2021 Medical Rec #:  [id number]        Height:       65.0 in Accession #:    [id number]       Weight:       243.2 lb Date of Birth:  1956/02/02        BSA:          2.149 m Patient Age:    65 years         BP:           137/119 mmHg Patient Gender: M                HR:           73 bpm. Exam Location:  Inpatient Procedure: Transesophageal Echo, Color Doppler and Saline Contrast Bubble Study Indications:     Stroke i63.9  History:         Patient has prior history of Echocardiogram examinations, most                  recent 03/17/2021. Risk Factors:Dyslipidemia.  Sonographer:     Irving Burton Senior RDCS Referring Phys:  [id number] Ernest Haber ROBERTS Diagnosing Phys: Charlton Haws MD PROCEDURE: After discussion of the risks and benefits of a TEE, an informed consent was obtained from the patient. The transesophogeal probe was passed without difficulty through the esophogus of the patient. Sedation performed by different physician. The patient was monitored while under deep sedation. Anesthestetic sedation was provided intravenously by Anesthesiology:  of Propofol. The patient's vital signs; including heart rate, blood pressure, and oxygen saturation; remained stable throughout the procedure. The patient developed no complications during the procedure. IMPRESSIONS  1. Small PFO seen 2D not color with minimally positive bubble study Would proceed with ILR implant.  2. Left ventricular ejection fraction, by estimation, is 55%. The left ventricle has normal function. The left ventricle has no regional wall motion abnormalities.  3. Right ventricular systolic function is normal. The right ventricular size is normal.  4. Left atrial size was mildly dilated. No left atrial/left atrial appendage thrombus was detected.  5. The mitral  valve is normal in structure. Mild mitral valve regurgitation. No evidence of mitral stenosis.  6. The aortic valve is tricuspid. Aortic valve regurgitation is trivial. No aortic stenosis is present.  7. The inferior vena cava is normal in size with greater than 50% respiratory variability, suggesting right atrial pressure of 3 mmHg.  8. Evidence of atrial level shunting detected by color flow Doppler. Agitated saline contrast bubble study was positive with shunting observed within 3-6  cardiac cycles suggestive of interatrial shunt. Conclusion(s)/Recommendation(s): Normal biventricular function without evidence of hemodynamically significant valvular heart disease. FINDINGS  Left Ventricle: Left ventricular ejection fraction, by estimation, is 55%. The left ventricle has normal function. The left ventricle has no regional wall motion abnormalities. The left ventricular internal cavity size was normal in size. There is no left ventricular hypertrophy. Right Ventricle: The right ventricular size is normal. No increase in right ventricular wall thickness. Right ventricular systolic function is normal. Left Atrium: Left atrial size was mildly dilated. No left atrial/left atrial appendage thrombus was detected. Right Atrium: Right atrial size was normal in size. Pericardium: There is no evidence of pericardial effusion. Mitral Valve: The mitral valve is normal in structure. Mild mitral valve regurgitation. No evidence of mitral valve stenosis. Tricuspid Valve: The tricuspid valve is normal in structure. Tricuspid valve regurgitation is not demonstrated. No evidence of tricuspid stenosis. Aortic Valve: The aortic valve is tricuspid. Aortic valve regurgitation is trivial. No aortic stenosis is present. Pulmonic Valve: The pulmonic valve was normal in structure. Pulmonic valve regurgitation is not visualized. No evidence of pulmonic stenosis. Aorta: The aortic root is normal in size and structure. Venous: The inferior vena  cava is normal in size with greater than 50% respiratory variability, suggesting right atrial pressure of 3 mmHg. IAS/Shunts: Evidence of atrial level shunting detected by color flow Doppler. Agitated saline contrast was given intravenously to evaluate for intracardiac shunting. Agitated saline contrast bubble study was positive with shunting observed within 3-6 cardiac cycles suggestive of interatrial shunt. Additional Comments: Small PFO seen 2D not color with minimally positive bubble study Would proceed with ILR implant. Charlton HawsPeter Nishan MD Electronically signed by Charlton HawsPeter Nishan MD Signature Date/Time: 03/19/2021/1:25:14 PM    Final    CT HEAD CODE STROKE WO CONTRAST  Result Date: 03/17/2021 CLINICAL DATA:  Code stroke.  Right-sided weakness EXAM: CT HEAD WITHOUT CONTRAST TECHNIQUE: Contiguous axial images were obtained from the base of the skull through the vertex without intravenous contrast. COMPARISON:  07/20/2016 FINDINGS: Brain: There is no mass, hemorrhage or extra-axial collection. The size and configuration of the ventricles and extra-axial CSF spaces are normal. The brain parenchyma is normal, without evidence of acute or chronic infarction. Vascular: No abnormal hyperdensity of the major intracranial arteries or dural venous sinuses. No intracranial atherosclerosis. Skull: The visualized skull base, calvarium and extracranial soft tissues are normal. Sinuses/Orbits: No fluid levels or advanced mucosal thickening of the visualized paranasal sinuses. No mastoid or middle ear effusion. The orbits are normal. ASPECTS [facility]([place] Stroke Program Early CT Score) - Ganglionic level infarction (caudate, lentiform nuclei, internal capsule, insula, M1-M3 cortex): 7 - Supraganglionic infarction (M4-M6 cortex): 3 Total score (0-10 with 10 being normal): 10 IMPRESSION: 1. Normal head CT. 2. ASPECTS is 10. These results were communicated to Dr. Caryl PinaEric Lindzen at 12:42 am on 03/17/2021 by text page via the [facility] messaging  system. Electronically Signed   By: Deatra RobinsonKevin  Herman M.D.   On: 03/17/2021 00:42   VAS US LOWER EXTREMITY VENOUS (DVT)  Result Date: 03/19/2021  Lower Venous DVT Study Patient Name:  Cristian Gonzales  Date of Exam:   03/19/2021 Medical Rec #: [id number]         Accession #:    [id number] Date of Birth: [birth year]         Patient Gender: M Patient Age:   65Y [facility] Procedure:      VAS US LOWER EXTREMITY VENOUS (DVT) Referring Phys: [id number]  Kafi Dotter --------------------------------------------------------------------------------  Indications: Stroke.  Comparison Study: no prior Performing Technologist: [place] Ponder RVS  Examination Guidelines: A complete evaluation includes B-mode imaging, spectral Doppler, color Doppler, and power Doppler as needed of all accessible portions of each vessel. Bilateral testing is considered an integral part of a complete examination. Limited examinations for reoccurring indications may be performed as noted. The reflux portion of the exam is performed with the patient in reverse Trendelenburg.  +---------+---------------+---------+-----------+----------+--------------+ RIGHT    CompressibilityPhasicitySpontaneityPropertiesThrombus Aging +---------+---------------+---------+-----------+----------+--------------+ CFV      Full           Yes      Yes                                 +---------+---------------+---------+-----------+----------+--------------+ SFJ      Full                                                        +---------+---------------+---------+-----------+----------+--------------+ FV Prox  Full                                                        +---------+---------------+---------+-----------+----------+--------------+ FV Mid   Full                                                        +---------+---------------+---------+-----------+----------+--------------+ FV DistalFull                                                         +---------+---------------+---------+-----------+----------+--------------+ PFV      Full                                                        +---------+---------------+---------+-----------+----------+--------------+ POP      Full           Yes      Yes                                 +---------+---------------+---------+-----------+----------+--------------+ PTV      Full                                                        +---------+---------------+---------+-----------+----------+--------------+ PERO     Full                                                        +---------+---------------+---------+-----------+----------+--------------+   +---------+---------------+---------+-----------+----------+--------------+  LEFT     CompressibilityPhasicitySpontaneityPropertiesThrombus Aging +---------+---------------+---------+-----------+----------+--------------+ CFV      Full           Yes      Yes                                 +---------+---------------+---------+-----------+----------+--------------+ SFJ      Full                                                        +---------+---------------+---------+-----------+----------+--------------+ FV Prox  Full                                                        +---------+---------------+---------+-----------+----------+--------------+ FV Mid   Full                                                        +---------+---------------+---------+-----------+----------+--------------+ FV DistalFull                                                        +---------+---------------+---------+-----------+----------+--------------+ PFV      Full                                                        +---------+---------------+---------+-----------+----------+--------------+ POP      Full           Yes      Yes                                  +---------+---------------+---------+-----------+----------+--------------+ PTV      Full                                                        +---------+---------------+---------+-----------+----------+--------------+ PERO     Full                                                        +---------+---------------+---------+-----------+----------+--------------+     Summary: BILATERAL: - No evidence of deep vein thrombosis seen in the lower extremities, bilaterally. -No evidence of popliteal cyst, bilaterally.   *See table(s) above for measurements and observations.    Preliminary    CT ANGIO HEAD CODE STROKE  Result Date: 03/17/2021 CLINICAL DATA:  Ataxia and right arm weakness EXAM: CT ANGIOGRAPHY HEAD AND NECK TECHNIQUE: Multidetector CT imaging of the head and neck was performed using the standard protocol during bolus administration of intravenous contrast. Multiplanar CT image reconstructions and MIPs were obtained to evaluate the vascular anatomy. Carotid stenosis measurements (when applicable) are obtained utilizing NASCET criteria, using the distal internal carotid diameter as the denominator. CONTRAST:  78mL OMNIPAQUE IOHEXOL 350 MG/ML SOLN COMPARISON:  None. FINDINGS: CTA NECK FINDINGS SKELETON: There is no bony spinal canal stenosis. No lytic or blastic lesion. OTHER NECK: Normal pharynx, larynx and major salivary glands. No cervical lymphadenopathy. Unremarkable thyroid gland. UPPER CHEST: No pneumothorax or pleural effusion. No nodules or masses. AORTIC ARCH: There is calcific atherosclerosis of the aortic arch. There is no aneurysm, dissection or hemodynamically significant stenosis of the visualized portion of the aorta. Conventional 3 vessel aortic branching pattern. The visualized proximal subclavian arteries are widely patent. RIGHT CAROTID SYSTEM: No dissection, occlusion or aneurysm. There is low density atherosclerosis extending into the proximal ICA, resulting in 50%  stenosis. LEFT CAROTID SYSTEM: No dissection, occlusion or aneurysm. There is low density atherosclerosis extending into the proximal ICA, resulting in 50% stenosis. VERTEBRAL ARTERIES: Codominant configuration. Both origins are clearly patent. There is no dissection, occlusion or flow-limiting stenosis to the skull base (V1-V3 segments). CTA HEAD FINDINGS POSTERIOR CIRCULATION: --Vertebral arteries: Normal V4 segments. --Inferior cerebellar arteries: Normal. --Basilar artery: Normal. --Superior cerebellar arteries: Normal. --Posterior cerebral arteries (PCA): Normal. ANTERIOR CIRCULATION: --Intracranial internal carotid arteries: Normal. --Anterior cerebral arteries (ACA): Normal. Both A1 segments are present. Patent anterior communicating artery (a-comm). --Middle cerebral arteries (MCA): Normal.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

## 2021-03-19 NOTE — Plan of Care (Signed)

## 2021-03-19 NOTE — Discharge Instructions (Signed)

## 2021-03-19 NOTE — Transfer of Care (Signed)
Immediate Anesthesia Transfer of Care Note  Patient: Tavarus Poteete Olver  Procedure(s) Performed: TRANSESOPHAGEAL ECHOCARDIOGRAM (TEE) BUBBLE STUDY  Patient Location: Endoscopy Unit  Anesthesia Type:MAC  Level of Consciousness: awake and alert   Airway & Oxygen Therapy: Patient Spontanous Breathing  Post-op Assessment: Report given to RN and Post -op Vital signs reviewed and stable  Post vital signs: Reviewed and stable  Last Vitals:  Vitals Value Taken Time  BP 137/119 03/19/21 1315  Temp    Pulse 70 03/19/21 1317  Resp 16 03/19/21 1317  SpO2 97 % 03/19/21 1317  Vitals shown include unvalidated device data.  Last Pain:  Vitals:   03/19/21 1314  TempSrc:   PainSc: 0-No pain         Complications: No notable events documented.

## 2021-03-20 ENCOUNTER — Telehealth: Payer: Self-pay

## 2021-03-20 ENCOUNTER — Encounter (HOSPITAL_COMMUNITY): Payer: Self-pay | Admitting: Cardiology

## 2021-03-20 NOTE — Telephone Encounter (Cosign Needed)
Transition Care Management Unsuccessful Follow-up Telephone Call  Date of discharge and from where:  03/19/21 [facility]   Attempts:  1st Attempt  Reason for unsuccessful TCM follow-up call:  Left voice message

## 2021-03-30 ENCOUNTER — Other Ambulatory Visit: Payer: Self-pay

## 2021-03-31 ENCOUNTER — Encounter: Payer: Self-pay | Admitting: Adult Health

## 2021-03-31 ENCOUNTER — Ambulatory Visit: Payer: 59 | Admitting: Adult Health

## 2021-03-31 VITALS — BP 106/70 | HR 81 | Temp 98.9°F | Ht 65.0 in | Wt 234.0 lb

## 2021-03-31 DIAGNOSIS — Z8673 Personal history of transient ischemic attack (TIA), and cerebral infarction without residual deficits: Secondary | ICD-10-CM

## 2021-03-31 DIAGNOSIS — Z720 Tobacco use: Secondary | ICD-10-CM | POA: Diagnosis not present

## 2021-03-31 DIAGNOSIS — E782 Mixed hyperlipidemia: Secondary | ICD-10-CM

## 2021-03-31 DIAGNOSIS — F39 Unspecified mood [affective] disorder: Secondary | ICD-10-CM | POA: Diagnosis not present

## 2021-03-31 DIAGNOSIS — M4802 Spinal stenosis, cervical region: Secondary | ICD-10-CM

## 2021-03-31 DIAGNOSIS — E668 Other obesity: Secondary | ICD-10-CM

## 2021-03-31 DIAGNOSIS — I63412 Cerebral infarction due to embolism of left middle cerebral artery: Secondary | ICD-10-CM

## 2021-03-31 MED ORDER — ATORVASTATIN CALCIUM 80 MG PO TABS: 80.0000 mg | ORAL_TABLET | Freq: Every day | ORAL | 3 refills | Status: DC
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
## 2021-03-31 NOTE — Patient Instructions (Addendum)
I have referred you to Neurology and refilled your Lipitor   Please follow up in 3-6 months for a physical exam

## 2021-04-01 ENCOUNTER — Other Ambulatory Visit: Payer: Self-pay

## 2021-04-01 ENCOUNTER — Ambulatory Visit (INDEPENDENT_AMBULATORY_CARE_PROVIDER_SITE_OTHER): Payer: 59

## 2021-04-01 DIAGNOSIS — I63412 Cerebral infarction due to embolism of left middle cerebral artery: Secondary | ICD-10-CM

## 2021-04-01 LAB — CUP PACEART INCLINIC DEVICE CHECK
Date Time Interrogation Session: 20220727114647
Implantable Pulse Generator Implant Date: 20220714

## 2021-04-01 NOTE — Progress Notes (Signed)
Patient seen in device clinic for ILR wound check. Steri-strips removed prior to OV. Wound appears well healed and no redness, swelling or drainage noted. Carelink is UTD with no event logged. Next remote scheduled 05/04/21.

## 2021-04-07 ENCOUNTER — Telehealth: Payer: Self-pay | Admitting: Adult Health

## 2021-04-07 ENCOUNTER — Other Ambulatory Visit: Payer: Self-pay | Admitting: Adult Health

## 2021-04-07 MED ORDER — ZOLPIDEM TARTRATE 5 MG PO TABS: 5.0000 mg | ORAL_TABLET | Freq: Every evening | ORAL | 1 refills | Status: DC | PRN

## 2021-04-07 NOTE — Telephone Encounter (Signed)
PT called to advise that he is still waiting to hear about the Ambien and Cholesterol medicine that Kandee Keen had talked about during his visit last week and want some followup.

## 2021-04-07 NOTE — Telephone Encounter (Signed)
Please advise 

## 2021-04-08 NOTE — Telephone Encounter (Signed)
Left message to return phone call.

## 2021-04-08 NOTE — Telephone Encounter (Signed)
Called pt but no answer will try again later

## 2021-04-13 ENCOUNTER — Other Ambulatory Visit: Payer: Self-pay

## 2021-04-13 ENCOUNTER — Emergency Department (HOSPITAL_COMMUNITY)
Admission: EM | Admit: 2021-04-13 | Discharge: 2021-04-13 | Disposition: A | Discharge status: Home / Self Care | Payer: 59 | Attending: Emergency Medicine | Admitting: Emergency Medicine

## 2021-04-13 ENCOUNTER — Encounter (HOSPITAL_COMMUNITY): Payer: Self-pay | Admitting: Emergency Medicine

## 2021-04-13 DIAGNOSIS — R42 Dizziness and giddiness: Secondary | ICD-10-CM | POA: Diagnosis not present

## 2021-04-13 DIAGNOSIS — R0789 Other chest pain: Secondary | ICD-10-CM | POA: Diagnosis not present

## 2021-04-13 DIAGNOSIS — F17290 Nicotine dependence, other tobacco product, uncomplicated: Secondary | ICD-10-CM | POA: Insufficient documentation

## 2021-04-13 DIAGNOSIS — Z7902 Long term (current) use of antithrombotics/antiplatelets: Secondary | ICD-10-CM | POA: Diagnosis not present

## 2021-04-13 DIAGNOSIS — J45998 Other asthma: Secondary | ICD-10-CM | POA: Diagnosis not present

## 2021-04-13 DIAGNOSIS — R519 Headache, unspecified: Secondary | ICD-10-CM | POA: Insufficient documentation

## 2021-04-13 DIAGNOSIS — Z7982 Long term (current) use of aspirin: Secondary | ICD-10-CM | POA: Diagnosis not present

## 2021-04-13 DIAGNOSIS — Z96651 Presence of right artificial knee joint: Secondary | ICD-10-CM | POA: Diagnosis not present

## 2021-04-13 DIAGNOSIS — G44209 Tension-type headache, unspecified, not intractable: Secondary | ICD-10-CM

## 2021-04-13 HISTORY — DX: Cerebral infarction, unspecified: I63.9

## 2021-04-13 LAB — CBC WITH DIFFERENTIAL/PLATELET
Abs Immature Granulocytes: 0.04 10*3/uL (ref 0.00–0.07)
Basophils Absolute: 0.1 10*3/uL (ref 0.0–0.1)
Basophils Relative: 1 %
Eosinophils Absolute: 0.2 10*3/uL (ref 0.0–0.5)
Eosinophils Relative: 3 %
HCT: 43.4 % (ref 39.0–52.0)
Hemoglobin: 14.1 g/dL (ref 13.0–17.0)
Immature Granulocytes: 1 %
Lymphocytes Relative: 23 %
Lymphs Abs: 1.8 10*3/uL (ref 0.7–4.0)
MCH: 30.3 pg (ref 26.0–34.0)
MCHC: 32.5 g/dL (ref 30.0–36.0)
MCV: 93.3 fL (ref 80.0–100.0)
Monocytes Absolute: 0.6 10*3/uL (ref 0.1–1.0)
Monocytes Relative: 7 %
Neutro Abs: 5.3 10*3/uL (ref 1.7–7.7)
Neutrophils Relative %: 65 %
Platelets: 257 10*3/uL (ref 150–400)
RBC: 4.65 MIL/uL (ref 4.22–5.81)
RDW: 13.7 % (ref 11.5–15.5)
WBC: 8 10*3/uL (ref 4.0–10.5)
nRBC: 0 % (ref 0.0–0.2)

## 2021-04-13 LAB — COMPREHENSIVE METABOLIC PANEL
ALT: 30 U/L (ref 0–44)
AST: 23 U/L (ref 15–41)
Albumin: 3.8 g/dL (ref 3.5–5.0)
Alkaline Phosphatase: 86 U/L (ref 38–126)
Anion gap: 7 (ref 5–15)
BUN: 17 mg/dL (ref 8–23)
CO2: 26 mmol/L (ref 22–32)
Calcium: 9 mg/dL (ref 8.9–10.3)
Chloride: 105 mmol/L (ref 98–111)
Creatinine, Ser: 0.97 mg/dL (ref 0.61–1.24)
GFR, Estimated: >60 mL/min (ref >60)
Glucose, Bld: 112 mg/dL — ABNORMAL HIGH (ref 70–99)
Potassium: 4.2 mmol/L (ref 3.5–5.1)
Sodium: 138 mmol/L (ref 135–145)
Total Bilirubin: 0.8 mg/dL (ref 0.3–1.2)
Total Protein: 6.4 g/dL — ABNORMAL LOW (ref 6.5–8.1)

## 2021-04-13 LAB — TROPONIN I (HIGH SENSITIVITY)
Troponin I (High Sensitivity): 5 ng/L (ref <18)
Troponin I (High Sensitivity): 4 ng/L (ref <18)

## 2021-04-13 MED ORDER — PROCHLORPERAZINE EDISYLATE 10 MG/2ML IJ SOLN
10.0000 mg | Freq: Once | INTRAMUSCULAR | Status: AC
  Administered 2021-04-13: 10 mg via INTRAVENOUS
  Filled 2021-04-13: qty 2

## 2021-04-13 MED ORDER — LACTATED RINGERS IV BOLUS
1000.0000 mL | Freq: Once | INTRAVENOUS | Status: AC
  Administered 2021-04-13: 1000 mL via INTRAVENOUS

## 2021-04-13 MED ORDER — DIPHENHYDRAMINE HCL 50 MG/ML IJ SOLN
25.0000 mg | Freq: Once | INTRAMUSCULAR | Status: AC
  Administered 2021-04-13: 25 mg via INTRAVENOUS
  Filled 2021-04-13: qty 1

## 2021-04-13 NOTE — ED Provider Notes (Signed)
[facility] EMERGENCY DEPARTMENT Provider Note   CSN: [id number] Arrival date & time: 04/13/21  0059     History Chief Complaint  Patient presents with   Chest Tightness / Lightheaded    Cristian Gonzales is a 65 y.o. male.  The history is provided by the patient.  He has history of hyperlipidemia, stroke, depression and woke up at about 12:30 PM with dizziness and a headache.  Dizziness is described as like a panic attack.  He denies vertigo, lightheadedness.  Headache is a dull pain across the occiput which he rates at 7/10.  Nothing makes it better, nothing makes it worse.  He denies photophobia or phonophobia.  He has not taken anything for his headache.  He was concerned because his stroke was just last month.  He had received tPA and is currently wearing a loop recorder.  Since the loop recorder had been inserted, he has had some dull aching in the midsternal area where the recorder was placed.  This has been constant since he left the hospital and has not changed tonight.  He denies dyspnea, nausea, diaphoresis.  He has been ambulatory tonight with out any weakness or numbness.   Past Medical History:  Diagnosis Date   Anxiety    Arthritis    Cervical spine fracture (HCC)    13 -diving accident, no neurolgic sequelae   Depression    Stroke (HCC)    Syncope 11/27/2012   From Tramadol    Patient Active Problem List   Diagnosis Date Noted   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   Hyperlipidemia 07/30/2015   Lumbar disc disease with radiculopathy 07/29/2014   Left-sided thoracic back pain 06/05/2014   Numbness and tingling sensation of skin 06/25/2013   Syncope 11/27/2012   Depression 11/27/2012   Epididymitis, left 11/23/2010   ALLERGIC RHINITIS 11/07/2009   EXTRINSIC ASTHMA, UNSPECIFIED 02/05/2008   Anxiety state 08/08/2007    Past Surgical History:  Procedure Laterality Date   BUBBLE STUDY  03/19/2021   Procedure: BUBBLE STUDY;  Surgeon: Wendall Stade, MD;  Location: [facility] ENDOSCOPY;  Service: Cardiovascular;;   CERVICAL DISCECTOMY  2004   dr Channing Mutters   KNEE SURGERY  1981   right   LOOP RECORDER INSERTION N/A 03/19/2021   Procedure: LOOP RECORDER INSERTION;  Surgeon: Lanier Prude, MD;  Location: [facility] INVASIVE CV LAB;  Service: Cardiovascular;  Laterality: N/A;   NECK SURGERY     REPLACEMENT TOTAL KNEE     right   REPLACEMENT TOTAL KNEE     TEE WITHOUT CARDIOVERSION N/A 03/19/2021   Procedure: TRANSESOPHAGEAL ECHOCARDIOGRAM (TEE);  Surgeon: Wendall Stade, MD;  Location: [facility] ENDOSCOPY;  Service: Cardiovascular;  Laterality: N/A;       Family History  Problem Relation Age of Onset   Heart attack Father 58       Sudden Cardiac Death   Liver cancer Mother 86    Social History   Tobacco Use   Smoking status: Some Days    Types: Cigars    Last attempt to quit: 01/04/2010    Years since quitting: 11.2   Smokeless tobacco: Never   Tobacco comments:    Smokes 1 cigar per day  Substance Use Topics   Alcohol use: No   Drug use: No    Home Medications Prior to Admission medications   Medication Sig Start Date End Date Taking? Authorizing Provider  acetaminophen (TYLENOL) 325 MG tablet Take 2 tablets (650 mg total) by  mouth every 4 (four) hours as needed for mild pain (or temp > 37.5 C (99.5 F)). 03/19/21   Metzger-Cihelka, Cristie Hem, NP  aspirin EC 81 MG EC tablet Take 1 tablet (81 mg total) by mouth daily. Swallow whole. 03/20/21   Metzger-Cihelka, Cristie Hem, NP  atorvastatin (LIPITOR) 80 MG tablet Take 1 tablet (80 mg total) by mouth daily. 03/31/21   Nafziger, Kandee Keen, NP  clopidogrel (PLAVIX) 75 MG tablet Take 1 tablet (75 mg total) by mouth daily. 03/20/21   Metzger-Cihelka, Cristie Hem, NP  FLUoxetine (PROZAC) 20 MG capsule TAKE 1 CAPSULE BY MOUTH DAILY Patient taking differently: Take 20 mg by mouth daily. 02/09/21   Nafziger, Kandee Keen, NP  zolpidem (AMBIEN) 5 MG tablet Take 1 tablet (5 mg total) by mouth at bedtime as needed for sleep.  04/07/21   Nafziger, Kandee Keen, NP  pravastatin (PRAVACHOL) 20 MG tablet Take 1 tablet (20 mg total) by mouth daily. Patient not taking: No sig reported 08/28/19 03/17/21  Shirline Frees, NP  traZODone (DESYREL) 100 MG tablet TAKE 1 TABLET (100 MG TOTAL) BY MOUTH AT BEDTIME AS NEEDED FOR SLEEP. Patient not taking: No sig reported 03/11/20 03/17/21  Shirline Frees, NP    Allergies    Tramadol and Trazodone and nefazodone  Review of Systems   Review of Systems  All other systems reviewed and are negative.  Physical Exam Updated Vital Signs BP 113/75   Pulse (!) 53   Temp 97.6 F (36.4 C) (Oral)   Resp 13   Ht 5\' 5"  (1.651 m)   Wt 115 kg   SpO2 95%   BMI 42.19 kg/m   Physical Exam Vitals and nursing note reviewed.  64 year old male, resting comfortably and in no acute distress. Vital signs are significant for slow heart rate. Oxygen saturation is 95%, which is normal. Head is normocephalic and atraumatic. PERRLA, EOMI. Oropharynx is clear.  There is tenderness to palpation of the insertion of paracervical muscles bilaterally.  There is no tenderness to palpation over the temporalis muscles or frontalis muscles. Neck is nontender and supple without adenopathy or JVD.  There are no carotid bruits. Back is nontender and there is no CVA tenderness. Lungs are clear without rales, wheezes, or rhonchi. Chest is nontender. Heart has regular rate and rhythm without murmur. Abdomen is soft, flat, nontender without masses or hepatosplenomegaly and peristalsis is normoactive. Extremities have no cyanosis or edema, full range of motion is present. Skin is warm and dry without rash. Neurologic: Awake and alert and oriented.  Speech is normal.  Cranial nerves are intact.  Strength is 5/5 in all 4 extremities.  There is no pronator drift.  Sensation is normal and there is no extinction on double simultaneous stimulation.  Station and gait are normal.  ED Results / Procedures / Treatments   Labs (all  labs ordered are listed, but only abnormal results are displayed) Labs Reviewed  COMPREHENSIVE METABOLIC PANEL - Abnormal; Notable for the following components:      Result Value   Glucose, Bld 112 (*)    Total Protein 6.4 (*)    All other components within normal limits  CBC WITH DIFFERENTIAL/PLATELET  TROPONIN I (HIGH SENSITIVITY)  TROPONIN I (HIGH SENSITIVITY)    EKG EKG Interpretation  Date/Time:  Monday April 13 2021 00:59:46 EDT Ventricular Rate:  69 PR Interval:  208 QRS Duration: 76 QT Interval:  360 QTC Calculation: 385 R Axis:   22 Text Interpretation: Normal sinus rhythm Possible Anterior infarct , age undetermined  Abnormal ECG When compared with ECG of 03/17/2021, No significant change was found Confirmed by Dione Booze ([id number]) on 04/13/2021 3:23:46 AM  Procedures Procedures   Medications Ordered in ED Medications  lactated ringers bolus 1,000 mL (0 mLs Intravenous Stopped 04/13/21 0522)  prochlorperazine (COMPAZINE) injection 10 mg (10 mg Intravenous Given 04/13/21 0420)  diphenhydrAMINE (BENADRYL) injection 25 mg (25 mg Intravenous Given 04/13/21 0422)    ED Course  I have reviewed the triage vital signs and the nursing notes.  Pertinent lab results that were available during my care of the patient were reviewed by me and considered in my medical decision making (see chart for details).   MDM Rules/Calculators/A&P                         Headache which has characteristics suggesting muscle contraction headache.  No red flags to suggest more serious pathology.  Neurologic exam is normal, no evidence of new stroke.  Old records reviewed confirming hospitalization on 7/12 for acute stroke treated with tPA.  No indication for CNS imaging today.  ECG is unchanged.  Labs have been ordered from triage.  He will be given a headache cocktail of lactated Ringer solution, prochlorperazine, diphenhydramine and reassessed.  5:29 AM Labs are reassuring.  He had excellent relief  of headache with above-noted treatment.  Currently waiting for delta troponin.  Repeat troponin is normal. He is discharged, advised to follow up with his PCP  Final Clinical Impression(s) / ED Diagnoses Final diagnoses:  Muscle contraction headache  Atypical chest pain    Rx / DC Orders ED Discharge Orders     None        Dione Booze, MD 04/13/21 2243

## 2021-04-13 NOTE — Discharge Instructions (Addendum)
If your headache comes back, you may take acetaminophen and/or ibuprofen as needed for it.  You may also try putting ice packs on your neck.  If you have any concerns, please feel free to return to the emergency department.  Please follow-up with your cardiologist regarding the loop recorder.

## 2021-04-13 NOTE — ED Provider Notes (Signed)
MSE was initiated and I personally evaluated the patient and placed orders (if any) at  1:14 AM on April 13, 2021.  Patient to ED with sensation of feeling flushed, like "hot all over", and head pressure that started tonight. He feels the symptoms woke him from sleep. Recent admission for embolic stroke 7/12, received TPA, d/ch 7/14 with Loop recorder, on Plavix. No lateralizing weakness, difficulty speaking or walking, numbness, visual change.   Today's Vitals   04/13/21 0103 04/13/21 0107  BP:  126/85  Pulse:  66  Resp:  18  Temp:  97.6 F (36.4 C)  TempSrc:  Oral  SpO2:  97%  Weight: 115 kg   Height: 5\' 5"  (1.651 m)   PainSc: 0-No pain    Body mass index is 42.19 kg/m.  No neurologic deficits RRR Lungs clear  Discussed with Dr. : for now no neuro imaging necessary.  The patient appears stable so that the remainder of the MSE may be completed by another provider.   Amada Jupiter, PA-C 04/13/21 0119    06/13/21, MD 04/13/21 [phone]

## 2021-04-13 NOTE — ED Triage Notes (Addendum)
Patient reports central chest tightness with mild SOB , lightheaded /dizzy onset this evening , No emesis or diaphoresis. CVA last 03/31/2021.

## 2021-04-15 NOTE — Telephone Encounter (Signed)
Spoke to pt and advised of message below. Pt verbalized understanding.

## 2021-04-15 NOTE — Telephone Encounter (Signed)
Called pt but wife answered. However, not able to speak with spouse as she is not on DPR. Called pt cell phone number with no answer. Rx was called in already. Closing note.

## 2021-04-15 NOTE — Telephone Encounter (Signed)
PT called to return the missed phone call in regards to the meds.

## 2021-05-04 ENCOUNTER — Ambulatory Visit (INDEPENDENT_AMBULATORY_CARE_PROVIDER_SITE_OTHER): Payer: 59

## 2021-05-04 DIAGNOSIS — I63412 Cerebral infarction due to embolism of left middle cerebral artery: Secondary | ICD-10-CM

## 2021-05-04 LAB — CUP PACEART REMOTE DEVICE CHECK
Date Time Interrogation Session: 20220829103812
Implantable Pulse Generator Implant Date: 20220714

## 2021-05-15 NOTE — Progress Notes (Signed)
Carelink Summary Report / Loop Recorder 

## 2021-06-08 ENCOUNTER — Ambulatory Visit (INDEPENDENT_AMBULATORY_CARE_PROVIDER_SITE_OTHER): Payer: 59

## 2021-06-08 DIAGNOSIS — I63412 Cerebral infarction due to embolism of left middle cerebral artery: Secondary | ICD-10-CM

## 2021-06-08 LAB — CUP PACEART REMOTE DEVICE CHECK
Date Time Interrogation Session: 20221001104326
Implantable Pulse Generator Implant Date: 20220714

## 2021-06-16 NOTE — Progress Notes (Signed)
Carelink Summary Report / Loop Recorder 

## 2021-06-26 ENCOUNTER — Other Ambulatory Visit: Payer: Self-pay

## 2021-06-26 NOTE — Patient Outreach (Signed)
[facility]) Care Management  06/26/2021  Neilson Oehlert Gauthier [birth year] [id number]   First telephone outreach attempt to obtain mRS. No answer. Left message for returned call.  Vanice Sarah [facility] Management Assistant [phone]

## 2021-06-29 ENCOUNTER — Other Ambulatory Visit: Payer: Self-pay

## 2021-06-29 NOTE — Patient Outreach (Signed)
[facility]) Care Management  06/29/2021  Hewitt Garner [facility] September 20, 1955 [id number]   Second telephone outreach attempt to obtain mRS. No answer. Left message for returned call.  Vanice Sarah [facility] Management Assistant [phone]

## 2021-07-02 ENCOUNTER — Other Ambulatory Visit: Payer: Self-pay

## 2021-07-02 NOTE — Patient Outreach (Signed)
[facility]) Care Management  07/02/2021  Cristian Gonzales 1956/08/25 [id number]   3 outreach attempts were completed to obtain mRs. mRs could not be obtained because patient never returned my calls. mRs=7    Vanice Sarah Care Management Assistant [phone]

## 2021-07-09 LAB — CUP PACEART REMOTE DEVICE CHECK
Date Time Interrogation Session: 20221103104328
Implantable Pulse Generator Implant Date: 20220714

## 2021-07-13 ENCOUNTER — Ambulatory Visit (INDEPENDENT_AMBULATORY_CARE_PROVIDER_SITE_OTHER): Payer: 59

## 2021-07-13 DIAGNOSIS — I63412 Cerebral infarction due to embolism of left middle cerebral artery: Secondary | ICD-10-CM

## 2021-07-20 NOTE — Progress Notes (Signed)
Carelink Summary Report / Loop Recorder 

## 2021-08-17 ENCOUNTER — Ambulatory Visit (INDEPENDENT_AMBULATORY_CARE_PROVIDER_SITE_OTHER): Payer: 59

## 2021-08-17 DIAGNOSIS — I63412 Cerebral infarction due to embolism of left middle cerebral artery: Secondary | ICD-10-CM

## 2021-08-18 LAB — CUP PACEART REMOTE DEVICE CHECK
Date Time Interrogation Session: 20221206104055
Implantable Pulse Generator Implant Date: 20220714

## 2021-08-19 ENCOUNTER — Ambulatory Visit (INDEPENDENT_AMBULATORY_CARE_PROVIDER_SITE_OTHER): Payer: 59 | Admitting: Adult Health

## 2021-08-19 ENCOUNTER — Encounter: Payer: Self-pay | Admitting: Adult Health

## 2021-08-19 VITALS — BP 120/80 | HR 79 | Temp 97.5°F | Ht 65.0 in | Wt 246.0 lb

## 2021-08-19 DIAGNOSIS — M2550 Pain in unspecified joint: Secondary | ICD-10-CM | POA: Diagnosis not present

## 2021-08-19 DIAGNOSIS — J069 Acute upper respiratory infection, unspecified: Secondary | ICD-10-CM

## 2021-08-19 DIAGNOSIS — G8929 Other chronic pain: Secondary | ICD-10-CM

## 2021-08-19 DIAGNOSIS — F419 Anxiety disorder, unspecified: Secondary | ICD-10-CM

## 2021-08-19 MED ORDER — LORAZEPAM 0.5 MG PO TABS: 0.5000 mg | ORAL_TABLET | Freq: Two times a day (BID) | ORAL | 1 refills | Status: DC | PRN

## 2021-08-19 NOTE — Progress Notes (Signed)
Subjective:    Patient ID: Cristian Gonzales, male    DOB: 03-28-1956, 65 y.o.   MRN: [id number]  HPI 65 year old male who  has a past medical history of Anxiety, Arthritis, Cervical spine fracture (HCC), Depression, Stroke (HCC), and Syncope (11/27/2012).  He was recently seen ( 2 days ago) at urgent care and diagnosed with bronchitis.  He presented with a cough and myalgias for 3 weeks.  He also endorsed facial flushing specifically on both ears and increased joint pain.    He was started on a Z pack and tessalon pearls.  And was advised to stop his Prozac as the manufactures had changed and there was concern that some of his symptoms were coming from an allergic reaction to the medication.  It was noted that his BP was elevated at 162/94. He does not have a history of HTN and blood pressure readings in the past have been within normal limits.    Today he reports that he has started antibiotics and is taking the [employer], he may notice a small improvement in his cough.  Still has 3 days of antibiotic therapy.  He continues to have chronic joint pain especially in his shoulders and knees, this has not improved since stopping his Prozac.  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  This plan will be closing in the next 2 weeks and he is supposed to retire in the next 2 months.   Review of Systems See HPI   Past Medical History:  Diagnosis Date   Anxiety    Arthritis    Cervical spine fracture (HCC)    13 -diving accident, no neurolgic sequelae   Depression    Stroke (HCC)    Syncope 11/27/2012   From Tramadol    Social History   Socioeconomic History   Marital status: Married    Spouse name: Not on file   Number of children: Not on file   Years of education: Not on file   Highest education level: Not on file  Occupational History   Not on file  Tobacco Use   Smoking status: Some Days    Types: Cigars     Last attempt to quit: 01/04/2010    Years since quitting: 11.6   Smokeless tobacco: Never   Tobacco comments:    Smokes 1 cigar per day  Substance and Sexual Activity   Alcohol use: No   Drug use: No   Sexual activity: Not on file  Other Topics Concern   Not on file  Social History Narrative   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    Married for 25 years    5 children ( 3 in [place] and 2 in [place])   Social Determinants of Health   Financial Resource Strain: Not on file  Food Insecurity: Not on file  Transportation Needs: Not on file  Physical Activity: Not on file  Stress: Not on file  Social Connections: Not on file  Intimate Partner Violence: Not on file    Past Surgical History:  Procedure Laterality Date   BUBBLE STUDY  03/19/2021   Procedure: BUBBLE STUDY;  Surgeon: Wendall Stade, MD;  Location: [facility] ENDOSCOPY;  Service: Cardiovascular;;   CERVICAL DISCECTOMY  2004   dr Channing Mutters   KNEE SURGERY  1981   right   LOOP RECORDER INSERTION N/A 03/19/2021   Procedure: LOOP RECORDER  INSERTION;  Surgeon: Lanier Prude, MD;  Location: [facility] INVASIVE CV LAB;  Service: Cardiovascular;  Laterality: N/A;   NECK SURGERY     REPLACEMENT TOTAL KNEE     right   REPLACEMENT TOTAL KNEE     TEE WITHOUT CARDIOVERSION N/A 03/19/2021   Procedure: TRANSESOPHAGEAL ECHOCARDIOGRAM (TEE);  Surgeon: Wendall Stade, MD;  Location: [facility] ENDOSCOPY;  Service: Cardiovascular;  Laterality: N/A;    Family History  Problem Relation Age of Onset   Heart attack Father 74       Sudden Cardiac Death   Liver cancer Mother 32    Allergies  Allergen Reactions   Tramadol     Pass Out   Trazodone And Nefazodone Other (See Comments)    Flushness, blurred vision, muscle cramps     Current Outpatient Medications on File Prior to Visit  Medication Sig Dispense Refill   acetaminophen (TYLENOL) 325 MG tablet Take 2 tablets (650 mg total) by mouth every 4 (four) hours as needed for mild pain (or  temp > 37.5 C (99.5 F)).     aspirin EC 81 MG EC tablet Take 1 tablet (81 mg total) by mouth daily. Swallow whole. 30 tablet 11   atorvastatin (LIPITOR) 80 MG tablet Take 1 tablet (80 mg total) by mouth daily. 90 tablet 3   zolpidem (AMBIEN) 5 MG tablet Take 1 tablet (5 mg total) by mouth at bedtime as needed for sleep. 15 tablet 1   FLUoxetine (PROZAC) 20 MG capsule TAKE 1 CAPSULE BY MOUTH DAILY (Patient not taking: Reported on 08/19/2021) 90 capsule 1   [DISCONTINUED] pravastatin (PRAVACHOL) 20 MG tablet Take 1 tablet (20 mg total) by mouth daily. (Patient not taking: No sig reported) 90 tablet 3   [DISCONTINUED] traZODone (DESYREL) 100 MG tablet TAKE 1 TABLET (100 MG TOTAL) BY MOUTH AT BEDTIME AS NEEDED FOR SLEEP. (Patient not taking: No sig reported) 90 tablet 1   No current facility-administered medications on file prior to visit.    BP 120/80    Pulse 79    Temp (!) 97.5 F (36.4 C) (Oral)    Ht 5\' 5"  (1.651 m)    Wt 246 lb (111.6 kg)    SpO2 94%    BMI 40.94 kg/m       Objective:   Physical Exam Vitals and nursing note reviewed.  Constitutional:      Appearance: Normal appearance.  HENT:     Right Ear: Tympanic membrane, ear canal and external ear normal. There is no impacted cerumen.     Left Ear: Tympanic membrane, ear canal and external ear normal. There is no impacted cerumen.  Cardiovascular:     Rate and Rhythm: Normal rate and regular rhythm.     Pulses: Normal pulses.     Heart sounds: Normal heart sounds.  Pulmonary:     Effort: Pulmonary effort is normal.     Breath sounds: Normal breath sounds.  Abdominal:     General: Abdomen is flat.     Palpations: Abdomen is soft.  Musculoskeletal:        General: No swelling or tenderness.     Right lower leg: No edema.     Left lower leg: No edema.  Skin:    General: Skin is warm and dry.     Capillary Refill: Capillary refill takes less than 2 seconds.     Findings: No erythema or rash.  Neurological:     General:  No focal deficit present.  Mental Status: He is alert and oriented to person, place, and time.  Psychiatric:        Mood and Affect: Mood normal.        Behavior: Behavior normal.        Thought Content: Thought content normal.       Assessment & Plan:  1. Viral upper respiratory tract infection with cough -Possible bronchitis or other viral upper respiratory tract infection.  Advised to finish antibiotics and continue Tessalon Perles.  Follow-up as needed.  2. Anxiety -Placed back on Prozac.  We will also send in short course of Ativan to help with his increased anxiety from life stressor. - LORazepam (ATIVAN) 0.5 MG tablet; Take 1 tablet (0.5 mg total) by mouth 2 (two) times daily as needed for anxiety.  Dispense: 30 tablet; Refill: 1  3. Chronic joint pain -Worsening osteoarthritis.  He can take anti-inflammatory as needed.  I do not think his symptoms are related to medication reaction.  Shirline Frees, NP

## 2021-08-27 NOTE — Progress Notes (Signed)
Carelink Summary Report / Loop Recorder 

## 2021-09-10 ENCOUNTER — Encounter: Payer: 59 | Admitting: Adult Health
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
## 2021-09-21 ENCOUNTER — Ambulatory Visit (INDEPENDENT_AMBULATORY_CARE_PROVIDER_SITE_OTHER): Payer: 59

## 2021-09-21 DIAGNOSIS — I63412 Cerebral infarction due to embolism of left middle cerebral artery: Secondary | ICD-10-CM

## 2021-09-22 LAB — CUP PACEART REMOTE DEVICE CHECK
Date Time Interrogation Session: 20230115231243
Implantable Pulse Generator Implant Date: 20220714

## 2021-10-01 NOTE — Progress Notes (Signed)
Carelink Summary Report / Loop Recorder 

## 2021-10-22 ENCOUNTER — Other Ambulatory Visit: Payer: Self-pay | Admitting: Adult Health

## 2021-10-22 DIAGNOSIS — F39 Unspecified mood [affective] disorder: Secondary | ICD-10-CM

## 2021-10-24 LAB — CUP PACEART REMOTE DEVICE CHECK
Date Time Interrogation Session: 20230217230317
Implantable Pulse Generator Implant Date: 20220714

## 2021-10-26 ENCOUNTER — Ambulatory Visit (INDEPENDENT_AMBULATORY_CARE_PROVIDER_SITE_OTHER): Payer: 59

## 2021-10-26 DIAGNOSIS — I63412 Cerebral infarction due to embolism of left middle cerebral artery: Secondary | ICD-10-CM

## 2021-10-30 NOTE — Progress Notes (Signed)
Carelink Summary Report / Loop Recorder 

## 2021-11-30 ENCOUNTER — Ambulatory Visit (INDEPENDENT_AMBULATORY_CARE_PROVIDER_SITE_OTHER): Payer: 59

## 2021-11-30 DIAGNOSIS — I63412 Cerebral infarction due to embolism of left middle cerebral artery: Secondary | ICD-10-CM | POA: Diagnosis not present

## 2021-12-01 LAB — CUP PACEART REMOTE DEVICE CHECK
Date Time Interrogation Session: 20230326231245
Implantable Pulse Generator Implant Date: 20220714

## 2021-12-10 NOTE — Progress Notes (Signed)
Carelink Summary Report / Loop Recorder 

## 2022-01-04 ENCOUNTER — Ambulatory Visit (INDEPENDENT_AMBULATORY_CARE_PROVIDER_SITE_OTHER): Payer: Medicare PPO

## 2022-01-04 DIAGNOSIS — I63412 Cerebral infarction due to embolism of left middle cerebral artery: Secondary | ICD-10-CM | POA: Diagnosis not present

## 2022-01-04 LAB — CUP PACEART REMOTE DEVICE CHECK
Date Time Interrogation Session: 20230430231218
Implantable Pulse Generator Implant Date: 20220714

## 2022-01-06 ENCOUNTER — Ambulatory Visit (INDEPENDENT_AMBULATORY_CARE_PROVIDER_SITE_OTHER): Payer: Medicare PPO

## 2022-01-06 ENCOUNTER — Encounter: Payer: Self-pay | Admitting: Adult Health

## 2022-01-06 ENCOUNTER — Ambulatory Visit (INDEPENDENT_AMBULATORY_CARE_PROVIDER_SITE_OTHER): Payer: Medicare PPO | Admitting: Adult Health

## 2022-01-06 VITALS — BP 118/72 | HR 70 | Temp 98.8°F | Ht 64.0 in | Wt 250.0 lb

## 2022-01-06 DIAGNOSIS — F419 Anxiety disorder, unspecified: Secondary | ICD-10-CM | POA: Diagnosis not present

## 2022-01-06 DIAGNOSIS — Z125 Encounter for screening for malignant neoplasm of prostate: Secondary | ICD-10-CM | POA: Diagnosis not present

## 2022-01-06 DIAGNOSIS — R0789 Other chest pain: Secondary | ICD-10-CM | POA: Diagnosis not present

## 2022-01-06 DIAGNOSIS — E782 Mixed hyperlipidemia: Secondary | ICD-10-CM

## 2022-01-06 DIAGNOSIS — Z0000 Encounter for general adult medical examination without abnormal findings: Secondary | ICD-10-CM

## 2022-01-06 DIAGNOSIS — Z720 Tobacco use: Secondary | ICD-10-CM

## 2022-01-06 DIAGNOSIS — F32A Depression, unspecified: Secondary | ICD-10-CM

## 2022-01-06 DIAGNOSIS — I63412 Cerebral infarction due to embolism of left middle cerebral artery: Secondary | ICD-10-CM | POA: Diagnosis not present

## 2022-01-06 LAB — COMPREHENSIVE METABOLIC PANEL
ALT: 33 U/L (ref 0–53)
AST: 25 U/L (ref 0–37)
Albumin: 4.7 g/dL (ref 3.5–5.2)
Alkaline Phosphatase: 93 U/L (ref 39–117)
BUN: 18 mg/dL (ref 6–23)
CO2: 29 mEq/L (ref 19–32)
Calcium: 9.3 mg/dL (ref 8.4–10.5)
Chloride: 105 mEq/L (ref 96–112)
Creatinine, Ser: 1.03 mg/dL (ref 0.40–1.50)
GFR: 75.86 mL/min (ref >60.00)
Glucose, Bld: 80 mg/dL (ref 70–99)
Potassium: 4.8 mEq/L (ref 3.5–5.1)
Sodium: 142 mEq/L (ref 135–145)
Total Bilirubin: 0.5 mg/dL (ref 0.2–1.2)
Total Protein: 7 g/dL (ref 6.0–8.3)

## 2022-01-06 LAB — CBC WITH DIFFERENTIAL/PLATELET
Basophils Absolute: 0.1 10*3/uL (ref 0.0–0.1)
Basophils Relative: 0.9 % (ref 0.0–3.0)
Eosinophils Absolute: 0.4 10*3/uL (ref 0.0–0.7)
Eosinophils Relative: 3.9 % (ref 0.0–5.0)
HCT: 45.5 % (ref 39.0–52.0)
Hemoglobin: 14.9 g/dL (ref 13.0–17.0)
Lymphocytes Relative: 23.1 % (ref 12.0–46.0)
Lymphs Abs: 2.1 10*3/uL (ref 0.7–4.0)
MCHC: 32.7 g/dL (ref 30.0–36.0)
MCV: 90.4 fl (ref 78.0–100.0)
Monocytes Absolute: 0.7 10*3/uL (ref 0.1–1.0)
Monocytes Relative: 7.4 % (ref 3.0–12.0)
Neutro Abs: 6 10*3/uL (ref 1.4–7.7)
Neutrophils Relative %: 64.7 % (ref 43.0–77.0)
Platelets: 287 10*3/uL (ref 150.0–400.0)
RBC: 5.03 Mil/uL (ref 4.22–5.81)
RDW: 14.2 % (ref 11.5–15.5)
WBC: 9.3 10*3/uL (ref 4.0–10.5)

## 2022-01-06 LAB — LIPID PANEL
Cholesterol: 154 mg/dL (ref 0–200)
HDL: 49.8 mg/dL (ref >39.00)
LDL Cholesterol: 86 mg/dL (ref 0–99)
NonHDL: 104.16
Total CHOL/HDL Ratio: 3
Triglycerides: 92 mg/dL (ref 0.0–149.0)
VLDL: 18.4 mg/dL (ref 0.0–40.0)

## 2022-01-06 LAB — HEMOGLOBIN A1C: Hgb A1c MFr Bld: 5.8 % (ref 4.6–6.5)

## 2022-01-06 LAB — TSH: TSH: 1.39 u[IU]/mL (ref 0.35–5.50)

## 2022-01-06 LAB — PSA: PSA: 4.74 ng/mL — ABNORMAL HIGH (ref 0.10–4.00)

## 2022-01-06 MED ORDER — ALBUTEROL SULFATE HFA 108 (90 BASE) MCG/ACT IN AERS: 2.0000 | INHALATION_SPRAY | Freq: Four times a day (QID) | RESPIRATORY_TRACT | 0 refills | Status: DC | PRN
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
## 2022-01-07 ENCOUNTER — Telehealth: Payer: Self-pay | Admitting: Adult Health

## 2022-01-07 ENCOUNTER — Other Ambulatory Visit: Payer: Self-pay | Admitting: Adult Health

## 2022-01-07 DIAGNOSIS — R972 Elevated prostate specific antigen [PSA]: Secondary | ICD-10-CM

## 2022-01-07 NOTE — Telephone Encounter (Signed)
Called pt 3 times prior to note no anser. LM with spouse and on pt vm number provided by spouse [phone]. ?

## 2022-01-07 NOTE — Telephone Encounter (Signed)
Patient called in need advice as to what to do now that he has had his physical. He also wants to know his results. Patient request for a call back on his mobil [phone] ?

## 2022-01-18 NOTE — Progress Notes (Signed)
Carelink Summary Report / Loop Recorder 

## 2022-01-29 ENCOUNTER — Other Ambulatory Visit: Payer: Self-pay | Admitting: Adult Health

## 2022-01-29 DIAGNOSIS — R0789 Other chest pain: Secondary | ICD-10-CM

## 2022-02-08 ENCOUNTER — Ambulatory Visit (INDEPENDENT_AMBULATORY_CARE_PROVIDER_SITE_OTHER): Payer: Medicare PPO

## 2022-02-08 DIAGNOSIS — I63412 Cerebral infarction due to embolism of left middle cerebral artery: Secondary | ICD-10-CM | POA: Diagnosis not present

## 2022-02-09 LAB — CUP PACEART REMOTE DEVICE CHECK
Date Time Interrogation Session: 20230602230053
Implantable Pulse Generator Implant Date: 20220714

## 2022-02-23 NOTE — Progress Notes (Signed)
Carelink Summary Report / Loop Recorder 

## 2022-02-25 ENCOUNTER — Other Ambulatory Visit: Payer: Self-pay | Admitting: Adult Health

## 2022-02-25 DIAGNOSIS — R0789 Other chest pain: Secondary | ICD-10-CM

## 2022-03-15 ENCOUNTER — Ambulatory Visit (INDEPENDENT_AMBULATORY_CARE_PROVIDER_SITE_OTHER): Payer: Medicare PPO

## 2022-03-15 DIAGNOSIS — I63412 Cerebral infarction due to embolism of left middle cerebral artery: Secondary | ICD-10-CM | POA: Diagnosis not present

## 2022-03-16 LAB — CUP PACEART REMOTE DEVICE CHECK
Date Time Interrogation Session: 20230705230914
Implantable Pulse Generator Implant Date: 20220714

## 2022-03-17 ENCOUNTER — Telehealth: Payer: Self-pay | Admitting: Adult Health

## 2022-03-17 NOTE — Telephone Encounter (Signed)
Please advise 

## 2022-03-17 NOTE — Telephone Encounter (Signed)
Noted  

## 2022-03-17 NOTE — Telephone Encounter (Signed)
Pt scheduled to come in on 03/18/22 @ 3:30pm

## 2022-03-17 NOTE — Telephone Encounter (Signed)
Pt called to say he has very bad poison ivy and is in dire need of some medication. Pt was offered an OV or Video visit and stated he was sure NP would not deem it necessary.  Please send to: [id number] - , [place] - [address] AT [facility] [address] Phone:  [phone]  Fax:  [phone]

## 2022-03-18 ENCOUNTER — Encounter: Payer: Self-pay | Admitting: Adult Health

## 2022-03-18 ENCOUNTER — Ambulatory Visit (INDEPENDENT_AMBULATORY_CARE_PROVIDER_SITE_OTHER): Payer: Medicare PPO | Admitting: Adult Health

## 2022-03-18 VITALS — BP 138/86 | HR 84 | Temp 99.1°F | Ht 64.0 in | Wt 253.0 lb

## 2022-03-18 DIAGNOSIS — L03113 Cellulitis of right upper limb: Secondary | ICD-10-CM | POA: Diagnosis not present

## 2022-03-18 DIAGNOSIS — L237 Allergic contact dermatitis due to plants, except food: Secondary | ICD-10-CM

## 2022-03-18 MED ORDER — METHYLPREDNISOLONE ACETATE 80 MG/ML IJ SUSP
80.0000 mg | Freq: Once | INTRAMUSCULAR | Status: AC
  Administered 2022-03-18: 80 mg via INTRAMUSCULAR

## 2022-03-18 MED ORDER — METHYLPREDNISOLONE ACETATE 40 MG/ML IJ SUSP
40.0000 mg | Freq: Once | INTRAMUSCULAR | Status: AC
  Administered 2022-03-18: 40 mg via INTRAMUSCULAR

## 2022-03-18 MED ORDER — PREDNISONE 10 MG PO TABS: 10.0000 mg | ORAL_TABLET | Freq: Every day | ORAL | 0 refills | Status: DC

## 2022-03-18 MED ORDER — HYDROXYZINE HCL 50 MG PO TABS: 50.0000 mg | ORAL_TABLET | Freq: Three times a day (TID) | ORAL | 0 refills | Status: DC | PRN

## 2022-03-18 MED ORDER — DOXYCYCLINE HYCLATE 100 MG PO CAPS: 100.0000 mg | ORAL_CAPSULE | Freq: Two times a day (BID) | ORAL | 0 refills | Status: DC

## 2022-03-18 NOTE — Patient Instructions (Addendum)
It was great seeing you today   We gave you a steroid injection in the office today   I have also sent in a 10 days course of prednisone - start this tomorrow   I have sent in a medication called Atarax to help with itching but this can make you sleepy   It also looks like you have a secondary infection from the poison ivy and I have sent in Doxycycline as the antibiotic to treat this   Please follow up if not resolved in the next 5 days
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
## 2022-04-07 NOTE — Progress Notes (Signed)
Carelink Summary Report / Loop Recorder 

## 2022-04-15 LAB — CUP PACEART REMOTE DEVICE CHECK
Date Time Interrogation Session: 20230807231012
Implantable Pulse Generator Implant Date: 20220714

## 2022-05-15 ENCOUNTER — Other Ambulatory Visit: Payer: Self-pay | Admitting: Adult Health

## 2022-05-15 DIAGNOSIS — I63412 Cerebral infarction due to embolism of left middle cerebral artery: Secondary | ICD-10-CM

## 2022-05-17 ENCOUNTER — Telehealth: Payer: Self-pay | Admitting: Adult Health

## 2022-05-17 NOTE — Telephone Encounter (Signed)
Pt was sent to triage nurse for dizziness and ha. Pt was triaged by nick and was told to be seen with 24 hrs or go to UC. Pt decline to go to UC and has an appt with dr fry tomorrow at 345 pm. Please advise

## 2022-05-17 NOTE — Telephone Encounter (Signed)
Noted  

## 2022-05-18 ENCOUNTER — Ambulatory Visit: Payer: Medicare PPO | Admitting: Family Medicine

## 2022-05-24 ENCOUNTER — Ambulatory Visit (INDEPENDENT_AMBULATORY_CARE_PROVIDER_SITE_OTHER): Payer: Medicare PPO

## 2022-05-24 DIAGNOSIS — I63412 Cerebral infarction due to embolism of left middle cerebral artery: Secondary | ICD-10-CM

## 2022-05-25 LAB — CUP PACEART REMOTE DEVICE CHECK
Date Time Interrogation Session: 20230917231501
Implantable Pulse Generator Implant Date: 20220714

## 2022-06-08 NOTE — Progress Notes (Signed)
Carelink Summary Report / Loop Recorder 

## 2022-07-27 ENCOUNTER — Encounter: Payer: Self-pay | Admitting: Adult Health

## 2022-07-27 ENCOUNTER — Ambulatory Visit (INDEPENDENT_AMBULATORY_CARE_PROVIDER_SITE_OTHER): Payer: Medicare PPO | Admitting: Adult Health

## 2022-07-27 ENCOUNTER — Ambulatory Visit (INDEPENDENT_AMBULATORY_CARE_PROVIDER_SITE_OTHER): Payer: Medicare PPO

## 2022-07-27 VITALS — BP 128/82 | HR 85 | Temp 98.2°F | Ht 64.0 in | Wt 253.0 lb

## 2022-07-27 DIAGNOSIS — R051 Acute cough: Secondary | ICD-10-CM

## 2022-07-27 MED ORDER — HYDROCODONE BIT-HOMATROP MBR 5-1.5 MG/5ML PO SOLN: 5.0000 mL | Freq: Three times a day (TID) | ORAL | 0 refills | Status: DC | PRN
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
## 2022-07-27 NOTE — Addendum Note (Signed)
Addended by: Geralyn Flash D on: 07/27/2022 03:50 PM   Modules accepted: Level of Service

## 2022-07-28 ENCOUNTER — Other Ambulatory Visit: Payer: Self-pay | Admitting: Adult Health

## 2022-07-28 DIAGNOSIS — R051 Acute cough: Secondary | ICD-10-CM

## 2022-07-28 MED ORDER — DOXYCYCLINE HYCLATE 100 MG PO CAPS: 100.0000 mg | ORAL_CAPSULE | Freq: Two times a day (BID) | ORAL | 0 refills | Status: DC

## 2022-07-28 MED ORDER — PREDNISONE 10 MG PO TABS: ORAL_TABLET | ORAL | 0 refills | Status: DC

## 2022-08-03 ENCOUNTER — Telehealth: Payer: Self-pay | Admitting: Adult Health

## 2022-08-03 NOTE — Telephone Encounter (Signed)
Pt is calling and would like chest xray result 

## 2022-09-07 ENCOUNTER — Telehealth: Payer: Self-pay | Admitting: Adult Health

## 2022-09-07 NOTE — Telephone Encounter (Signed)
I spoke with patient to schedule his AWV.  Appt scheduled 09/21/22 he wanted to know if he could get  pneumonia and flu vaccine at this appt

## 2022-09-21 ENCOUNTER — Ambulatory Visit (INDEPENDENT_AMBULATORY_CARE_PROVIDER_SITE_OTHER): Payer: Medicare PPO

## 2022-09-21 VITALS — BP 122/62 | HR 73 | Temp 99.1°F | Ht 64.0 in | Wt 255.1 lb

## 2022-09-21 DIAGNOSIS — Z23 Encounter for immunization: Secondary | ICD-10-CM | POA: Diagnosis not present

## 2022-09-21 DIAGNOSIS — Z0000 Encounter for general adult medical examination without abnormal findings: Secondary | ICD-10-CM

## 2022-09-21 NOTE — Patient Instructions (Addendum)
Cristian Gonzales , Thank you for taking time to come for your Medicare Wellness Visit. I appreciate your ongoing commitment to your health goals. Please review the following plan we discussed and let me know if I can assist you in the future.   These are the goals we discussed:  Goals       No current goals (pt-stated)        This is a list of the screening recommended for you and due dates:  Health Maintenance  Topic Date Due   COVID-19 Vaccine (3 - 2023-24 season) 10/07/2022*   Zoster (Shingles) Vaccine (1 of 2) 12/21/2022*   Pneumonia Vaccine (1 - PCV) 09/22/2023*   Cologuard (Stool DNA test)  10/04/2022   Medicare Annual Wellness Visit  09/22/2023   DTaP/Tdap/Td vaccine (3 - Td or Tdap) 06/16/2025   Flu Shot  Completed   Hepatitis C Screening: USPSTF Recommendation to screen - Ages 18-79 yo.  Completed   HPV Vaccine  Aged Out  *Topic was postponed. The date shown is not the original due date.    Advanced directives: Advance directive discussed with you today. Even though you declined this today, please call our office should you change your mind, and we can give you the proper paperwork for you to fill out.   Conditions/risks identified: None  Next appointment: Follow up in one year for your annual wellness visit.    Preventive Care 17 Years and Older, Male  Preventive care refers to lifestyle choices and visits with your health care provider that can promote health and wellness. What does preventive care include? A yearly physical exam. This is also called an annual well check. Dental exams once or twice a year. Routine eye exams. Ask your health care provider how often you should have your eyes checked. Personal lifestyle choices, including: Daily care of your teeth and gums. Regular physical activity. Eating a healthy diet. Avoiding tobacco and drug use. Limiting alcohol use. Practicing safe sex. Taking low doses of aspirin every day. Taking vitamin and mineral  supplements as recommended by your health care provider. What happens during an annual well check? The services and screenings done by your health care provider during your annual well check will depend on your age, overall health, lifestyle risk factors, and family history of disease. Counseling  Your health care provider may ask you questions about your: Alcohol use. Tobacco use. Drug use. Emotional well-being. Home and relationship well-being. Sexual activity. Eating habits. History of falls. Memory and ability to understand (cognition). Work and work Statistician. Screening  You may have the following tests or measurements: Height, weight, and BMI. Blood pressure. Lipid and cholesterol levels. These may be checked every 5 years, or more frequently if you are over 40 years old. Skin check. Lung cancer screening. You may have this screening every year starting at age 59 if you have a 30-pack-year history of smoking and currently smoke or have quit within the past 15 years. Fecal occult blood test (FOBT) of the stool. You may have this test every year starting at age 50. Flexible sigmoidoscopy or colonoscopy. You may have a sigmoidoscopy every 5 years or a colonoscopy every 10 years starting at age 66. Prostate cancer screening. Recommendations will vary depending on your family history and other risks. Hepatitis C blood test. Hepatitis B blood test. Sexually transmitted disease (STD) testing. Diabetes screening. This is done by checking your blood sugar (glucose) after you have not eaten for a while (fasting). You may have  this done every 1-3 years. Abdominal aortic aneurysm (AAA) screening. You may need this if you are a current or former smoker. Osteoporosis. You may be screened starting at age 65 if you are at high risk. Talk with your health care provider about your test results, treatment options, and if necessary, the need for more tests. Vaccines  Your health care provider  may recommend certain vaccines, such as: Influenza vaccine. This is recommended every year. Tetanus, diphtheria, and acellular pertussis (Tdap, Td) vaccine. You may need a Td booster every 10 years. Zoster vaccine. You may need this after age 2. Pneumococcal 13-valent conjugate (PCV13) vaccine. One dose is recommended after age 83. Pneumococcal polysaccharide (PPSV23) vaccine. One dose is recommended after age 63. Talk to your health care provider about which screenings and vaccines you need and how often you need them. This information is not intended to replace advice given to you by your health care provider. Make sure you discuss any questions you have with your health care provider. Document Released: 09/19/2015 Document Revised: 05/12/2016 Document Reviewed: 06/24/2015 Elsevier Interactive Patient Education  2017 [place] Prevention in the Home Falls can cause injuries. They can happen to people of all ages. There are many things you can do to make your home safe and to help prevent falls. What can I do on the outside of my home? Regularly fix the edges of walkways and driveways and fix any cracks. Remove anything that might make you trip as you walk through a door, such as a raised step or threshold. Trim any bushes or trees on the path to your home. Use bright outdoor lighting. Clear any walking paths of anything that might make someone trip, such as rocks or tools. Regularly check to see if handrails are loose or broken. Make sure that both sides of any steps have handrails. Any raised decks and porches should have guardrails on the edges. Have any leaves, snow, or ice cleared regularly. Use sand or salt on walking paths during winter. Clean up any spills in your garage right away. This includes oil or grease spills. What can I do in the bathroom? Use night lights. Install grab bars by the toilet and in the tub and shower. Do not use towel bars as grab bars. Use  non-skid mats or decals in the tub or shower. If you need to sit down in the shower, use a plastic, non-slip stool. Keep the floor dry. Clean up any water that spills on the floor as soon as it happens. Remove soap buildup in the tub or shower regularly. Attach bath mats securely with double-sided non-slip rug tape. Do not have throw rugs and other things on the floor that can make you trip. What can I do in the bedroom? Use night lights. Make sure that you have a light by your bed that is easy to reach. Do not use any sheets or blankets that are too big for your bed. They should not hang down onto the floor. Have a firm chair that has side arms. You can use this for support while you get dressed. Do not have throw rugs and other things on the floor that can make you trip. What can I do in the kitchen? Clean up any spills right away. Avoid walking on wet floors. Keep items that you use a lot in easy-to-reach places. If you need to reach something above you, use a strong step stool that has a grab bar. Keep electrical cords out  of the way. Do not use floor polish or wax that makes floors slippery. If you must use wax, use non-skid floor wax. Do not have throw rugs and other things on the floor that can make you trip. What can I do with my stairs? Do not leave any items on the stairs. Make sure that there are handrails on both sides of the stairs and use them. Fix handrails that are broken or loose. Make sure that handrails are as long as the stairways. Check any carpeting to make sure that it is firmly attached to the stairs. Fix any carpet that is loose or worn. Avoid having throw rugs at the top or bottom of the stairs. If you do have throw rugs, attach them to the floor with carpet tape. Make sure that you have a light switch at the top of the stairs and the bottom of the stairs. If you do not have them, ask someone to add them for you. What else can I do to help prevent falls? Wear  shoes that: Do not have high heels. Have rubber bottoms. Are comfortable and fit you well. Are closed at the toe. Do not wear sandals. If you use a stepladder: Make sure that it is fully opened. Do not climb a closed stepladder. Make sure that both sides of the stepladder are locked into place. Ask someone to hold it for you, if possible. Clearly mark and make sure that you can see: Any grab bars or handrails. First and last steps. Where the edge of each step is. Use tools that help you move around (mobility aids) if they are needed. These include: Canes. Walkers. Scooters. Crutches. Turn on the lights when you go into a dark area. Replace any light bulbs as soon as they burn out. Set up your furniture so you have a clear path. Avoid moving your furniture around. If any of your floors are uneven, fix them. If there are any pets around you, be aware of where they are. Review your medicines with your doctor. Some medicines can make you feel dizzy. This can increase your chance of falling. Ask your doctor what other things that you can do to help prevent falls. This information is not intended to replace advice given to you by your health care provider. Make sure you discuss any questions you have with your health care provider. Document Released: 06/19/2009 Document Revised: 01/29/2016 Document Reviewed: 09/27/2014 Elsevier Interactive Patient Education  2017 [employer].
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
## 2022-10-11 ENCOUNTER — Telehealth (INDEPENDENT_AMBULATORY_CARE_PROVIDER_SITE_OTHER): Payer: Medicare PPO | Admitting: Family Medicine

## 2022-10-11 ENCOUNTER — Ambulatory Visit: Payer: Medicare PPO | Admitting: Family Medicine

## 2022-10-11 ENCOUNTER — Ambulatory Visit: Payer: Medicare PPO

## 2022-10-11 ENCOUNTER — Encounter: Payer: Self-pay | Admitting: Family Medicine

## 2022-10-11 VITALS — Ht 64.0 in | Wt 255.0 lb

## 2022-10-11 DIAGNOSIS — U071 COVID-19: Secondary | ICD-10-CM | POA: Diagnosis not present

## 2022-10-11 DIAGNOSIS — J029 Acute pharyngitis, unspecified: Secondary | ICD-10-CM | POA: Diagnosis not present

## 2022-10-11 DIAGNOSIS — R0981 Nasal congestion: Secondary | ICD-10-CM

## 2022-10-11 LAB — POCT INFLUENZA A/B
Influenza A, POC: NEGATIVE
Influenza B, POC: NEGATIVE

## 2022-10-11 LAB — POC COVID19 BINAXNOW: SARS Coronavirus 2 Ag: POSITIVE — AB

## 2022-10-11 LAB — POCT RAPID STREP A (OFFICE): Rapid Strep A Screen: NEGATIVE

## 2022-10-11 MED ORDER — MOLNUPIRAVIR EUA 200MG CAPSULE: 4.0000 | ORAL_CAPSULE | Freq: Two times a day (BID) | ORAL | 0 refills | Status: AC
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
## 2022-12-08 ENCOUNTER — Encounter: Payer: Self-pay | Admitting: Adult Health

## 2022-12-08 ENCOUNTER — Ambulatory Visit (INDEPENDENT_AMBULATORY_CARE_PROVIDER_SITE_OTHER): Payer: Medicare PPO | Admitting: Adult Health

## 2022-12-08 VITALS — BP 142/80 | HR 70 | Temp 98.1°F | Ht 64.0 in | Wt 258.0 lb

## 2022-12-08 DIAGNOSIS — S76302A Unspecified injury of muscle, fascia and tendon of the posterior muscle group at thigh level, left thigh, initial encounter: Secondary | ICD-10-CM | POA: Diagnosis not present

## 2022-12-08 DIAGNOSIS — F39 Unspecified mood [affective] disorder: Secondary | ICD-10-CM | POA: Diagnosis not present

## 2022-12-08 MED ORDER — METHYLPREDNISOLONE 4 MG PO TBPK: ORAL_TABLET | ORAL | 0 refills | Status: DC

## 2022-12-08 MED ORDER — CYCLOBENZAPRINE HCL 10 MG PO TABS: 10.0000 mg | ORAL_TABLET | Freq: Every day | ORAL | 0 refills | Status: DC

## 2022-12-08 MED ORDER — BUPROPION HCL ER (XL) 150 MG PO TB24: 150.0000 mg | ORAL_TABLET | Freq: Every day | ORAL | 0 refills | Status: DC
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
## 2022-12-29 ENCOUNTER — Ambulatory Visit (INDEPENDENT_AMBULATORY_CARE_PROVIDER_SITE_OTHER): Payer: Medicare PPO | Admitting: Adult Health

## 2022-12-29 ENCOUNTER — Encounter: Payer: Self-pay | Admitting: Adult Health

## 2022-12-29 VITALS — BP 120/80 | HR 94 | Temp 98.5°F | Ht 64.0 in | Wt 250.0 lb

## 2022-12-29 DIAGNOSIS — F39 Unspecified mood [affective] disorder: Secondary | ICD-10-CM

## 2022-12-29 DIAGNOSIS — M26609 Unspecified temporomandibular joint disorder, unspecified side: Secondary | ICD-10-CM | POA: Diagnosis not present

## 2022-12-29 DIAGNOSIS — H9192 Unspecified hearing loss, left ear: Secondary | ICD-10-CM | POA: Diagnosis not present

## 2022-12-29 MED ORDER — CYCLOBENZAPRINE HCL 10 MG PO TABS: 10.0000 mg | ORAL_TABLET | Freq: Every day | ORAL | 0 refills | Status: DC
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
## 2023-01-25 ENCOUNTER — Encounter: Payer: Self-pay | Admitting: Adult Health

## 2023-01-25 ENCOUNTER — Ambulatory Visit (INDEPENDENT_AMBULATORY_CARE_PROVIDER_SITE_OTHER): Payer: Medicare PPO | Admitting: Adult Health

## 2023-01-25 VITALS — BP 128/80 | HR 79 | Temp 98.3°F | Ht 64.0 in | Wt 249.0 lb

## 2023-01-25 DIAGNOSIS — M545 Low back pain, unspecified: Secondary | ICD-10-CM

## 2023-01-25 DIAGNOSIS — T148XXA Other injury of unspecified body region, initial encounter: Secondary | ICD-10-CM

## 2023-01-25 DIAGNOSIS — S39012A Strain of muscle, fascia and tendon of lower back, initial encounter: Secondary | ICD-10-CM

## 2023-01-25 MED ORDER — TIZANIDINE HCL 4 MG PO TABS: 4.0000 mg | ORAL_TABLET | Freq: Four times a day (QID) | ORAL | 0 refills | Status: DC | PRN

## 2023-01-25 MED ORDER — METHYLPREDNISOLONE 4 MG PO TBPK: ORAL_TABLET | ORAL | 0 refills | Status: DC
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
## 2023-02-06 ENCOUNTER — Other Ambulatory Visit: Payer: Self-pay

## 2023-02-06 ENCOUNTER — Encounter (HOSPITAL_COMMUNITY): Payer: Self-pay | Admitting: *Deleted

## 2023-02-06 ENCOUNTER — Emergency Department (HOSPITAL_COMMUNITY): Payer: Medicare PPO

## 2023-02-06 ENCOUNTER — Observation Stay (HOSPITAL_COMMUNITY)
Admission: EM | Admit: 2023-02-06 | Discharge: 2023-02-07 | Disposition: A | Discharge status: Home / Self Care | Payer: Medicare PPO | Attending: Internal Medicine | Admitting: Internal Medicine

## 2023-02-06 DIAGNOSIS — E876 Hypokalemia: Secondary | ICD-10-CM

## 2023-02-06 DIAGNOSIS — F17290 Nicotine dependence, other tobacco product, uncomplicated: Secondary | ICD-10-CM | POA: Insufficient documentation

## 2023-02-06 DIAGNOSIS — R0789 Other chest pain: Principal | ICD-10-CM | POA: Insufficient documentation

## 2023-02-06 DIAGNOSIS — E782 Mixed hyperlipidemia: Secondary | ICD-10-CM | POA: Diagnosis not present

## 2023-02-06 DIAGNOSIS — I480 Paroxysmal atrial fibrillation: Secondary | ICD-10-CM | POA: Insufficient documentation

## 2023-02-06 DIAGNOSIS — Z7982 Long term (current) use of aspirin: Secondary | ICD-10-CM | POA: Insufficient documentation

## 2023-02-06 DIAGNOSIS — Z8673 Personal history of transient ischemic attack (TIA), and cerebral infarction without residual deficits: Secondary | ICD-10-CM | POA: Diagnosis not present

## 2023-02-06 DIAGNOSIS — I5032 Chronic diastolic (congestive) heart failure: Secondary | ICD-10-CM | POA: Diagnosis not present

## 2023-02-06 DIAGNOSIS — R911 Solitary pulmonary nodule: Principal | ICD-10-CM | POA: Insufficient documentation

## 2023-02-06 DIAGNOSIS — E785 Hyperlipidemia, unspecified: Secondary | ICD-10-CM | POA: Diagnosis present

## 2023-02-06 DIAGNOSIS — Z79899 Other long term (current) drug therapy: Secondary | ICD-10-CM | POA: Insufficient documentation

## 2023-02-06 DIAGNOSIS — R6510 Systemic inflammatory response syndrome (SIRS) of non-infectious origin without acute organ dysfunction: Secondary | ICD-10-CM | POA: Insufficient documentation

## 2023-02-06 DIAGNOSIS — Z96651 Presence of right artificial knee joint: Secondary | ICD-10-CM | POA: Diagnosis not present

## 2023-02-06 DIAGNOSIS — I4891 Unspecified atrial fibrillation: Secondary | ICD-10-CM | POA: Diagnosis not present

## 2023-02-06 DIAGNOSIS — F32A Depression, unspecified: Secondary | ICD-10-CM | POA: Diagnosis not present

## 2023-02-06 DIAGNOSIS — R7989 Other specified abnormal findings of blood chemistry: Secondary | ICD-10-CM | POA: Insufficient documentation

## 2023-02-06 DIAGNOSIS — R079 Chest pain, unspecified: Secondary | ICD-10-CM | POA: Diagnosis present

## 2023-02-06 DIAGNOSIS — R0602 Shortness of breath: Secondary | ICD-10-CM | POA: Diagnosis not present

## 2023-02-06 HISTORY — DX: Paroxysmal atrial fibrillation: I48.0

## 2023-02-06 LAB — HEPATIC FUNCTION PANEL
ALT: 20 U/L (ref 0–44)
AST: 32 U/L (ref 15–41)
Albumin: 3.3 g/dL — ABNORMAL LOW (ref 3.5–5.0)
Alkaline Phosphatase: 69 U/L (ref 38–126)
Bilirubin, Direct: 0.6 mg/dL — ABNORMAL HIGH (ref 0.0–0.2)
Indirect Bilirubin: 1 mg/dL — ABNORMAL HIGH (ref 0.3–0.9)
Total Bilirubin: 1.6 mg/dL — ABNORMAL HIGH (ref 0.3–1.2)
Total Protein: 6.1 g/dL — ABNORMAL LOW (ref 6.5–8.1)

## 2023-02-06 LAB — CBC
HCT: 45.2 % (ref 39.0–52.0)
Hemoglobin: 14.8 g/dL (ref 13.0–17.0)
MCH: 29.2 pg (ref 26.0–34.0)
MCHC: 32.7 g/dL (ref 30.0–36.0)
MCV: 89.2 fL (ref 80.0–100.0)
Platelets: 242 10*3/uL (ref 150–400)
RBC: 5.07 MIL/uL (ref 4.22–5.81)
RDW: 14 % (ref 11.5–15.5)
WBC: 12.1 10*3/uL — ABNORMAL HIGH (ref 4.0–10.5)
nRBC: 0 % (ref 0.0–0.2)

## 2023-02-06 LAB — BASIC METABOLIC PANEL
Anion gap: 12 (ref 5–15)
BUN: 19 mg/dL (ref 8–23)
CO2: 22 mmol/L (ref 22–32)
Calcium: 8.5 mg/dL — ABNORMAL LOW (ref 8.9–10.3)
Chloride: 101 mmol/L (ref 98–111)
Creatinine, Ser: 1.22 mg/dL (ref 0.61–1.24)
GFR, Estimated: >60 mL/min (ref >60)
Glucose, Bld: 139 mg/dL — ABNORMAL HIGH (ref 70–99)
Potassium: 3.2 mmol/L — ABNORMAL LOW (ref 3.5–5.1)
Sodium: 135 mmol/L (ref 135–145)

## 2023-02-06 LAB — LIPASE, BLOOD: Lipase: 23 U/L (ref 11–51)

## 2023-02-06 LAB — MAGNESIUM: Magnesium: 2.2 mg/dL (ref 1.7–2.4)

## 2023-02-06 LAB — TROPONIN I (HIGH SENSITIVITY)
Troponin I (High Sensitivity): 14 ng/L (ref <18)
Troponin I (High Sensitivity): 24 ng/L — ABNORMAL HIGH (ref <18)

## 2023-02-06 MED ORDER — ATORVASTATIN CALCIUM 80 MG PO TABS
80.0000 mg | ORAL_TABLET | Freq: Every day | ORAL | Status: DC
  Administered 2023-02-07: 80 mg via ORAL
  Filled 2023-02-06: qty 1

## 2023-02-06 MED ORDER — DILTIAZEM HCL-DEXTROSE 125-5 MG/125ML-% IV SOLN (PREMIX)
5.0000 mg/h | INTRAVENOUS | Status: DC
  Filled 2023-02-06: qty 125

## 2023-02-06 MED ORDER — POTASSIUM CHLORIDE 20 MEQ PO PACK
40.0000 meq | PACK | Freq: Once | ORAL | Status: AC
  Administered 2023-02-06: 40 meq via ORAL
  Filled 2023-02-06: qty 2

## 2023-02-06 MED ORDER — METOPROLOL TARTRATE 12.5 MG HALF TABLET
12.5000 mg | ORAL_TABLET | Freq: Two times a day (BID) | ORAL | Status: DC
  Administered 2023-02-07 (×2): 12.5 mg via ORAL
  Filled 2023-02-06 (×2): qty 1

## 2023-02-06 MED ORDER — MELATONIN 3 MG PO TABS: 3.0000 mg | ORAL_TABLET | Freq: Every evening | ORAL | Status: DC | PRN

## 2023-02-06 MED ORDER — FAMOTIDINE 20 MG PO TABS
20.0000 mg | ORAL_TABLET | Freq: Once | ORAL | Status: AC
  Administered 2023-02-06: 20 mg via ORAL
  Filled 2023-02-06: qty 1

## 2023-02-06 MED ORDER — DILTIAZEM LOAD VIA INFUSION
10.0000 mg | Freq: Once | INTRAVENOUS | Status: DC
  Filled 2023-02-06: qty 10

## 2023-02-06 MED ORDER — IOHEXOL 350 MG/ML SOLN
75.0000 mL | Freq: Once | INTRAVENOUS | Status: AC | PRN
  Administered 2023-02-06: 75 mL via INTRAVENOUS

## 2023-02-06 MED ORDER — ONDANSETRON HCL 4 MG/2ML IJ SOLN: 4.0000 mg | Freq: Four times a day (QID) | INTRAMUSCULAR | Status: DC | PRN

## 2023-02-06 MED ORDER — LACTATED RINGERS IV BOLUS
1000.0000 mL | Freq: Once | INTRAVENOUS | Status: AC
  Administered 2023-02-06: 1000 mL via INTRAVENOUS

## 2023-02-06 MED ORDER — ASPIRIN 81 MG PO CHEW
324.0000 mg | CHEWABLE_TABLET | Freq: Once | ORAL | Status: AC
  Administered 2023-02-06: 324 mg via ORAL
  Filled 2023-02-06: qty 4

## 2023-02-06 MED ORDER — ALUM & MAG HYDROXIDE-SIMETH 200-200-20 MG/5ML PO SUSP
30.0000 mL | Freq: Once | ORAL | Status: AC
  Administered 2023-02-06: 30 mL via ORAL
  Filled 2023-02-06: qty 30

## 2023-02-06 MED ORDER — HEPARIN (PORCINE) 25000 UT/250ML-% IV SOLN
1050.0000 [IU]/h | INTRAVENOUS | Status: DC
  Administered 2023-02-06: 1050 [IU]/h via INTRAVENOUS
  Filled 2023-02-06: qty 250

## 2023-02-06 MED ORDER — ACETAMINOPHEN 650 MG RE SUPP: 650.0000 mg | Freq: Four times a day (QID) | RECTAL | Status: DC | PRN

## 2023-02-06 MED ORDER — ACETAMINOPHEN 325 MG PO TABS: 650.0000 mg | ORAL_TABLET | Freq: Four times a day (QID) | ORAL | Status: DC | PRN

## 2023-02-06 MED ORDER — HEPARIN BOLUS VIA INFUSION
4000.0000 [IU] | Freq: Once | INTRAVENOUS | Status: AC
  Administered 2023-02-06: 4000 [IU] via INTRAVENOUS
  Filled 2023-02-06: qty 4000

## 2023-02-06 MED ORDER — FENTANYL CITRATE PF 50 MCG/ML IJ SOSY
50.0000 ug | PREFILLED_SYRINGE | Freq: Once | INTRAMUSCULAR | Status: AC
  Administered 2023-02-06: 50 ug via INTRAVENOUS
  Filled 2023-02-06: qty 1

## 2023-02-06 MED ORDER — NITROGLYCERIN 0.4 MG SL SUBL: 0.4000 mg | SUBLINGUAL_TABLET | SUBLINGUAL | Status: DC | PRN

## 2023-02-06 NOTE — Discharge Instructions (Signed)
5. 9.1 x 5.9 mm right lower lobe pulmonary nodule. Consider one of  the following in 3 months for both low-risk and high-risk  individuals: (a) repeat chest CT, (b) follow-up PET-CT, or (c)  tissue sampling. This recommendation follows the consensus  statement: Guidelines for Management of Incidental Pulmonary Nodules  Detected on CT Images: From the Fleischner Society 2017; Radiology  2017; 284:228-243.

## 2023-02-06 NOTE — ED Notes (Signed)
Patient transported to X-ray 

## 2023-02-06 NOTE — H&P (Signed)
History and Physical      Adams Menaker Hayward ZOX:[id number] DOB: Apr 25, 1956 DOA: 02/06/2023; DOS: 02/06/2023  PCP: Shirline Frees, NP *** Patient coming from: home ***  I have personally briefly reviewed patient's old medical records in [facility]  Chief Complaint: ***  HPI: Cristian Gonzales is a 67 y.o. male with medical history significant for *** who is admitted to [facility] on 02/06/2023 with *** after presenting from home*** to [facility] ED complaining of ***.    ***       ***   ED Course:  Vital signs in the ED were notable for the following: ***  Labs were notable for the following: ***  Per my interpretation, EKG in ED demonstrated the following:  ***  Imaging and additional notable ED work-up: ***  EDP discussed case with on-call cardiology fellow, who recommended [facility] admission, diltiazem drip, heparin drip (for the atrial fibrillation as opposed to acs), trending of trop, and conveyed at cardiology is available prn for additional consultation.   While in the ED, the following were administered: ***  Subsequently, the patient was admitted  ***  ***red    Review of Systems: As per HPI otherwise 10 point review of systems negative.   Past Medical History:  Diagnosis Date   Anxiety    Arthritis    Cervical spine fracture (HCC)    13 -diving accident, no neurolgic sequelae   Depression    Stroke (HCC)    Syncope 11/27/2012   From Tramadol    Past Surgical History:  Procedure Laterality Date   BUBBLE STUDY  03/19/2021   Procedure: BUBBLE STUDY;  Surgeon: Wendall Stade, MD;  Location: [facility] ENDOSCOPY;  Service: Cardiovascular;;   CERVICAL DISCECTOMY  2004   dr Channing Mutters   KNEE SURGERY  1981   right   LOOP RECORDER INSERTION N/A 03/19/2021   Procedure: LOOP RECORDER INSERTION;  Surgeon: Lanier Prude, MD;  Location: [facility] INVASIVE CV LAB;  Service: Cardiovascular;  Laterality: N/A;   NECK SURGERY     REPLACEMENT TOTAL KNEE     right   REPLACEMENT  TOTAL KNEE     TEE WITHOUT CARDIOVERSION N/A 03/19/2021   Procedure: TRANSESOPHAGEAL ECHOCARDIOGRAM (TEE);  Surgeon: Wendall Stade, MD;  Location: [facility] ENDOSCOPY;  Service: Cardiovascular;  Laterality: N/A;    Social History:  reports that he has been smoking cigars. He has never used smokeless tobacco. He reports that he does not drink alcohol and does not use drugs.   Allergies  Allergen Reactions   Tramadol Other (See Comments)    Passed out   Morphine Other (See Comments)    Left red bumps on the back and did not work   Trazodone And Nefazodone Other (See Comments)    Made the patient feel flushed,with blurred vision and muscle cramps     Family History  Problem Relation Age of Onset   Heart attack Father 80       Sudden Cardiac Death   Liver cancer Mother 70    Family history reviewed and not pertinent ***   Prior to Admission medications   Medication Sig Start Date End Date Taking? Authorizing Provider  albuterol (VENTOLIN HFA) 108 (90 Base) MCG/ACT inhaler TAKE 2 PUFFS BY MOUTH EVERY 6 HOURS AS NEEDED FOR WHEEZE OR SHORTNESS OF BREATH Patient taking differently: Inhale 2 puffs into the lungs every 6 (six) hours as needed for wheezing or shortness of breath. 01/29/22  Yes Nafziger, Kandee Keen, NP  aspirin EC 81 MG EC tablet Take 1 tablet (81 mg total) by mouth daily. Swallow whole. Patient taking differently: Take 81 mg by mouth 2 (two) times a week. Swallow whole. 03/20/21  Yes Metzger-Cihelka, Desiree, NP  atorvastatin (LIPITOR) 80 MG tablet TAKE 1 TABLET EVERY DAY Patient taking differently: Take 80 mg by mouth 2 (two) times a week. 05/18/22  Yes Nafziger, Kandee Keen, NP  buPROPion (WELLBUTRIN XL) 150 MG 24 hr tablet Take 1 tablet (150 mg total) by mouth daily. 12/08/22  Yes Nafziger, Kandee Keen, NP  cyclobenzaprine (FLEXERIL) 10 MG tablet Take 1 tablet (10 mg total) by mouth at bedtime. Patient not taking: Reported on 02/06/2023 12/29/22   Shirline Frees, NP  methylPREDNISolone (MEDROL  DOSEPAK) 4 MG TBPK tablet Take as directed Patient not taking: Reported on 02/06/2023 01/25/23   Shirline Frees, NP  tiZANidine (ZANAFLEX) 4 MG tablet Take 1 tablet (4 mg total) by mouth every 6 (six) hours as needed for muscle spasms. Patient not taking: Reported on 02/06/2023 01/25/23   Shirline Frees, NP     Objective    Physical Exam: Vitals:   02/06/23 1926 02/06/23 1942 02/06/23 2030  BP: (!) 149/101  119/84  Pulse: 66  (!) 108  Resp: 17  18  Temp: 99.1 F (37.3 C)    TempSrc: Oral    SpO2: 93%  97%  Weight:  112.9 kg   Height:  5\' 4"  (1.626 m)     General: appears to be stated age; alert, oriented Skin: warm, dry, no rash Head:  AT/[place] Mouth:  Oral mucosa membranes appear moist, normal dentition Neck: supple; trachea midline Heart:  RRR; did not appreciate any M/R/G Lungs: CTAB, did not appreciate any wheezes, rales, or rhonchi Abdomen: + BS; soft, ND, NT Vascular: 2+ pedal pulses b/l; 2+ radial pulses b/l Extremities: no peripheral edema, no muscle wasting Neuro: strength and sensation intact in upper and lower extremities b/l ***   *** Neuro: 5/5 strength of the proximal and distal flexors and extensors of the upper and lower extremities bilaterally; sensation intact in upper and lower extremities b/l; cranial nerves II through XII grossly intact; no pronator drift; no evidence suggestive of slurred speech, dysarthria, or facial droop; Normal muscle tone. No tremors.  *** Neuro: In the setting of the patient's current mental status and associated inability to follow instructions, unable to perform full neurologic exam at this time.  As such, assessment of strength, sensation, and cranial nerves is limited at this time. Patient noted to spontaneously move all 4 extremities. No tremors.  ***    Labs on Admission: I have personally reviewed following labs and imaging studies  CBC: Recent Labs  Lab 02/06/23 1948  WBC 12.1*  HGB 14.8  HCT 45.2  MCV 89.2  PLT 242    Basic Metabolic Panel: Recent Labs  Lab 02/06/23 1948  NA 135  K 3.2*  CL 101  CO2 22  GLUCOSE 139*  BUN 19  CREATININE 1.22  CALCIUM 8.5*   GFR: Estimated Creatinine Clearance: 67.1 mL/min (by C-G formula based on SCr of 1.22 mg/dL). Liver Function Tests: Recent Labs  Lab 02/06/23 2118  AST 32  ALT 20  ALKPHOS 69  BILITOT 1.6*  PROT 6.1*  ALBUMIN 3.3*   Recent Labs  Lab 02/06/23 2118  LIPASE 23   No results for input(s): "AMMONIA" in the last 168 hours. Coagulation Profile: No results for input(s): "INR", "PROTIME" in the last 168 hours. Cardiac Enzymes: No results for input(s): "CKTOTAL", "  CKMB", "CKMBINDEX", "TROPONINI" in the last 168 hours. BNP (last 3 results) No results for input(s): "PROBNP" in the last 8760 hours. HbA1C: No results for input(s): "HGBA1C" in the last 72 hours. CBG: No results for input(s): "GLUCAP" in the last 168 hours. Lipid Profile: No results for input(s): "CHOL", "HDL", "LDLCALC", "TRIG", "CHOLHDL", "LDLDIRECT" in the last 72 hours. Thyroid Function Tests: No results for input(s): "TSH", "T4TOTAL", "FREET4", "T3FREE", "THYROIDAB" in the last 72 hours. Anemia Panel: No results for input(s): "VITAMINB12", "FOLATE", "FERRITIN", "TIBC", "IRON", "RETICCTPCT" in the last 72 hours. Urine analysis:    Component Value Date/Time   COLORURINE YELLOW 03/17/2021 0211   APPEARANCEUR CLEAR 03/17/2021 0211   LABSPEC 1.045 (H) 03/17/2021 0211   PHURINE 6.0 03/17/2021 0211   GLUCOSEU NEGATIVE 03/17/2021 0211   HGBUR NEGATIVE 03/17/2021 0211   HGBUR negative 10/31/2009 0923   BILIRUBINUR NEGATIVE 03/17/2021 0211   BILIRUBINUR n 07/25/2015 0942   KETONESUR NEGATIVE 03/17/2021 0211   PROTEINUR NEGATIVE 03/17/2021 0211   UROBILINOGEN 0.2 07/25/2015 0942   UROBILINOGEN 0.2 01/26/2010 1938   NITRITE NEGATIVE 03/17/2021 0211   LEUKOCYTESUR NEGATIVE 03/17/2021 0211    Radiological Exams on Admission: CT Angio Chest Pulmonary Embolism (PE) W  or WO Contrast  Result Date: 02/06/2023 CLINICAL DATA:  Pulmonary embolism suspected. High probability. Chest pain or shortness of breath with dizziness since yesterday. EXAM: CT ANGIOGRAPHY CHEST WITH CONTRAST TECHNIQUE: Multidetector CT imaging of the chest was performed using the standard protocol during bolus administration of intravenous contrast. Multiplanar CT image reconstructions and MIPs were obtained to evaluate the vascular anatomy. RADIATION DOSE REDUCTION: This exam was performed according to the departmental dose-optimization program which includes automated exposure control, adjustment of the mA and/or kV according to patient size and/or use of iterative reconstruction technique. CONTRAST:  75mL OMNIPAQUE IOHEXOL 350 MG/ML SOLN COMPARISON:  PA and lateral chest today, PA and lateral chest 07/27/2022. No prior chest CT for comparison. FINDINGS: Cardiovascular: There is a loop recorder device implanted in the anterior reveal left chest wall. The heart is slightly enlarged, with left ventricular and septal wall hypertrophy. Scattered single-vessel coronary artery calcific plaques noted in the LAD. There is no pericardial effusion. Pulmonary veins are normal caliber. Respiratory motion obscures the subsegmental arterial bed in the lower lung fields. The pulmonary arteries are normal in caliber and otherwise without evidence of thromboembolism. There is scattered calcific plaque in the aorta and left subclavian artery. There is no aortic or great vessel aneurysm, dissection or stenosis. Mediastinum/Nodes: No enlarged mediastinal, hilar, or axillary lymph nodes. Thyroid gland, trachea, and esophagus demonstrate no significant findings. Lungs/Pleura: No pleural effusion, thickening or pneumothorax. Mild chronic elevation again noted right hemidiaphragm. The central airways are patent, but there is noted diffuse bronchial thickening, without bronchiectasis or visible bronchial plugging. The lungs are clear  of infiltrates. There are scattered linear scar-like opacities in the bases. Also suspected scattered air trapping in the lower zones. There is a solitary well-circumscribed 9.1 x 5.9 mm ovoid pulmonary nodule in the right lower lobe, best seen on 6:67. There are no other visible nodules. Upper Abdomen: Moderate hepatic steatosis.  No acute abnormality. Musculoskeletal: Partially visible anterior fusion plating lower C-spine ending at C7. Multilevel bulky bridging enthesopathy right anterolateral lower T-spine with multilevel degenerative discs. No acute or other significant osseous findings or chest wall masses. Review of the MIP images confirms the above findings. IMPRESSION: 1. No evidence of arterial dilatation or embolus as far as visualized. Subsegmental lower zonal arteries are  obscured by breathing motion. 2. Cardiomegaly with left ventricular and septal wall hypertrophy. 3. Aortic and LAD coronary artery atherosclerosis. 4. Bronchitis with scattered air trapping in the lower zones. 5. 9.1 x 5.9 mm right lower lobe pulmonary nodule. Consider one of the following in 3 months for both low-risk and high-risk individuals: (a) repeat chest CT, (b) follow-up PET-CT, or (c) tissue sampling. This recommendation follows the consensus statement: Guidelines for Management of Incidental Pulmonary Nodules Detected on CT Images: From the Fleischner Society 2017; Radiology 2017; 284:228-243. 6. Moderate hepatic steatosis. Electronically Signed   By: Almira Bar M.D.   On: 02/06/2023 21:50   DG Chest 2 View  Result Date: 02/06/2023 CLINICAL DATA:  Chest pain EXAM: CHEST - 2 VIEW COMPARISON:  Chest x-ray 10/28/2015 FINDINGS: Loop recorder device is seen in the anterior left chest. The heart size and mediastinal contours are within normal limits. Both lungs are clear. Cervical spinal fusion plate is present. No acute fractures are seen. IMPRESSION: No active cardiopulmonary disease. Electronically Signed   By: Darliss Cheney M.D.   On: 02/06/2023 20:41      Assessment/Plan   Principal Problem:   Chest pain   ***       ***            ***             #) Right lower lobe pulmonary nodule: Noted incidentally on today's CTA chest, measuring 9.1 x 5.9 mm. radiology recommends pursuing 1 of the following 3 options in 3 months: Repeat CT chest versus PET CT versus tissue sampling.   Plan: per radiology, will convey the following 3 options for follow-up to occur in 3 months time: Repeat CT chest versus PET CT versus tissue sampling.           ***            ***            ***             ***           ***           ***           ***           ***          ***          ***  DVT prophylaxis: SCD's ***  Code Status: Full code*** Family Communication: none*** Disposition Plan: Per Rounding Team Consults called: none***;  Admission status: ***     I SPENT GREATER THAN 75 *** MINUTES IN CLINICAL CARE TIME/MEDICAL DECISION-MAKING IN COMPLETING THIS ADMISSION.      Chaney Born Lacey Wallman DO [facility]  From 7PM - 7AM   02/06/2023, 11:14 PM   ***

## 2023-02-06 NOTE — ED Triage Notes (Signed)
The pt has had chest pain and sob since yesterday.   Dizziness

## 2023-02-06 NOTE — Progress Notes (Signed)
ANTICOAGULATION CONSULT NOTE - Initial Consult  Pharmacy Consult for heparin Indication: chest pain/ACS  Allergies  Allergen Reactions   Tramadol Other (See Comments)    Passed out   Morphine Other (See Comments)    Left red bumps on the back and did not work   Trazodone And Nefazodone Other (See Comments)    Made the patient feel flushed,with blurred vision and muscle cramps     Patient Measurements: Height: 5\' 4"  (162.6 cm) Weight: 112.9 kg (248 lb 14.4 oz) IBW/kg (Calculated) : 59.2 Heparin Dosing Weight: 85.7 kg  Vital Signs: Temp: 99.1 F (37.3 C) (06/02 1926) Temp Source: Oral (06/02 1926) BP: 119/84 (06/02 2030) Pulse Rate: 108 (06/02 2030)  Labs: Recent Labs    02/06/23 1948 02/06/23 2118  HGB 14.8  --   HCT 45.2  --   PLT 242  --   CREATININE 1.22  --   TROPONINIHS 14 24*    Estimated Creatinine Clearance: 67.1 mL/min (by C-G formula based on SCr of 1.22 mg/dL).   Medical History: Past Medical History:  Diagnosis Date   Anxiety    Arthritis    Cervical spine fracture (HCC)    13 -diving accident, no neurolgic sequelae   Depression    Stroke (HCC)    Syncope 11/27/2012   From Tramadol    Medications:  (Not in a hospital admission)  Scheduled:   aspirin  324 mg Oral Once   diltiazem  10 mg Intravenous Once    Assessment: 60 yoM presented to the ED with chief complaint of persistent left sided chest pain. Pharmacy has been consulted to dose heparin for ACS/STEMI. Trops 24, Hgb 14.8, plts 242. I verified with the patient and not on any anticoagulants PTA.  Goal of Therapy:  Heparin level 0.3-0.7 units/ml Monitor platelets by anticoagulation protocol: Yes   Plan:  Give 4000 units bolus x 1 Start heparin infusion at 1050 units/hr Check anti-Xa level in 6 hours and daily while on heparin Continue to monitor H&H and platelets Monitor for signs/symptoms of bleeding  Arabella Merles, PharmD. [facility] Acute Care PGY-1 02/06/2023 10:36 PM

## 2023-02-06 NOTE — ED Notes (Signed)
Pt HR noted to be in the 120s. EKG captured and provided to Dr. Doran Durand

## 2023-02-06 NOTE — ED Provider Notes (Signed)
[place] [facility] AT [facility] Provider Note   CSN: [id number] Arrival date & time: 02/06/23  1921     History Chief Complaint  Patient presents with   Chest Pain    HPI Cristian Gonzales is a 67 y.o. male presenting for chief complaint of left-sided chest pain.  States that it started approximately 5 PM this evening is persistent nature.  No history of similar.  Has been ill with a febrile illness for the last 5 days but has been getting better until today.  Today he wanted to push through the discomfort comes in with substantial chest pain lightheadedness, feelings of fatigue..   Patient's recorded medical, surgical, social, medication list and allergies were reviewed in the Snapshot window as part of the initial history.   Review of Systems   Review of Systems  Constitutional:  Positive for fatigue. Negative for chills and fever.  HENT:  Negative for ear pain and sore throat.   Eyes:  Negative for pain and visual disturbance.  Respiratory:  Negative for cough and shortness of breath.   Cardiovascular:  Positive for chest pain. Negative for palpitations.  Gastrointestinal:  Negative for abdominal pain and vomiting.  Genitourinary:  Negative for dysuria and hematuria.  Musculoskeletal:  Negative for arthralgias and back pain.  Skin:  Negative for color change and rash.  Neurological:  Negative for seizures and syncope.  All other systems reviewed and are negative.   Physical Exam Updated Vital Signs BP 117/85   Pulse 78   Temp 99.1 F (37.3 C) (Oral)   Resp 18   Ht 5\' 4"  (1.626 m)   Wt 112.9 kg   SpO2 98%   BMI 42.72 kg/m  Physical Exam Vitals and nursing note reviewed.  Constitutional:      General: He is not in acute distress.    Appearance: He is well-developed.  HENT:     Head: Normocephalic and atraumatic.  Eyes:     Conjunctiva/sclera: Conjunctivae normal.  Cardiovascular:     Rate and Rhythm: Normal rate and regular rhythm.      Heart sounds: No murmur heard. Pulmonary:     Effort: Pulmonary effort is normal. No respiratory distress.     Breath sounds: Normal breath sounds.  Abdominal:     Palpations: Abdomen is soft.     Tenderness: There is no abdominal tenderness.  Musculoskeletal:        General: No swelling.     Cervical back: Neck supple.  Skin:    General: Skin is warm and dry.     Capillary Refill: Capillary refill takes less than 2 seconds.  Neurological:     Mental Status: He is alert.  Psychiatric:        Mood and Affect: Mood normal.      ED Course/ Medical Decision Making/ A&P Clinical Course as of 02/06/23 2349  Sun Feb 06, 2023  2156 5. 9.1 x 5.9 mm right lower lobe pulmonary nodule. Consider one of the following in 3 months for both low-risk and high-risk individuals: (a) repeat chest CT, (b) follow-up PET-CT, or (c) tissue sampling. This recommendation follows the consensus statement: Guidelines for Management of Incidental Pulmonary Nodules Detected on CT Images: From the Fleischner Society 2017; Radiology 2017; 284:228-243.   [CC]    Clinical Course User Index [CC] Glyn Ade, MD    Procedures .Critical Care  Performed by: Glyn Ade, MD Authorized by: Glyn Ade, MD   Critical care provider statement:  Critical care time (minutes):  30   Critical care was necessary to treat or prevent imminent or life-threatening deterioration of the following conditions:  Circulatory failure and cardiac failure   Critical care was time spent personally by me on the following activities:  Development of treatment plan with patient or surrogate, discussions with consultants, evaluation of patient's response to treatment, examination of patient, ordering and review of laboratory studies, ordering and review of radiographic studies, ordering and performing treatments and interventions, pulse oximetry, re-evaluation of patient's condition and review of old charts     Medications Ordered in ED Medications  aspirin chewable tablet 324 mg (has no administration in time range)  diltiazem (CARDIZEM) 1 mg/mL load via infusion 10 mg (has no administration in time range)    And  diltiazem (CARDIZEM) 125 mg in dextrose 5% 125 mL (1 mg/mL) infusion (has no administration in time range)  heparin bolus via infusion 4,000 Units (has no administration in time range)  heparin ADULT infusion 100 units/mL (25000 units/238mL) (has no administration in time range)  nitroGLYCERIN (NITROSTAT) SL tablet 0.4 mg (has no administration in time range)  acetaminophen (TYLENOL) tablet 650 mg (has no administration in time range)    Or  acetaminophen (TYLENOL) suppository 650 mg (has no administration in time range)  melatonin tablet 3 mg (has no administration in time range)  ondansetron (ZOFRAN) injection 4 mg (has no administration in time range)  atorvastatin (LIPITOR) tablet 80 mg (has no administration in time range)  lactated ringers bolus 1,000 mL (0 mLs Intravenous Stopped 02/06/23 2232)  fentaNYL (SUBLIMAZE) injection 50 mcg (50 mcg Intravenous Given 02/06/23 2118)  famotidine (PEPCID) tablet 20 mg (20 mg Oral Given 02/06/23 2138)  alum & mag hydroxide-simeth (MAALOX/MYLANTA) 200-200-20 MG/5ML suspension 30 mL (30 mLs Oral Given 02/06/23 2139)  iohexol (OMNIPAQUE) 350 MG/ML injection 75 mL (75 mLs Intravenous Contrast Given 02/06/23 2130)  potassium chloride (KLOR-CON) packet 40 mEq (40 mEq Oral Given 02/06/23 2145)   Medical Decision Making: Cristian Gonzales is a 67 y.o. male who presented to the ED today with chest pain, detailed above.  Based on patient's comorbidities, patient has a heart score of 5.    Patient's presentation is complicated by their history of multiple comorbid medical problems.  Patient placed on continuous vitals and telemetry monitoring while in ED which was reviewed periodically.  Complete initial physical exam performed, notably the patient was initially  hemodynamically stable though he intermittently shoots his heart rate up to 150 and A-fib with RVR.   Reviewed and confirmed nursing documentation for past medical history, family history, social history.    Initial Assessment: With the patient's presentation of left-sided chest pain, most likely diagnosis is musculoskeletal chest pain versus GERD, although ACS remains on the differential. Other diagnoses were considered including (but not limited to) pulmonary embolism, community-acquired pneumonia, aortic dissection, pneumothorax, underlying bony abnormality, anemia. These are considered less likely due to history of present illness and physical exam findings.    In particular, concerning pulmonary embolism: they deny malignancy, recent surgery, history of DVT, or calf tenderness leading to a low risk Wells score. Aortic Dissection also reconsidered but seems less likely based on the location, quality, onset, and severity of symptoms in this case. Patient has a lack of serious comorbidities for this condition including a lack of HTN or Smoking. Patient also has a lack of underlying history of AD or TAA.    Initial Plan: Evaluate for ACS with delta troponin and EKG  evaluated as below  Evaluate for dissection, bony abnormality, or pneumonia with chest x-ray and screening laboratory evaluation including CBC, BMP  Further evaluation for pulmonary embolism not indicated at this time based on patient's PERC and Wells score.  Further evaluation for Thoracic Aortic Dissection not indicated at this time based on patient's clinical history and PE findings.   Initial Study Results: EKG was reviewed independently. Rate, rhythm, axis, intervals all examined and without medically relevant abnormality. ST segments without concerns for elevations.    Laboratory  Delta troponin demonstrated positive delta 10  CBC and BMP without obvious metabolic or inflammatory abnormalities requiring further evaluation    Radiology  CT Angio Chest Pulmonary Embolism (PE) W or WO Contrast  Result Date: 02/06/2023 CLINICAL DATA:  Pulmonary embolism suspected. High probability. Chest pain or shortness of breath with dizziness since yesterday. EXAM: CT ANGIOGRAPHY CHEST WITH CONTRAST TECHNIQUE: Multidetector CT imaging of the chest was performed using the standard protocol during bolus administration of intravenous contrast. Multiplanar CT image reconstructions and MIPs were obtained to evaluate the vascular anatomy. RADIATION DOSE REDUCTION: This exam was performed according to the departmental dose-optimization program which includes automated exposure control, adjustment of the mA and/or kV according to patient size and/or use of iterative reconstruction technique. CONTRAST:  75mL OMNIPAQUE IOHEXOL 350 MG/ML SOLN COMPARISON:  PA and lateral chest today, PA and lateral chest 07/27/2022. No prior chest CT for comparison. FINDINGS: Cardiovascular: There is a loop recorder device implanted in the anterior reveal left chest wall. The heart is slightly enlarged, with left ventricular and septal wall hypertrophy. Scattered single-vessel coronary artery calcific plaques noted in the LAD. There is no pericardial effusion. Pulmonary veins are normal caliber. Respiratory motion obscures the subsegmental arterial bed in the lower lung fields. The pulmonary arteries are normal in caliber and otherwise without evidence of thromboembolism. There is scattered calcific plaque in the aorta and left subclavian artery. There is no aortic or great vessel aneurysm, dissection or stenosis. Mediastinum/Nodes: No enlarged mediastinal, hilar, or axillary lymph nodes. Thyroid gland, trachea, and esophagus demonstrate no significant findings. Lungs/Pleura: No pleural effusion, thickening or pneumothorax. Mild chronic elevation again noted right hemidiaphragm. The central airways are patent, but there is noted diffuse bronchial thickening, without  bronchiectasis or visible bronchial plugging. The lungs are clear of infiltrates. There are scattered linear scar-like opacities in the bases. Also suspected scattered air trapping in the lower zones. There is a solitary well-circumscribed 9.1 x 5.9 mm ovoid pulmonary nodule in the right lower lobe, best seen on 6:67. There are no other visible nodules. Upper Abdomen: Moderate hepatic steatosis.  No acute abnormality. Musculoskeletal: Partially visible anterior fusion plating lower C-spine ending at C7. Multilevel bulky bridging enthesopathy right anterolateral lower T-spine with multilevel degenerative discs. No acute or other significant osseous findings or chest wall masses. Review of the MIP images confirms the above findings. IMPRESSION: 1. No evidence of arterial dilatation or embolus as far as visualized. Subsegmental lower zonal arteries are obscured by breathing motion. 2. Cardiomegaly with left ventricular and septal wall hypertrophy. 3. Aortic and LAD coronary artery atherosclerosis. 4. Bronchitis with scattered air trapping in the lower zones. 5. 9.1 x 5.9 mm right lower lobe pulmonary nodule. Consider one of the following in 3 months for both low-risk and high-risk individuals: (a) repeat chest CT, (b) follow-up PET-CT, or (c) tissue sampling. This recommendation follows the consensus statement: Guidelines for Management of Incidental Pulmonary Nodules Detected on CT Images: From the Fleischner Society 2017; Radiology  2017; 098:119-147. 6. Moderate hepatic steatosis. Electronically Signed   By: Almira Bar M.D.   On: 02/06/2023 21:50   DG Chest 2 View  Result Date: 02/06/2023 CLINICAL DATA:  Chest pain EXAM: CHEST - 2 VIEW COMPARISON:  Chest x-ray 10/28/2015 FINDINGS: Loop recorder device is seen in the anterior left chest. The heart size and mediastinal contours are within normal limits. Both lungs are clear. Cervical spinal fusion plate is present. No acute fractures are seen. IMPRESSION: No  active cardiopulmonary disease. Electronically Signed   By: Darliss Cheney M.D.   On: 02/06/2023 20:41     Final Assessment and Plan: Patient's history present illness and physical exam findings are complex.  He is having ongoing chest pain despite administration of multiple medications, delta troponin is positive.  He is going in and out of A-fib with RVR. Treating the A-fib RVR with diltiazem bolus and infusion for stabilization of his RVR. Additionally starting on heparin for anticoagulation.  Getting an aspirin load for positive troponin delta and CHG was consulted for further care and management.   Disposition:  I have considered need for hospitalization, however, considering all of the above, I believe this patient is stable for discharge at this time.  Patient/family educated about specific return precautions for given chief complaint and symptoms.  Patient/family educated about follow-up with PCP.     Patient/family expressed understanding of return precautions and need for follow-up. Patient spoken to regarding all imaging and laboratory results and appropriate follow up for these results. All education provided in verbal form with additional information in written form. Time was allowed for answering of patient questions. Patient discharged.    Emergency Department Medication Summary:   Medications  aspirin chewable tablet 324 mg (has no administration in time range)  diltiazem (CARDIZEM) 1 mg/mL load via infusion 10 mg (has no administration in time range)    And  diltiazem (CARDIZEM) 125 mg in dextrose 5% 125 mL (1 mg/mL) infusion (has no administration in time range)  heparin bolus via infusion 4,000 Units (has no administration in time range)  heparin ADULT infusion 100 units/mL (25000 units/222mL) (has no administration in time range)  nitroGLYCERIN (NITROSTAT) SL tablet 0.4 mg (has no administration in time range)  acetaminophen (TYLENOL) tablet 650 mg (has no administration  in time range)    Or  acetaminophen (TYLENOL) suppository 650 mg (has no administration in time range)  melatonin tablet 3 mg (has no administration in time range)  ondansetron (ZOFRAN) injection 4 mg (has no administration in time range)  atorvastatin (LIPITOR) tablet 80 mg (has no administration in time range)  lactated ringers bolus 1,000 mL (0 mLs Intravenous Stopped 02/06/23 2232)  fentaNYL (SUBLIMAZE) injection 50 mcg (50 mcg Intravenous Given 02/06/23 2118)  famotidine (PEPCID) tablet 20 mg (20 mg Oral Given 02/06/23 2138)  alum & mag hydroxide-simeth (MAALOX/MYLANTA) 200-200-20 MG/5ML suspension 30 mL (30 mLs Oral Given 02/06/23 2139)  iohexol (OMNIPAQUE) 350 MG/ML injection 75 mL (75 mLs Intravenous Contrast Given 02/06/23 2130)  potassium chloride (KLOR-CON) packet 40 mEq (40 mEq Oral Given 02/06/23 2145)           Clinical Impression:  1. Pulmonary nodule   2. Atrial fibrillation with RVR (HCC)      Admit   Final Clinical Impression(s) / ED Diagnoses Final diagnoses:  Pulmonary nodule  Atrial fibrillation with RVR (HCC)    Rx / DC Orders ED Discharge Orders     None  Glyn Ade, MD 02/06/23 [phone]

## 2023-02-07 ENCOUNTER — Other Ambulatory Visit (HOSPITAL_COMMUNITY): Payer: Self-pay

## 2023-02-07 ENCOUNTER — Inpatient Hospital Stay (HOSPITAL_BASED_OUTPATIENT_CLINIC_OR_DEPARTMENT_OTHER): Payer: Medicare PPO

## 2023-02-07 DIAGNOSIS — I5032 Chronic diastolic (congestive) heart failure: Secondary | ICD-10-CM | POA: Diagnosis present

## 2023-02-07 DIAGNOSIS — D6869 Other thrombophilia: Secondary | ICD-10-CM | POA: Diagnosis not present

## 2023-02-07 DIAGNOSIS — R911 Solitary pulmonary nodule: Principal | ICD-10-CM | POA: Diagnosis present

## 2023-02-07 DIAGNOSIS — I480 Paroxysmal atrial fibrillation: Secondary | ICD-10-CM | POA: Diagnosis not present

## 2023-02-07 DIAGNOSIS — R6510 Systemic inflammatory response syndrome (SIRS) of non-infectious origin without acute organ dysfunction: Secondary | ICD-10-CM | POA: Diagnosis present

## 2023-02-07 DIAGNOSIS — E876 Hypokalemia: Secondary | ICD-10-CM | POA: Diagnosis present

## 2023-02-07 DIAGNOSIS — R7989 Other specified abnormal findings of blood chemistry: Secondary | ICD-10-CM | POA: Diagnosis not present

## 2023-02-07 DIAGNOSIS — R079 Chest pain, unspecified: Secondary | ICD-10-CM | POA: Diagnosis not present

## 2023-02-07 DIAGNOSIS — I4891 Unspecified atrial fibrillation: Secondary | ICD-10-CM | POA: Diagnosis present

## 2023-02-07 LAB — ECHOCARDIOGRAM COMPLETE
AR max vel: 3.26 cm2
AV Peak grad: 9.9 mmHg
Ao pk vel: 1.57 m/s
Area-P 1/2: 3.6 cm2
Height: 65 in
S' Lateral: 3.1 cm
Weight: 3976 oz

## 2023-02-07 LAB — RAPID URINE DRUG SCREEN, HOSP PERFORMED
Amphetamines: NOT DETECTED
Barbiturates: NOT DETECTED
Benzodiazepines: NOT DETECTED
Cocaine: NOT DETECTED
Opiates: NOT DETECTED
Tetrahydrocannabinol: NOT DETECTED

## 2023-02-07 LAB — URINALYSIS, COMPLETE (UACMP) WITH MICROSCOPIC
Bacteria, UA: NONE SEEN
Bilirubin Urine: NEGATIVE
Glucose, UA: NEGATIVE mg/dL
Hgb urine dipstick: NEGATIVE
Ketones, ur: NEGATIVE mg/dL
Leukocytes,Ua: NEGATIVE
Nitrite: NEGATIVE
Protein, ur: NEGATIVE mg/dL
Specific Gravity, Urine: >1.046 — ABNORMAL HIGH (ref 1.005–1.030)
pH: 6 (ref 5.0–8.0)

## 2023-02-07 LAB — CBC
HCT: 41.7 % (ref 39.0–52.0)
Hemoglobin: 13.2 g/dL (ref 13.0–17.0)
MCH: 29.3 pg (ref 26.0–34.0)
MCHC: 31.7 g/dL (ref 30.0–36.0)
MCV: 92.5 fL (ref 80.0–100.0)
Platelets: 219 10*3/uL (ref 150–400)
RBC: 4.51 MIL/uL (ref 4.22–5.81)
RDW: 14.2 % (ref 11.5–15.5)
WBC: 11.1 10*3/uL — ABNORMAL HIGH (ref 4.0–10.5)
nRBC: 0 % (ref 0.0–0.2)

## 2023-02-07 LAB — HEPARIN LEVEL (UNFRACTIONATED): Heparin Unfractionated: 0.39 IU/mL (ref 0.30–0.70)

## 2023-02-07 LAB — COMPREHENSIVE METABOLIC PANEL
ALT: 23 U/L (ref 0–44)
AST: 20 U/L (ref 15–41)
Albumin: 3.1 g/dL — ABNORMAL LOW (ref 3.5–5.0)
Alkaline Phosphatase: 65 U/L (ref 38–126)
Anion gap: 10 (ref 5–15)
BUN: 15 mg/dL (ref 8–23)
CO2: 22 mmol/L (ref 22–32)
Calcium: 7.9 mg/dL — ABNORMAL LOW (ref 8.9–10.3)
Chloride: 103 mmol/L (ref 98–111)
Creatinine, Ser: 1.12 mg/dL (ref 0.61–1.24)
GFR, Estimated: >60 mL/min (ref >60)
Glucose, Bld: 164 mg/dL — ABNORMAL HIGH (ref 70–99)
Potassium: 3.4 mmol/L — ABNORMAL LOW (ref 3.5–5.1)
Sodium: 135 mmol/L (ref 135–145)
Total Bilirubin: 0.6 mg/dL (ref 0.3–1.2)
Total Protein: 5.8 g/dL — ABNORMAL LOW (ref 6.5–8.1)

## 2023-02-07 LAB — PROTIME-INR
INR: 1 (ref 0.8–1.2)
Prothrombin Time: 13.5 seconds (ref 11.4–15.2)

## 2023-02-07 LAB — MAGNESIUM: Magnesium: 2 mg/dL (ref 1.7–2.4)

## 2023-02-07 LAB — TROPONIN I (HIGH SENSITIVITY): Troponin I (High Sensitivity): 26 ng/L — ABNORMAL HIGH (ref <18)

## 2023-02-07 LAB — BRAIN NATRIURETIC PEPTIDE: B Natriuretic Peptide: 20.2 pg/mL (ref 0.0–100.0)

## 2023-02-07 LAB — TSH: TSH: 1.451 u[IU]/mL (ref 0.350–4.500)

## 2023-02-07 MED ORDER — POTASSIUM CHLORIDE CRYS ER 20 MEQ PO TBCR
40.0000 meq | EXTENDED_RELEASE_TABLET | Freq: Once | ORAL | Status: AC
  Administered 2023-02-07: 40 meq via ORAL
  Filled 2023-02-07: qty 2

## 2023-02-07 MED ORDER — POTASSIUM CHLORIDE CRYS ER 20 MEQ PO TBCR: 40.0000 meq | EXTENDED_RELEASE_TABLET | Freq: Once | ORAL | Status: DC

## 2023-02-07 MED ORDER — APIXABAN 5 MG PO TABS
5.0000 mg | ORAL_TABLET | Freq: Two times a day (BID) | ORAL | 1 refills | Status: DC
  Filled 2023-02-07: qty 60, 30d supply, fill #0

## 2023-02-07 MED ORDER — METOPROLOL TARTRATE 25 MG PO TABS
12.5000 mg | ORAL_TABLET | Freq: Two times a day (BID) | ORAL | 1 refills | Status: DC
  Filled 2023-02-07: qty 60, 60d supply, fill #0

## 2023-02-07 MED ORDER — APIXABAN 5 MG PO TABS
5.0000 mg | ORAL_TABLET | Freq: Two times a day (BID) | ORAL | Status: DC
  Administered 2023-02-07: 5 mg via ORAL
  Filled 2023-02-07: qty 1

## 2023-02-07 MED ORDER — ORAL CARE MOUTH RINSE: 15.0000 mL | OROMUCOSAL | Status: DC | PRN

## 2023-02-07 NOTE — Consult Note (Addendum)
Cardiology Consultation   Patient ID: KEYNON THUL MRN: [id number]; DOB: 1956-03-31  Admit date: 02/06/2023 Date of Consult: 02/07/2023  PCP:  Shirline Frees, NP   [facility] Providers Cardiologist:  Meriam Sprague, MD        Patient Profile:   Cristian Gonzales is a 67 y.o. male with a hx of HLD, anxiety, depression, prior stroke, and diastolic dysfunction, who is being seen 02/07/2023 for the evaluation of a-fib RVR and chest pain at the request of Dr. Renford Dills.  History of Present Illness:  Cristian Gonzales is a 67 year old male with the above history. 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 Although his chart states he has a history of paroxysmal a-fib, there is no evidence to suggest this. Device checks from 04/01/21 to 05/23/22 were negative for a-fib. He reports having trouble with his phone in regards to device checks. Pt appears to be lost to cardiology follow up.   He presented to the ED on 02/06/23 with complaints of left sided chest pain and shortness of breath for the last day. After working in the yard all day Saturday, he noted increased fatigue, felt "washed out", denies any palpitations or fluttering. He developed chest pain Sunday evening. The chest pain was constant and not associated with exertion or inspiration. EKG in the ED showed a-flutter with HR 134. Labs were unremarkable except mildly elevated troponin at 24, potassium 3.2, calcium 8.5, and WBC 12.1. Chest CTA was negative for PE. Plan was to place on IV diltiazem but he converted to SR. He was placed on heparin IV and admitted to medicine.   Pt reports resolution of symptoms upon conversion to NSR about midnight last night. Pt reports that he does not have sleep apnea and if he did he "wouldn't use that machine anyway". He also reports cigar smoking.    Past Medical History:  Diagnosis Date   Anxiety    Arthritis    Cervical spine fracture (HCC)    13 -diving accident, no  neurolgic sequelae   Depression    Paroxysmal atrial fibrillation (HCC)    Stroke (HCC)    Syncope 11/27/2012   From Tramadol    Past Surgical History:  Procedure Laterality Date   BUBBLE STUDY  03/19/2021   Procedure: BUBBLE STUDY;  Surgeon: Wendall Stade, MD;  Location: [facility] ENDOSCOPY;  Service: Cardiovascular;;   CERVICAL DISCECTOMY  2004   dr Channing Mutters   KNEE SURGERY  1981   right   LOOP RECORDER INSERTION N/A 03/19/2021   Procedure: LOOP RECORDER INSERTION;  Surgeon: Lanier Prude, MD;  Location: [facility] INVASIVE CV LAB;  Service: Cardiovascular;  Laterality: N/A;   NECK SURGERY     REPLACEMENT TOTAL KNEE     right   REPLACEMENT TOTAL KNEE     TEE WITHOUT CARDIOVERSION N/A 03/19/2021   Procedure: TRANSESOPHAGEAL ECHOCARDIOGRAM (TEE);  Surgeon: Wendall Stade, MD;  Location: [facility] ENDOSCOPY;  Service: Cardiovascular;  Laterality: N/A;     Home Medications:  Prior to Admission medications   Medication Sig Start Date End Date Taking? Authorizing Provider  albuterol (VENTOLIN HFA) 108 (90 Base) MCG/ACT inhaler TAKE 2 PUFFS BY MOUTH EVERY 6 HOURS AS NEEDED FOR WHEEZE OR SHORTNESS OF BREATH Patient taking differently: Inhale 2 puffs into the lungs every 6 (six) hours as needed for wheezing or shortness of breath. 01/29/22  Yes Nafziger, Kandee Keen, NP  aspirin EC 81 MG EC tablet Take 1  tablet (81 mg total) by mouth daily. Swallow whole. Patient taking differently: Take 81 mg by mouth 2 (two) times a week. Swallow whole. 03/20/21  Yes Metzger-Cihelka, Desiree, NP  atorvastatin (LIPITOR) 80 MG tablet TAKE 1 TABLET EVERY DAY Patient taking differently: Take 80 mg by mouth 2 (two) times a week. 05/18/22  Yes Nafziger, Kandee Keen, NP  buPROPion (WELLBUTRIN XL) 150 MG 24 hr tablet Take 1 tablet (150 mg total) by mouth daily. 12/08/22  Yes Nafziger, Kandee Keen, NP  cyclobenzaprine (FLEXERIL) 10 MG tablet Take 1 tablet (10 mg total) by mouth at bedtime. Patient not taking: Reported on 02/06/2023 12/29/22   Shirline Frees, NP   tiZANidine (ZANAFLEX) 4 MG tablet Take 1 tablet (4 mg total) by mouth every 6 (six) hours as needed for muscle spasms. Patient not taking: Reported on 02/06/2023 01/25/23   Shirline Frees, NP    Inpatient Medications: Scheduled Meds:  apixaban  5 mg Oral BID   atorvastatin  80 mg Oral Daily   diltiazem  10 mg Intravenous Once   metoprolol tartrate  12.5 mg Oral BID   Continuous Infusions:  diltiazem (CARDIZEM) infusion     PRN Meds: acetaminophen **OR** acetaminophen, melatonin, nitroGLYCERIN, ondansetron (ZOFRAN) IV, mouth rinse  Allergies:    Allergies  Allergen Reactions   Tramadol Other (See Comments)    Passed out   Morphine Other (See Comments)    Left red bumps on the back and did not work   Trazodone And Nefazodone Other (See Comments)    Made the patient feel flushed,with blurred vision and muscle cramps     Social History:   Social History   Socioeconomic History   Marital status: Married    Spouse name: Not on file   Number of children: Not on file   Years of education: Not on file   Highest education level: Not on file  Occupational History   Not on file  Tobacco Use   Smoking status: Some Days    Types: Cigars    Last attempt to quit: 01/04/2010    Years since quitting: 13.1   Smokeless tobacco: Never   Tobacco comments:    Smokes 1 cigar per day  Substance and Sexual Activity   Alcohol use: No   Drug use: No   Sexual activity: Not on file  Other Topics Concern   Not on file  Social History Narrative   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    Married for 25 years    5 children ( 3 in [place] and 2 in [place])   Social Determinants of Health   Financial Resource Strain: Low Risk  (09/21/2022)   Overall Financial Resource Strain (CARDIA)    Difficulty of Paying Living Expenses: Not hard at all  Food Insecurity: No Food Insecurity (02/07/2023)   Hunger Vital Sign    Worried About Running Out of Food in the Last Year: Never true    Ran  Out of Food in the Last Year: Never true  Transportation Needs: No Transportation Needs (02/07/2023)   PRAPARE - Administrator, Civil Service (Medical): No    Lack of Transportation (Non-Medical): No  Physical Activity: Sufficiently Active (09/21/2022)   Exercise Vital Sign    Days of Exercise per Week: 5 days    Minutes of Exercise per Session: 30 min  Stress: No Stress Concern Present (09/21/2022)   [employer] of Occupational Health - Occupational Stress Questionnaire    Feeling of  Stress : Not at all  Social Connections: Moderately Isolated (09/21/2022)   Social Connection and Isolation Panel [NHANES]    Frequency of Communication with Friends and Family: More than three times a week    Frequency of Social Gatherings with Friends and Family: More than three times a week    Attends Religious Services: Never    Database administrator or Organizations: No    Attends Banker Meetings: Never    Marital Status: Married  Catering manager Violence: Not At Risk (09/21/2022)   Humiliation, Afraid, Rape, and Kick questionnaire    Fear of Current or Ex-Partner: No    Emotionally Abused: No    Physically Abused: No    Sexually Abused: No    Family History:    Family History  Problem Relation Age of Onset   Heart attack Father 35       Sudden Cardiac Death   Liver cancer Mother 76     ROS:  Please see the history of present illness.  All other ROS reviewed and negative.     Physical Exam/Data:   Vitals:   02/07/23 0020 02/07/23 0040 02/07/23 0247 02/07/23 0820  BP: 116/74 125/78 106/73 109/66  Pulse: 84 77 68 72  Resp: (!) 24 (!) 25 20 19   Temp:   98.4 F (36.9 C) 97.9 F (36.6 C)  TempSrc:   Oral Oral  SpO2: 100% 100% 97% 95%  Weight:   112.7 kg   Height:   5\' 5"  (1.651 m)     Intake/Output Summary (Last 24 hours) at 02/07/2023 1143 Last data filed at 02/07/2023 0800 Gross per 24 hour  Intake 1120.19 ml  Output 300 ml  Net 820.19 ml       02/07/2023    2:47 AM 02/06/2023    7:42 PM 01/25/2023    1:13 PM  Last 3 Weights  Weight (lbs) 248 lb 8 oz 248 lb 14.4 oz 249 lb  Weight (kg) 112.719 kg 112.9 kg 112.946 kg     Body mass index is 41.35 kg/m.  General:  Well nourished, well developed, in no acute distress HEENT: normal Neck: no JVD Cardiac:  normal S1, S2; RRR; no murmur  Lungs:  clear to auscultation bilaterally, no wheezing, rhonchi or rales  Abd: soft, nontender, no hepatomegaly  Ext: no edema Skin: warm and dry  Neuro:  CNs 2-12 intact, no focal abnormalities noted Psych:  Normal affect  EKG:  The EKG was personally reviewed and demonstrates:  Atrial flutter, HR 134 Telemetry:  Telemetry was personally reviewed and demonstrates:  NSR HR 50-60s  Relevant CV Studies: Echo pending  Laboratory Data:  High Sensitivity Troponin:   Recent Labs  Lab 02/06/23 1948 02/06/23 2118 02/07/23 0455  TROPONINIHS 14 24* 26*     Chemistry Recent Labs  Lab 02/06/23 1948 02/06/23 2118 02/07/23 0057  NA 135  --  135  K 3.2*  --  3.4*  CL 101  --  103  CO2 22  --  22  GLUCOSE 139*  --  164*  BUN 19  --  15  CREATININE 1.22  --  1.12  CALCIUM 8.5*  --  7.9*  MG  --  2.2 2.0  GFRNONAA >60  --  >60  ANIONGAP 12  --  10    Recent Labs  Lab 02/06/23 2118 02/07/23 0057  PROT 6.1* 5.8*  ALBUMIN 3.3* 3.1*  AST 32 20  ALT 20 23  ALKPHOS 69 65  BILITOT 1.6* 0.6   Hematology Recent Labs  Lab 02/06/23 1948 02/07/23 0057  WBC 12.1* 11.1*  RBC 5.07 4.51  HGB 14.8 13.2  HCT 45.2 41.7  MCV 89.2 92.5  MCH 29.2 29.3  MCHC 32.7 31.7  RDW 14.0 14.2  PLT 242 219   Thyroid  Recent Labs  Lab 02/06/23 2118  TSH 1.451    BNP Recent Labs  Lab 02/06/23 1948  BNP 20.2     Radiology/Studies:  CT Angio Chest Pulmonary Embolism (PE) W or WO Contrast  Result Date: 02/06/2023 CLINICAL DATA:  Pulmonary embolism suspected. High probability. Chest pain or shortness of breath with dizziness since yesterday. EXAM: CT  ANGIOGRAPHY CHEST WITH CONTRAST TECHNIQUE: Multidetector CT imaging of the chest was performed using the standard protocol during bolus administration of intravenous contrast. Multiplanar CT image reconstructions and MIPs were obtained to evaluate the vascular anatomy. RADIATION DOSE REDUCTION: This exam was performed according to the departmental dose-optimization program which includes automated exposure control, adjustment of the mA and/or kV according to patient size and/or use of iterative reconstruction technique. CONTRAST:  75mL OMNIPAQUE IOHEXOL 350 MG/ML SOLN COMPARISON:  PA and lateral chest today, PA and lateral chest 07/27/2022. No prior chest CT for comparison. FINDINGS: Cardiovascular: There is a loop recorder device implanted in the anterior reveal left chest wall. The heart is slightly enlarged, with left ventricular and septal wall hypertrophy. Scattered single-vessel coronary artery calcific plaques noted in the LAD. There is no pericardial effusion. Pulmonary veins are normal caliber. Respiratory motion obscures the subsegmental arterial bed in the lower lung fields. The pulmonary arteries are normal in caliber and otherwise without evidence of thromboembolism. There is scattered calcific plaque in the aorta and left subclavian artery. There is no aortic or great vessel aneurysm, dissection or stenosis. Mediastinum/Nodes: No enlarged mediastinal, hilar, or axillary lymph nodes. Thyroid gland, trachea, and esophagus demonstrate no significant findings. Lungs/Pleura: No pleural effusion, thickening or pneumothorax. Mild chronic elevation again noted right hemidiaphragm. The central airways are patent, but there is noted diffuse bronchial thickening, without bronchiectasis or visible bronchial plugging. The lungs are clear of infiltrates. There are scattered linear scar-like opacities in the bases. Also suspected scattered air trapping in the lower zones. There is a solitary well-circumscribed 9.1 x  5.9 mm ovoid pulmonary nodule in the right lower lobe, best seen on 6:67. There are no other visible nodules. Upper Abdomen: Moderate hepatic steatosis.  No acute abnormality. Musculoskeletal: Partially visible anterior fusion plating lower C-spine ending at C7. Multilevel bulky bridging enthesopathy right anterolateral lower T-spine with multilevel degenerative discs. No acute or other significant osseous findings or chest wall masses. Review of the MIP images confirms the above findings. IMPRESSION: 1. No evidence of arterial dilatation or embolus as far as visualized. Subsegmental lower zonal arteries are obscured by breathing motion. 2. Cardiomegaly with left ventricular and septal wall hypertrophy. 3. Aortic and LAD coronary artery atherosclerosis. 4. Bronchitis with scattered air trapping in the lower zones. 5. 9.1 x 5.9 mm right lower lobe pulmonary nodule. Consider one of the following in 3 months for both low-risk and high-risk individuals: (a) repeat chest CT, (b) follow-up PET-CT, or (c) tissue sampling. This recommendation follows the consensus statement: Guidelines for Management of Incidental Pulmonary Nodules Detected on CT Images: From the Fleischner Society 2017; Radiology 2017; 284:228-243. 6. Moderate hepatic steatosis. Electronically Signed   By: Almira Bar M.D.   On: 02/06/2023 21:50   DG Chest 2 View  Result Date: 02/06/2023 CLINICAL  DATA:  Chest pain EXAM: CHEST - 2 VIEW COMPARISON:  Chest x-ray 10/28/2015 FINDINGS: Loop recorder device is seen in the anterior left chest. The heart size and mediastinal contours are within normal limits. Both lungs are clear. Cervical spinal fusion plate is present. No acute fractures are seen. IMPRESSION: No active cardiopulmonary disease. Electronically Signed   By: Darliss Cheney M.D.   On: 02/06/2023 20:41     Assessment and Plan:   A-fib/flutter with RVR -- Initially presented with rates in the 140-150s -- Converted to NSR spontaneously about  midnight without IV diltiazem  -- transition to eliquis 5mg  from IV heparin  -- continue metoprolol 12.5 bid  -- pt reports poor sleep, about 2 hours a night. Denies any history of OSA, though reports he would not wear a machine if formally diagnosed.  -- will interrogate loop recorder to try and determine if any prior a-fib/flutter episodes  Chest Pain -- resolved with conversion to NSR -- Trop only mildly elevated -- will plan for coronary CT as outpatient to further risk stratify  Diastolic dysfunction -- last echo in 2022 showed LV EF 60-65%, with no regional wall abnormalities, grade 1 diastolic dysfunction.  -- no signs of overt volume overload - pending echo today   HLD -- pt reports Lipitor 80mg  gives him bad cramps and he only takes it once every two weeks.  -- would be a good candidate [id number], will refer to lipid clinic    Risk Assessment/Risk Scores:   CHA2DS2-VASc Score = 3 This indicates a 3.2% annual risk of stroke. The patient's score is based upon: CHF History: 0 HTN History: 0 Diabetes History: 0 Stroke History: 2 Vascular Disease History: 0 Age Score: 1 Gender Score: 0   For questions or updates, please contact [facility] Please consult [email or web address] for contact info under    Signed, Osborne Oman, RN Student Nurse Practitioner  02/07/2023 11:43 AM   Patient seen and examined and agree with Cristian Gonzales as detailed above.  In brief, the patient is a 67 year old male with history of HLD, anxiety, depression, and prior CVA who presented to the ER with SOB, irregular pulse and chest pain found to be in new Afib with RVR for which Cardiology was consulted.   Patient states that he began to feel unwell on Saturday after working in the yard and getting dehydrated. On Sunday, he developed chest discomfort when laying down and he checked his pulse and noted it was irregular prompting him to come to the ER.  In the ER, ECG showed Aflutter with HR 134.  Trop 14>24>26. He spontaneously converted back to NSR in the ER and has remained in NSR since arrival to the floor.  Currently, the patient is comfortable. No further chest pain since he has been back in rhythm, HR 50-60s. Remains on heparin gtt for [facility] with plans to transition to apixaban today.  GEN: No acute distress.   Neck: No JVD Cardiac: RRR, no murmurs, rubs, or gallops.  Respiratory: Clear to auscultation bilaterally. GI: Soft, nontender, non-distended  MS: No edema; No deformity. Neuro:  Nonfocal  Psych: Normal affect    Plan: -Change from heparin gtt to apixaban 5mg  BID -Continue metop 12.5mg  BID -Follow-up TTE -Agree with referral to lipid clinic as outpatient as is not tolerate statin therapy -Could consider coronary CTA as outpatient pending symptoms although suspect his chest discomfort was secondary to Afib with RVR given it resolved with restoration of NSR  Cristian Gonzales  Shari Prows, MD

## 2023-02-07 NOTE — TOC Benefit Eligibility Note (Signed)
Patient Advocate Encounter  Insurance verification completed.    The patient is currently admitted and upon discharge could be taking Eliquis 5 mg.  The current 30 day co-pay is $47.00.   The patient is currently admitted and upon discharge could be taking Xarelto 20 mg.  The current 30 day co-pay is $47.00.   The patient is insured through [employer] Medicare Part D   This test claim was processed through [facility]- copay amounts may vary at other pharmacies due to pharmacy/plan contracts, or as the patient moves through the different stages of their insurance plan.  Cristian Gonzales, CPHT Pharmacy Patient Advocate Specialist [facility] Patient Advocate Team Direct Number: [phone]  Fax: [phone]       

## 2023-02-07 NOTE — Care Management CC44 (Signed)
Condition Code 44 Documentation Completed  Patient Details  Name: Cristian Gonzales MRN: [id number] Date of Birth: 17-Apr-1956   Condition Code 44 given:  Yes Patient signature on Condition Code 44 notice:  Yes Documentation of 2 MD's agreement:  Yes Code 44 added to claim:  Yes    Gala Lewandowsky, RN 02/07/2023, 3:44 PM

## 2023-02-07 NOTE — Progress Notes (Addendum)
ANTICOAGULATION CONSULT NOTE   Pharmacy Consult for heparin Indication: chest pain/ACS  Allergies  Allergen Reactions   Tramadol Other (See Comments)    Passed out   Morphine Other (See Comments)    Left red bumps on the back and did not work   Trazodone And Nefazodone Other (See Comments)    Made the patient feel flushed,with blurred vision and muscle cramps     Patient Measurements: Height: 5\' 5"  (165.1 cm) Weight: 112.7 kg (248 lb 8 oz) IBW/kg (Calculated) : 61.5 Heparin Dosing Weight: 85.7 kg  Vital Signs: Temp: 98.4 F (36.9 C) (06/03 0247) Temp Source: Oral (06/03 0247) BP: 106/73 (06/03 0247) Pulse Rate: 68 (06/03 0247)  Labs: Recent Labs    02/06/23 1948 02/06/23 2118 02/07/23 0057 02/07/23 0455  HGB 14.8  --  13.2  --   HCT 45.2  --  41.7  --   PLT 242  --  219  --   LABPROT 13.5  --   --   --   INR 1.0  --   --   --   HEPARINUNFRC  --   --  0.39  --   CREATININE 1.22  --  1.12  --   TROPONINIHS 14 24*  --  26*     Estimated Creatinine Clearance: 74.2 mL/min (by C-G formula based on SCr of 1.12 mg/dL).   Medical History: Past Medical History:  Diagnosis Date   Anxiety    Arthritis    Cervical spine fracture (HCC)    13 -diving accident, no neurolgic sequelae   Depression    Paroxysmal atrial fibrillation (HCC)    Stroke (HCC)    Syncope 11/27/2012   From Tramadol    Medications:  Medications Prior to Admission  Medication Sig Dispense Refill Last Dose   albuterol (VENTOLIN HFA) 108 (90 Base) MCG/ACT inhaler TAKE 2 PUFFS BY MOUTH EVERY 6 HOURS AS NEEDED FOR WHEEZE OR SHORTNESS OF BREATH (Patient taking differently: Inhale 2 puffs into the lungs every 6 (six) hours as needed for wheezing or shortness of breath.) 18 each 0 unk   aspirin EC 81 MG EC tablet Take 1 tablet (81 mg total) by mouth daily. Swallow whole. (Patient taking differently: Take 81 mg by mouth 2 (two) times a week. Swallow whole.) 30 tablet 11 02/04/2023 at 1200   atorvastatin  (LIPITOR) 80 MG tablet TAKE 1 TABLET EVERY DAY (Patient taking differently: Take 80 mg by mouth 2 (two) times a week.) 90 tablet 4 02/04/2023   buPROPion (WELLBUTRIN XL) 150 MG 24 hr tablet Take 1 tablet (150 mg total) by mouth daily. 90 tablet 0 02/03/2023 at am   cyclobenzaprine (FLEXERIL) 10 MG tablet Take 1 tablet (10 mg total) by mouth at bedtime. (Patient not taking: Reported on 02/06/2023) 15 tablet 0 Not Taking   methylPREDNISolone (MEDROL DOSEPAK) 4 MG TBPK tablet Take as directed (Patient not taking: Reported on 02/06/2023) 21 tablet 0 Not Taking   tiZANidine (ZANAFLEX) 4 MG tablet Take 1 tablet (4 mg total) by mouth every 6 (six) hours as needed for muscle spasms. (Patient not taking: Reported on 02/06/2023) 15 tablet 0 Not Taking   Scheduled:   atorvastatin  80 mg Oral Daily   diltiazem  10 mg Intravenous Once   metoprolol tartrate  12.5 mg Oral BID    Assessment: 35 yoM presented to the ED with chief complaint of persistent left sided chest pain. Pharmacy has been consulted to dose heparin for ACS/STEMI.   -heparin  level at goal on 1050 units/hr  Goal of Therapy:  Heparin level 0.3-0.7 units/ml Monitor platelets by anticoagulation protocol: Yes   Plan:  -Continue heparin at 1050 units/hr -Will follow plans for oral anticoagulation -Recheck heparin level today if he remains on heparin  Harland German, PharmD Clinical Pharmacist **Pharmacist phone directory can now be found on [email or web address] ([email or web address]).  Listed under [facility].

## 2023-02-07 NOTE — Care Management Obs Status (Signed)
MEDICARE OBSERVATION STATUS NOTIFICATION   Patient Details  Name: Cristian Gonzales MRN: [id number] Date of Birth: 03/25/1956   Medicare Observation Status Notification Given:  Yes    Gala Lewandowsky, RN 02/07/2023, 3:44 PM
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
## 2023-02-07 NOTE — Progress Notes (Signed)
Echocardiogram 2D Echocardiogram has been performed.  Cristian Gonzales 02/07/2023, 1:29 PM

## 2023-02-07 NOTE — Discharge Summary (Signed)
Physician Discharge Summary  [name] VWU:[id number] DOB: 05-28-1956 DOA: 02/06/2023  PCP: Shirline Frees, NP  Admit date: 02/06/2023 Discharge date: 02/07/2023  Admitted From: Home Disposition:  Home  Discharge Condition:Stable CODE STATUS:FULL Diet recommendation: Heart Healthy   Brief/Interim Summary: Patient is a 67 year old male with history of paroxysmal A-fib not on anticoagulation, hyperlipidemia, depression, chronic diastolic CHF who presented with chest pain from home.  No report of shortness of breath.  Currently not on anticoagulation for A-fib and is on rate control strategy.  On presentation his heart rate was ranging in the 100s to 160.  Lab work showed potassium of 3.2, creatinine of 1.2.  Initial troponin was 14.  EKG showed A-fib with RVR, no ST changes .Started on Cardizem drip and heparin drip .  This Morning He Converted to Normal Sinus Rhythm.  Cardiology recommended to start on eliquis and  metoprolol.  Medically stable for discharge home today.  He will follow-up with cardiology as an outpatient.  Following problems were addressed during the hospitalization:  Chest pain: Unclear etiology, could be associated with A-fib with RVR.  Troponin not significantly elevated.  EKG did not show any ischemic changes.  CTA chest did not show any acute findings.  Echocardiogram showed EF of 60 to 65%, no regional wall motion abnormality. Chest pain has resolved this morning   A-fib with RVR: No history of A-fib .on presentation, had to be started on diltiazem drip and heparin drip in ED. This morning he is in normal sinus rhythm CHA2DS2-VASc score of  4, there is an indication for chronic anticoagulation for thromboembolic prophylaxis , he has history of stroke.  Cardiology consulted.  Started on Eliquis and metoprolol   Hypokalemia: Supplemented   Hyperlipidemia: On Lipitor   Right lower lobe pulm nodule: Measuring 9.1 x 5.9 mm on right lower lobe.  Recommend CT /PET CT as an  outpatient   Depression: On Wellbutrin   Chronic diastolic CHF: Echo as above.  Currently euvolemic   History of stroke: No residual deficits, takes aspirin at home.   Morbid obesity: BMI 41.3  Discharge Diagnoses:  Principal Problem:   Chest pain Active Problems:   Depression   Hyperlipidemia   Atrial fibrillation with RVR (HCC)   Elevated troponin   Hypokalemia   SIRS (systemic inflammatory response syndrome) (HCC)   Pulmonary nodule   Chronic diastolic CHF (congestive heart failure) [facility])    Discharge Instructions  Discharge Instructions     Diet - low sodium heart healthy   Complete by: As directed    Discharge instructions   Complete by: As directed    1)Please take prescribed medications as instructed 2)Follow up with your PCP in a week 2)You will be called by cardiology for follow up appointment   Increase activity slowly   Complete by: As directed       Allergies as of 02/07/2023       Reactions   Tramadol Other (See Comments)   Passed out   Morphine Other (See Comments)   Left red bumps on the back and did not work   Trazodone And Nefazodone Other (See Comments)   Made the patient feel flushed,with blurred vision and muscle cramps         Medication List     STOP taking these medications    tiZANidine 4 MG tablet Commonly known as: Zanaflex       TAKE these medications    albuterol 108 (90 Base) MCG/ACT inhaler Commonly known as:  VENTOLIN HFA TAKE 2 PUFFS BY MOUTH EVERY 6 HOURS AS NEEDED FOR WHEEZE OR SHORTNESS OF BREATH What changed: See the new instructions.   apixaban 5 MG Tabs tablet Commonly known as: ELIQUIS Take 1 tablet (5 mg total) by mouth 2 (two) times daily.   aspirin EC 81 MG tablet Take 1 tablet (81 mg total) by mouth daily. Swallow whole. What changed: when to take this   atorvastatin 80 MG tablet Commonly known as: LIPITOR TAKE 1 TABLET EVERY DAY What changed: when to take this   buPROPion 150 MG 24 hr  tablet Commonly known as: Wellbutrin XL Take 1 tablet (150 mg total) by mouth daily.   metoprolol tartrate 25 MG tablet Commonly known as: LOPRESSOR Take 0.5 tablets (12.5 mg total) by mouth 2 (two) times daily.        Follow-up Information     Connect with your PCP/Specialist as discussed. Schedule an appointment as soon as possible for a visit .   Contact information: [email or web address] Call our physician referral line at [phone].        Shirline Frees, NP. Schedule an appointment as soon as possible for a visit in 1 week(s).   Specialty: Family Medicine Contact information: [address] [place] [address] [phone]                Allergies  Allergen Reactions   Tramadol Other (See Comments)    Passed out   Morphine Other (See Comments)    Left red bumps on the back and did not work   Trazodone And Nefazodone Other (See Comments)    Made the patient feel flushed,with blurred vision and muscle cramps     Consultations: Cardiology   Procedures/Studies: ECHOCARDIOGRAM COMPLETE  Result Date: 02/07/2023    ECHOCARDIOGRAM REPORT   Patient Name:   Cristian Gonzales Date of Exam: 02/07/2023 Medical Rec #:  [id number]        Height:       65.0 in Accession #:    [id number]       Weight:       248.5 lb Date of Birth:  07/02/1956        BSA:          2.169 m Patient Age:    53 years         BP:           106/73 mmHg Patient Gender: M                HR:           62 bpm. Exam Location:  Inpatient Procedure: 2D Echo, Cardiac Doppler and Color Doppler Indications:    Elevated Troponin  History:        Patient has no prior history of Echocardiogram examinations,                 most recent 03/17/2021. CHF, Pacemaker, Stroke, Arrythmias:Atrial                 Fibrillation, Signs/Symptoms:Chest Pain and Syncope; Risk                 Factors:Dyslipidemia.  Sonographer:    Lucendia Herrlich Referring Phys: [id number] JUSTIN B HOWERTER  IMPRESSIONS  1. Left ventricular ejection fraction, by estimation, is 60 to 65%. The left ventricle has normal function. The left ventricle has no regional wall motion abnormalities. There is mild concentric left ventricular hypertrophy. Left ventricular diastolic parameters were normal.  2.  Right ventricular systolic function is normal. The right ventricular size is normal.  3. The mitral valve is normal in structure. Trivial mitral valve regurgitation. No evidence of mitral stenosis.  4. The aortic valve is tricuspid. There is mild calcification of the aortic valve. Aortic valve regurgitation is not visualized. No aortic stenosis is present.  5. The inferior vena cava is normal in size with greater than 50% respiratory variability, suggesting right atrial pressure of 3 mmHg. FINDINGS  Left Ventricle: Left ventricular ejection fraction, by estimation, is 60 to 65%. The left ventricle has normal function. The left ventricle has no regional wall motion abnormalities. The left ventricular internal cavity size was normal in size. There is  mild concentric left ventricular hypertrophy. Left ventricular diastolic parameters were normal. Right Ventricle: The right ventricular size is normal. No increase in right ventricular wall thickness. Right ventricular systolic function is normal. Left Atrium: Left atrial size was normal in size. Right Atrium: Right atrial size was normal in size. Pericardium: There is no evidence of pericardial effusion. Mitral Valve: The mitral valve is normal in structure. Trivial mitral valve regurgitation. No evidence of mitral valve stenosis. Tricuspid Valve: The tricuspid valve is normal in structure. Tricuspid valve regurgitation is trivial. No evidence of tricuspid stenosis. Aortic Valve: The aortic valve is tricuspid. There is mild calcification of the aortic valve. Aortic valve regurgitation is not visualized. No aortic stenosis is present. Aortic valve peak gradient measures 9.9 mmHg.  Pulmonic Valve: The pulmonic valve was normal in structure. Pulmonic valve regurgitation is not visualized. No evidence of pulmonic stenosis. Aorta: The aortic root is normal in size and structure. Venous: The inferior vena cava is normal in size with greater than 50% respiratory variability, suggesting right atrial pressure of 3 mmHg. IAS/Shunts: No atrial level shunt detected by color flow Doppler. Additional Comments: A device lead is visualized.  LEFT VENTRICLE PLAX 2D LVIDd:         4.20 cm   Diastology LVIDs:         3.10 cm   LV e' medial:   9.70 cm/s LV PW:         1.20 cm   LV E/e' medial: 8.1 LV IVS:        1.20 cm LVOT diam:     2.30 cm LV SV:         99 LV SV Index:   45 LVOT Area:     4.15 cm  RIGHT VENTRICLE             IVC RV S prime:     14.50 cm/s  IVC diam: 2.00 cm TAPSE (M-mode): 1.6 cm LEFT ATRIUM           Index        RIGHT ATRIUM           Index LA diam:      4.20 cm 1.94 cm/m   RA Area:     16.50 cm LA Vol (A2C): 69.8 ml 32.18 ml/m  RA Volume:   42.30 ml  19.50 ml/m LA Vol (A4C): 66.0 ml 30.42 ml/m  AORTIC VALVE AV Area (Vmax): 3.26 cm AV Vmax:        157.00 cm/s AV Peak Grad:   9.9 mmHg LVOT Vmax:      123.20 cm/s LVOT Vmean:     78.000 cm/s LVOT VTI:       0.237 m  AORTA Ao Root diam: 3.20 cm Ao Asc diam:  3.60 cm MITRAL  VALVE               TRICUSPID VALVE MV Area (PHT): 3.60 cm    TR Peak grad:   9.0 mmHg MV Decel Time: 211 msec    TR Vmax:        150.00 cm/s MV E velocity: 78.90 cm/s MV A velocity: 67.00 cm/s  SHUNTS MV E/A ratio:  1.18        Systemic VTI:  0.24 m                            Systemic Diam: 2.30 cm Arvilla Meres MD Electronically signed by Arvilla Meres MD Signature Date/Time: 02/07/2023/1:48:36 PM    Final    CT Angio Chest Pulmonary Embolism (PE) W or WO Contrast  Result Date: 02/06/2023 CLINICAL DATA:  Pulmonary embolism suspected. High probability. Chest pain or shortness of breath with dizziness since yesterday. EXAM: CT ANGIOGRAPHY CHEST WITH CONTRAST  TECHNIQUE: Multidetector CT imaging of the chest was performed using the standard protocol during bolus administration of intravenous contrast. Multiplanar CT image reconstructions and MIPs were obtained to evaluate the vascular anatomy. RADIATION DOSE REDUCTION: This exam was performed according to the departmental dose-optimization program which includes automated exposure control, adjustment of the mA and/or kV according to patient size and/or use of iterative reconstruction technique. CONTRAST:  75mL OMNIPAQUE IOHEXOL 350 MG/ML SOLN COMPARISON:  PA and lateral chest today, PA and lateral chest 07/27/2022. No prior chest CT for comparison. FINDINGS: Cardiovascular: There is a loop recorder device implanted in the anterior reveal left chest wall. The heart is slightly enlarged, with left ventricular and septal wall hypertrophy. Scattered single-vessel coronary artery calcific plaques noted in the LAD. There is no pericardial effusion. Pulmonary veins are normal caliber. Respiratory motion obscures the subsegmental arterial bed in the lower lung fields. The pulmonary arteries are normal in caliber and otherwise without evidence of thromboembolism. There is scattered calcific plaque in the aorta and left subclavian artery. There is no aortic or great vessel aneurysm, dissection or stenosis. Mediastinum/Nodes: No enlarged mediastinal, hilar, or axillary lymph nodes. Thyroid gland, trachea, and esophagus demonstrate no significant findings. Lungs/Pleura: No pleural effusion, thickening or pneumothorax. Mild chronic elevation again noted right hemidiaphragm. The central airways are patent, but there is noted diffuse bronchial thickening, without bronchiectasis or visible bronchial plugging. The lungs are clear of infiltrates. There are scattered linear scar-like opacities in the bases. Also suspected scattered air trapping in the lower zones. There is a solitary well-circumscribed 9.1 x 5.9 mm ovoid pulmonary nodule in  the right lower lobe, best seen on 6:67. There are no other visible nodules. Upper Abdomen: Moderate hepatic steatosis.  No acute abnormality. Musculoskeletal: Partially visible anterior fusion plating lower C-spine ending at C7. Multilevel bulky bridging enthesopathy right anterolateral lower T-spine with multilevel degenerative discs. No acute or other significant osseous findings or chest wall masses. Review of the MIP images confirms the above findings. IMPRESSION: 1. No evidence of arterial dilatation or embolus as far as visualized. Subsegmental lower zonal arteries are obscured by breathing motion. 2. Cardiomegaly with left ventricular and septal wall hypertrophy. 3. Aortic and LAD coronary artery atherosclerosis. 4. Bronchitis with scattered air trapping in the lower zones. 5. 9.1 x 5.9 mm right lower lobe pulmonary nodule. Consider one of the following in 3 months for both low-risk and high-risk individuals: (a) repeat chest CT, (b) follow-up PET-CT, or (c) tissue sampling. This recommendation follows the consensus  statement: Guidelines for Management of Incidental Pulmonary Nodules Detected on CT Images: From the Fleischner Society 2017; Radiology 2017; 284:228-243. 6. Moderate hepatic steatosis. Electronically Signed   By: Almira Bar M.D.   On: 02/06/2023 21:50   DG Chest 2 View  Result Date: 02/06/2023 CLINICAL DATA:  Chest pain EXAM: CHEST - 2 VIEW COMPARISON:  Chest x-ray 10/28/2015 FINDINGS: Loop recorder device is seen in the anterior left chest. The heart size and mediastinal contours are within normal limits. Both lungs are clear. Cervical spinal fusion plate is present. No acute fractures are seen. IMPRESSION: No active cardiopulmonary disease. Electronically Signed   By: Darliss Cheney M.D.   On: 02/06/2023 20:41      Subjective: Patient seen and examined at bedside this morning.  Please see my progress note from today for further details.  Medically stable for discharge  Discharge  Exam: Vitals:   02/07/23 0247 02/07/23 0820  BP: 106/73 109/66  Pulse: 68 72  Resp: 20 19  Temp: 98.4 F (36.9 C) 97.9 F (36.6 C)  SpO2: 97% 95%   Vitals:   02/07/23 0020 02/07/23 0040 02/07/23 0247 02/07/23 0820  BP: 116/74 125/78 106/73 109/66  Pulse: 84 77 68 72  Resp: (!) 24 (!) 25 20 19   Temp:   98.4 F (36.9 C) 97.9 F (36.6 C)  TempSrc:   Oral Oral  SpO2: 100% 100% 97% 95%  Weight:   112.7 kg   Height:   5\' 5"  (1.651 m)     General: Pt is alert, awake, not in acute distress Cardiovascular: RRR, S1/S2 +, no rubs, no gallops Respiratory: CTA bilaterally, no wheezing, no rhonchi Abdominal: Soft, NT, ND, bowel sounds + Extremities: no edema, no cyanosis    The results of significant diagnostics from this hospitalization (including imaging, microbiology, ancillary and laboratory) are listed below for reference.     Microbiology: No results found for this or any previous visit (from the past 240 hour(s)).   Labs: BNP (last 3 results) Recent Labs    02/06/23 1948  BNP 20.2   Basic Metabolic Panel: Recent Labs  Lab 02/06/23 1948 02/06/23 2118 02/07/23 0057  NA 135  --  135  K 3.2*  --  3.4*  CL 101  --  103  CO2 22  --  22  GLUCOSE 139*  --  164*  BUN 19  --  15  CREATININE 1.22  --  1.12  CALCIUM 8.5*  --  7.9*  MG  --  2.2 2.0   Liver Function Tests: Recent Labs  Lab 02/06/23 2118 02/07/23 0057  AST 32 20  ALT 20 23  ALKPHOS 69 65  BILITOT 1.6* 0.6  PROT 6.1* 5.8*  ALBUMIN 3.3* 3.1*   Recent Labs  Lab 02/06/23 2118  LIPASE 23   No results for input(s): "AMMONIA" in the last 168 hours. CBC: Recent Labs  Lab 02/06/23 1948 02/07/23 0057  WBC 12.1* 11.1*  HGB 14.8 13.2  HCT 45.2 41.7  MCV 89.2 92.5  PLT 242 219   Cardiac Enzymes: No results for input(s): "CKTOTAL", "CKMB", "CKMBINDEX", "TROPONINI" in the last 168 hours. BNP: Invalid input(s): "POCBNP" CBG: No results for input(s): "GLUCAP" in the last 168  hours. D-Dimer No results for input(s): "DDIMER" in the last 72 hours. Hgb A1c No results for input(s): "HGBA1C" in the last 72 hours. Lipid Profile No results for input(s): "CHOL", "HDL", "LDLCALC", "TRIG", "CHOLHDL", "LDLDIRECT" in the last 72 hours. Thyroid function studies Recent Labs  02/06/23 2118  TSH 1.451   Anemia work up No results for input(s): "VITAMINB12", "FOLATE", "FERRITIN", "TIBC", "IRON", "RETICCTPCT" in the last 72 hours. Urinalysis    Component Value Date/Time   COLORURINE STRAW (A) 02/07/2023 0002   APPEARANCEUR CLEAR 02/07/2023 0002   LABSPEC >1.046 (H) 02/07/2023 0002   PHURINE 6.0 02/07/2023 0002   GLUCOSEU NEGATIVE 02/07/2023 0002   HGBUR NEGATIVE 02/07/2023 0002   HGBUR negative 10/31/2009 0923   BILIRUBINUR NEGATIVE 02/07/2023 0002   BILIRUBINUR n 07/25/2015 0942   KETONESUR NEGATIVE 02/07/2023 0002   PROTEINUR NEGATIVE 02/07/2023 0002   UROBILINOGEN 0.2 07/25/2015 0942   UROBILINOGEN 0.2 01/26/2010 1938   NITRITE NEGATIVE 02/07/2023 0002   LEUKOCYTESUR NEGATIVE 02/07/2023 0002   Sepsis Labs Recent Labs  Lab 02/06/23 1948 02/07/23 0057  WBC 12.1* 11.1*   Microbiology No results found for this or any previous visit (from the past 240 hour(s)).  Please note: You were cared for by a hospitalist during your hospital stay. Once you are discharged, your primary care physician will handle any further medical issues. Please note that NO REFILLS for any discharge medications will be authorized once you are discharged, as it is imperative that you return to your primary care physician (or establish a relationship with a primary care physician if you do not have one) for your post hospital discharge needs so that they can reassess your need for medications and monitor your lab values.    Time coordinating discharge: 40 minutes  SIGNED:   Burnadette Pop, MD  [facility] 02/07/2023, 2:37 PM Pager [phone]  If 7PM-7AM, please contact  night-coverage [email or web address] Password [email or web address]

## 2023-02-07 NOTE — ED Notes (Signed)
Per Dr. Arlean Hopping hold Cardizem at this time.

## 2023-02-07 NOTE — Progress Notes (Signed)
PROGRESS NOTE  Cristian Gonzales  ZOX:[id number] DOB: [birth year] DOA: 02/06/2023 PCP: Shirline Frees, NP   Brief Narrative: Patient is a 67 year old male with history of paroxysmal A-fib not on anticoagulation, hyperlipidemia, depression, chronic diastolic CHF who presented with chest pain from home.  No report of shortness of breath.  Currently not on anticoagulation for A-fib and is on rate control strategy.  On presentation his heart rate was ranging in the 100s to 160.  Lab work showed potassium of 3.2, creatinine of 1.2.  Initial troponin was 14.  EKG showed A-fib with RVR, no ST changes Case was discussed with cardiology fellow, started on Cardizem drip and heparin drip  Assessment & Plan:  Principal Problem:   Chest pain Active Problems:   Depression   Hyperlipidemia   Atrial fibrillation with RVR (HCC)   Elevated troponin   Hypokalemia   SIRS (systemic inflammatory response syndrome) (HCC)   Pulmonary nodule   Chronic diastolic CHF (congestive heart failure) (HCC)  Chest pain: Unclear etiology, could be associated with A-fib with RVR.  Troponin not significantly elevated.  EKG did not show any ischemic changes.  CTA chest did not show any acute findings.  Echocardiogram ordered. Chest pain has resolved this morning  A-fib with RVR: No history of A-fib .on presentation, had to be started on diltiazem drip and heparin drip in ED. This morning he is in normal sinus rhythm CHA2DS2-VASc score of  4, there is an indication for chronic anticoagulation for thromboembolic prophylaxis , he has history of stroke.  Cardiology consulted  Hypokalemia: Supplemented  Hyperlipidemia: On Lipitor  Right lower lobe pulm nodule: Measuring 9.1 x 5.9 mm on right lower lobe.  Recommend CT /PET CT as an outpatient  Depression: On Wellbutrin  Chronic diastolic CHF: Last echo in July 2022 showed EF of 60%, grade 1 diastolic dysfunction.  Currently euvolemic  History of stroke: No residual deficits,  takes aspirin at home.  Morbid obesity: BMI 41.3  DVT prophylaxis:     Code Status: Full Code  Family Communication: Wife at bedside  Patient status: Inpatient  Patient is from : Home  Anticipated discharge to: Home  Estimated DC date: Tomorrow   Consultants: Cardiology  Procedures: None  Antimicrobials:  Anti-infectives (From admission, onward)    None       Subjective: Patient seen and examined at bedside today.  Very comfortable, lying in bed.  Wife at bedside.  EKG monitor showed normal sinus rhythm.  Rate is controlled.  Denies any chest pain or shortness of breath.  Long discussion held with the patient and family about management plan  Objective: Vitals:   02/07/23 0020 02/07/23 0040 02/07/23 0247 02/07/23 0820  BP: 116/74 125/78 106/73 109/66  Pulse: 84 77 68 72  Resp: (!) 24 (!) 25 20   Temp:   98.4 F (36.9 C)   TempSrc:   Oral   SpO2: 100% 100% 97%   Weight:   112.7 kg   Height:   5\' 5"  (1.651 m)     Intake/Output Summary (Last 24 hours) at 02/07/2023 0825 Last data filed at 02/07/2023 0500 Gross per 24 hour  Intake 1070.19 ml  Output 300 ml  Net 770.19 ml   Filed Weights   02/06/23 1942 02/07/23 0247  Weight: 112.9 kg 112.7 kg    Examination:  General exam: Overall comfortable, not in distress, morbidly obese HEENT: PERRL Respiratory system:  no wheezes or crackles  Cardiovascular system: S1 & S2 heard, RRR.  Gastrointestinal  system: Abdomen is nondistended, soft and nontender. Central nervous system: Alert and oriented Extremities: No edema, no clubbing ,no cyanosis Skin: No rashes, no ulcers,no icterus     Data Reviewed: I have personally reviewed following labs and imaging studies  CBC: Recent Labs  Lab 02/06/23 1948 02/07/23 0057  WBC 12.1* 11.1*  HGB 14.8 13.2  HCT 45.2 41.7  MCV 89.2 92.5  PLT 242 219   Basic Metabolic Panel: Recent Labs  Lab 02/06/23 1948 02/06/23 2118 02/07/23 0057  NA 135  --  135  K 3.2*   --  3.4*  CL 101  --  103  CO2 22  --  22  GLUCOSE 139*  --  164*  BUN 19  --  15  CREATININE 1.22  --  1.12  CALCIUM 8.5*  --  7.9*  MG  --  2.2 2.0     No results found for this or any previous visit (from the past 240 hour(s)).   Radiology Studies: CT Angio Chest Pulmonary Embolism (PE) W or WO Contrast  Result Date: 02/06/2023 CLINICAL DATA:  Pulmonary embolism suspected. High probability. Chest pain or shortness of breath with dizziness since yesterday. EXAM: CT ANGIOGRAPHY CHEST WITH CONTRAST TECHNIQUE: Multidetector CT imaging of the chest was performed using the standard protocol during bolus administration of intravenous contrast. Multiplanar CT image reconstructions and MIPs were obtained to evaluate the vascular anatomy. RADIATION DOSE REDUCTION: This exam was performed according to the departmental dose-optimization program which includes automated exposure control, adjustment of the mA and/or kV according to patient size and/or use of iterative reconstruction technique. CONTRAST:  75mL OMNIPAQUE IOHEXOL 350 MG/ML SOLN COMPARISON:  PA and lateral chest today, PA and lateral chest 07/27/2022. No prior chest CT for comparison. FINDINGS: Cardiovascular: There is a loop recorder device implanted in the anterior reveal left chest wall. The heart is slightly enlarged, with left ventricular and septal wall hypertrophy. Scattered single-vessel coronary artery calcific plaques noted in the LAD. There is no pericardial effusion. Pulmonary veins are normal caliber. Respiratory motion obscures the subsegmental arterial bed in the lower lung fields. The pulmonary arteries are normal in caliber and otherwise without evidence of thromboembolism. There is scattered calcific plaque in the aorta and left subclavian artery. There is no aortic or great vessel aneurysm, dissection or stenosis. Mediastinum/Nodes: No enlarged mediastinal, hilar, or axillary lymph nodes. Thyroid gland, trachea, and esophagus  demonstrate no significant findings. Lungs/Pleura: No pleural effusion, thickening or pneumothorax. Mild chronic elevation again noted right hemidiaphragm. The central airways are patent, but there is noted diffuse bronchial thickening, without bronchiectasis or visible bronchial plugging. The lungs are clear of infiltrates. There are scattered linear scar-like opacities in the bases. Also suspected scattered air trapping in the lower zones. There is a solitary well-circumscribed 9.1 x 5.9 mm ovoid pulmonary nodule in the right lower lobe, best seen on 6:67. There are no other visible nodules. Upper Abdomen: Moderate hepatic steatosis.  No acute abnormality. Musculoskeletal: Partially visible anterior fusion plating lower C-spine ending at C7. Multilevel bulky bridging enthesopathy right anterolateral lower T-spine with multilevel degenerative discs. No acute or other significant osseous findings or chest wall masses. Review of the MIP images confirms the above findings. IMPRESSION: 1. No evidence of arterial dilatation or embolus as far as visualized. Subsegmental lower zonal arteries are obscured by breathing motion. 2. Cardiomegaly with left ventricular and septal wall hypertrophy. 3. Aortic and LAD coronary artery atherosclerosis. 4. Bronchitis with scattered air trapping in the lower zones.  5. 9.1 x 5.9 mm right lower lobe pulmonary nodule. Consider one of the following in 3 months for both low-risk and high-risk individuals: (a) repeat chest CT, (b) follow-up PET-CT, or (c) tissue sampling. This recommendation follows the consensus statement: Guidelines for Management of Incidental Pulmonary Nodules Detected on CT Images: From the Fleischner Society 2017; Radiology 2017; 284:228-243. 6. Moderate hepatic steatosis. Electronically Signed   By: Almira Bar M.D.   On: 02/06/2023 21:50   DG Chest 2 View  Result Date: 02/06/2023 CLINICAL DATA:  Chest pain EXAM: CHEST - 2 VIEW COMPARISON:  Chest x-ray  10/28/2015 FINDINGS: Loop recorder device is seen in the anterior left chest. The heart size and mediastinal contours are within normal limits. Both lungs are clear. Cervical spinal fusion plate is present. No acute fractures are seen. IMPRESSION: No active cardiopulmonary disease. Electronically Signed   By: Darliss Cheney M.D.   On: 02/06/2023 20:41    Scheduled Meds:  atorvastatin  80 mg Oral Daily   diltiazem  10 mg Intravenous Once   metoprolol tartrate  12.5 mg Oral BID   Continuous Infusions:  diltiazem (CARDIZEM) infusion     heparin 1,050 Units/hr (02/07/23 0416)     LOS: 1 day   Burnadette Pop, MD [facility] P6/11/2022, 8:25 AM

## 2023-02-18 ENCOUNTER — Emergency Department (HOSPITAL_COMMUNITY): Payer: Medicare PPO

## 2023-02-18 ENCOUNTER — Encounter (HOSPITAL_COMMUNITY): Payer: Self-pay | Admitting: Emergency Medicine

## 2023-02-18 ENCOUNTER — Emergency Department (HOSPITAL_COMMUNITY)
Admission: EM | Admit: 2023-02-18 | Discharge: 2023-02-18 | Disposition: A | Discharge status: Home / Self Care | Payer: Medicare PPO | Attending: Emergency Medicine | Admitting: Emergency Medicine

## 2023-02-18 DIAGNOSIS — I480 Paroxysmal atrial fibrillation: Secondary | ICD-10-CM | POA: Diagnosis not present

## 2023-02-18 DIAGNOSIS — R200 Anesthesia of skin: Secondary | ICD-10-CM | POA: Insufficient documentation

## 2023-02-18 DIAGNOSIS — N200 Calculus of kidney: Secondary | ICD-10-CM | POA: Diagnosis not present

## 2023-02-18 DIAGNOSIS — R1032 Left lower quadrant pain: Secondary | ICD-10-CM | POA: Insufficient documentation

## 2023-02-18 DIAGNOSIS — I861 Scrotal varices: Secondary | ICD-10-CM | POA: Insufficient documentation

## 2023-02-18 DIAGNOSIS — R930 Abnormal findings on diagnostic imaging of skull and head, not elsewhere classified: Secondary | ICD-10-CM | POA: Diagnosis not present

## 2023-02-18 DIAGNOSIS — F17210 Nicotine dependence, cigarettes, uncomplicated: Secondary | ICD-10-CM | POA: Insufficient documentation

## 2023-02-18 DIAGNOSIS — Z7982 Long term (current) use of aspirin: Secondary | ICD-10-CM | POA: Diagnosis not present

## 2023-02-18 DIAGNOSIS — R109 Unspecified abdominal pain: Secondary | ICD-10-CM | POA: Diagnosis not present

## 2023-02-18 DIAGNOSIS — N3289 Other specified disorders of bladder: Secondary | ICD-10-CM | POA: Diagnosis not present

## 2023-02-18 DIAGNOSIS — R202 Paresthesia of skin: Secondary | ICD-10-CM | POA: Diagnosis not present

## 2023-02-18 DIAGNOSIS — M48061 Spinal stenosis, lumbar region without neurogenic claudication: Secondary | ICD-10-CM

## 2023-02-18 DIAGNOSIS — Z7901 Long term (current) use of anticoagulants: Secondary | ICD-10-CM | POA: Insufficient documentation

## 2023-02-18 DIAGNOSIS — M546 Pain in thoracic spine: Secondary | ICD-10-CM | POA: Insufficient documentation

## 2023-02-18 DIAGNOSIS — N50812 Left testicular pain: Secondary | ICD-10-CM | POA: Diagnosis present

## 2023-02-18 DIAGNOSIS — N433 Hydrocele, unspecified: Secondary | ICD-10-CM | POA: Diagnosis not present

## 2023-02-18 DIAGNOSIS — M545 Low back pain, unspecified: Secondary | ICD-10-CM | POA: Diagnosis not present

## 2023-02-18 LAB — CBC
HCT: 41.9 % (ref 39.0–52.0)
Hemoglobin: 13.3 g/dL (ref 13.0–17.0)
MCH: 29.9 pg (ref 26.0–34.0)
MCHC: 31.7 g/dL (ref 30.0–36.0)
MCV: 94.2 fL (ref 80.0–100.0)
Platelets: 302 10*3/uL (ref 150–400)
RBC: 4.45 MIL/uL (ref 4.22–5.81)
RDW: 13.9 % (ref 11.5–15.5)
WBC: 9.3 10*3/uL (ref 4.0–10.5)
nRBC: 0 % (ref 0.0–0.2)

## 2023-02-18 LAB — URINALYSIS, ROUTINE W REFLEX MICROSCOPIC
Bilirubin Urine: NEGATIVE
Glucose, UA: NEGATIVE mg/dL
Hgb urine dipstick: NEGATIVE
Ketones, ur: NEGATIVE mg/dL
Leukocytes,Ua: NEGATIVE
Nitrite: NEGATIVE
Protein, ur: NEGATIVE mg/dL
Specific Gravity, Urine: >1.03 — ABNORMAL HIGH (ref 1.005–1.030)
pH: 5.5 (ref 5.0–8.0)

## 2023-02-18 LAB — BASIC METABOLIC PANEL
Anion gap: 8 (ref 5–15)
BUN: 23 mg/dL (ref 8–23)
CO2: 23 mmol/L (ref 22–32)
Calcium: 8.6 mg/dL — ABNORMAL LOW (ref 8.9–10.3)
Chloride: 105 mmol/L (ref 98–111)
Creatinine, Ser: 1.09 mg/dL (ref 0.61–1.24)
GFR, Estimated: >60 mL/min (ref >60)
Glucose, Bld: 123 mg/dL — ABNORMAL HIGH (ref 70–99)
Potassium: 3.9 mmol/L (ref 3.5–5.1)
Sodium: 136 mmol/L (ref 135–145)

## 2023-02-18 MED ORDER — DEXAMETHASONE SODIUM PHOSPHATE 10 MG/ML IJ SOLN
10.0000 mg | Freq: Once | INTRAMUSCULAR | Status: AC
  Administered 2023-02-18: 10 mg via INTRAVENOUS
  Filled 2023-02-18: qty 1

## 2023-02-18 MED ORDER — OXYCODONE-ACETAMINOPHEN 5-325 MG PO TABS
1.0000 | ORAL_TABLET | Freq: Once | ORAL | Status: AC
  Administered 2023-02-18: 1 via ORAL
  Filled 2023-02-18: qty 1

## 2023-02-18 MED ORDER — IOHEXOL 350 MG/ML SOLN
75.0000 mL | Freq: Once | INTRAVENOUS | Status: AC | PRN
  Administered 2023-02-18: 75 mL via INTRAVENOUS

## 2023-02-18 MED ORDER — KETOROLAC TROMETHAMINE 15 MG/ML IJ SOLN
15.0000 mg | Freq: Once | INTRAMUSCULAR | Status: AC
  Administered 2023-02-18: 15 mg via INTRAVENOUS
  Filled 2023-02-18: qty 1

## 2023-02-18 MED ORDER — METHYLPREDNISOLONE 4 MG PO TBPK: ORAL_TABLET | ORAL | 0 refills | Status: DC

## 2023-02-18 MED ORDER — OXYCODONE-ACETAMINOPHEN 5-325 MG PO TABS: 1.0000 | ORAL_TABLET | Freq: Four times a day (QID) | ORAL | 0 refills | Status: DC | PRN

## 2023-02-18 NOTE — ED Provider Notes (Signed)
Care transferred from Sherian Maroon, PA-C at time of sign out. See their note for full assessment.   Briefly: Patient is 67 y.o. male with history of paroxysmal atrial fibrillation on Eliquis, CVA, C-spine fracture, left-sided thoracic back pain who presents to the ED with left sided numbness x Monday. No known injury.    Plan: Plan per previous PA-C: pending MRI and neurosurgery consult pending MRI results.  Labs Reviewed  URINALYSIS, ROUTINE W REFLEX MICROSCOPIC - Abnormal; Notable for the following components:      Result Value   Specific Gravity, Urine >1.030 (*)    All other components within normal limits  BASIC METABOLIC PANEL - Abnormal; Notable for the following components:   Glucose, Bld 123 (*)    Calcium 8.6 (*)    All other components within normal limits  CBC    MR LUMBAR SPINE WO CONTRAST  Result Date: 02/18/2023 CLINICAL DATA:  Mid back pain. EXAM: MRI THORACIC AND LUMBAR SPINE WITHOUT CONTRAST TECHNIQUE: Multiplanar and multiecho pulse sequences of the thoracic and lumbar spine were obtained without intravenous contrast. COMPARISON:  None Available. FINDINGS: MRI THORACIC SPINE FINDINGS Alignment:  Normal. Vertebrae: No fracture, evidence of discitis, or bone lesion. Cord: Compression of the spinal cord at the T11-12 level with associated increased T2 signal, likely edema. Paraspinal and other soft tissues: Unremarkable. 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 Disc bulge and moderate bilateral facet arthropathy results in mild spinal canal stenosis and moderate left neural foraminal narrowing at T10-11. Disc bulge and severe bilateral facet arthropathy results in severe spinal canal stenosis with spinal cord compression and associated edema, as well as moderate left neural foraminal narrowing at T11-12. MRI LUMBAR SPINE FINDINGS Segmentation:  Standard. Alignment:  Normal. 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  Conus medullaris and cauda equina: Conus extends to the L2 level. Conus and cauda equina appear normal. Paraspinal and other soft tissues: Mild fatty atrophy of the paraspinal muscles. Disc levels: T12-L1: No disc herniation, spinal canal stenosis or neural foraminal narrowing. Moderate bilateral facet arthropathy. L1-L2: Disc bulge and moderate bilateral facet arthropathy results in mild spinal canal stenosis. Superimposed left foraminal disc protrusion results in severe left neural foraminal narrowing. L2-L3: Complex fluid signal structure in the left lateral aspect of the spinal canal, apparently arising from the either the right or left L2-3 facet joint (sagittal images 7 and 11, series 9), measuring up to 24 x 9 mm in the sagittal plane (sagittal image 9 series 8 and image 10 series 9), wrapping along the ventral aspect of the spinal canal favored to represent a large synovial cyst. Combined with disc bulge and moderate bilateral facet arthropathy this results in moderate-to-severe spinal canal stenosis. L3-L4: Disc bulge and severe bilateral facet arthropathy with joint effusions results in severe spinal canal stenosis. L4-L5: Left eccentric disc bulge and severe bilateral facet arthropathy with joint effusions results in severe spinal canal stenosis and severe left neural foraminal narrowing. L5-S1: Disc bulge and severe left-greater-than-right facet arthropathy results in severe left and moderate right neural foraminal narrowing. No spinal canal stenosis. IMPRESSION: 1. 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 Moderate left neural foraminal narrowing at this level. 2. Complex fluid signal structure in the left lateral aspect of the spinal canal at L2-3, apparently arising from either the right or left L2-3 facet joint, favored to represent a large synovial cyst. Combined with  disc bulge and facet  arthropathy this results in moderate-to-severe spinal canal stenosis at this level. 3. Severe spinal canal stenosis at L3-4 and L4-5. 4. Mild spinal canal stenosis and moderate left neural foraminal narrowing at T10-11. 5. Multilevel moderate to severe lumbar neural foraminal narrowing, as described above. Electronically Signed   By: Orvan Falconer M.D.   On: 02/18/2023 08:49   MR THORACIC SPINE WO CONTRAST  Result Date: 02/18/2023 CLINICAL DATA:  Mid back pain. EXAM: MRI THORACIC AND LUMBAR SPINE WITHOUT CONTRAST TECHNIQUE: Multiplanar and multiecho pulse sequences of the thoracic and lumbar spine were obtained without intravenous contrast. COMPARISON:  None Available. FINDINGS: MRI THORACIC SPINE FINDINGS Alignment:  Normal. Vertebrae: No fracture, evidence of discitis, or bone lesion. Cord: Compression of the spinal cord at the T11-12 level with associated increased T2 signal, likely edema. Paraspinal and other soft tissues: Unremarkable. 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 Disc bulge and moderate bilateral facet arthropathy results in mild spinal canal stenosis and moderate left neural foraminal narrowing at T10-11. Disc bulge and severe bilateral facet arthropathy results in severe spinal canal stenosis with spinal cord compression and associated edema, as well as moderate left neural foraminal narrowing at T11-12. MRI LUMBAR SPINE FINDINGS Segmentation:  Standard. Alignment:  Normal. 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 Conus medullaris and cauda equina: Conus extends to the L2 level. Conus and cauda equina appear normal. Paraspinal and other soft tissues: Mild fatty atrophy of the paraspinal muscles. Disc levels: T12-L1: No disc herniation, spinal canal stenosis or neural foraminal narrowing. Moderate bilateral facet arthropathy. L1-L2: Disc bulge and moderate bilateral facet arthropathy results in mild spinal canal stenosis.  Superimposed left foraminal disc protrusion results in severe left neural foraminal narrowing. L2-L3: Complex fluid signal structure in the left lateral aspect of the spinal canal, apparently arising from the either the right or left L2-3 facet joint (sagittal images 7 and 11, series 9), measuring up to 24 x 9 mm in the sagittal plane (sagittal image 9 series 8 and image 10 series 9), wrapping along the ventral aspect of the spinal canal favored to represent a large synovial cyst. Combined with disc bulge and moderate bilateral facet arthropathy this results in moderate-to-severe spinal canal stenosis. L3-L4: Disc bulge and severe bilateral facet arthropathy with joint effusions results in severe spinal canal stenosis. L4-L5: Left eccentric disc bulge and severe bilateral facet arthropathy with joint effusions results in severe spinal canal stenosis and severe left neural foraminal narrowing. L5-S1: Disc bulge and severe left-greater-than-right facet arthropathy results in severe left and moderate right neural foraminal narrowing. No spinal canal stenosis. IMPRESSION: 1. 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 Moderate left neural foraminal narrowing at this level. 2. Complex fluid signal structure in the left lateral aspect of the spinal canal at L2-3, apparently arising from either the right or left L2-3 facet joint, favored to represent a large synovial cyst. Combined with disc bulge and facet arthropathy this results in moderate-to-severe spinal canal stenosis at this level. 3. Severe spinal canal stenosis at L3-4 and L4-5. 4. Mild spinal canal stenosis and moderate left neural foraminal narrowing at T10-11. 5. Multilevel moderate to severe lumbar neural foraminal narrowing, as described above. Electronically Signed   By: Orvan Falconer M.D.   On: 02/18/2023 08:49   CT L-SPINE NO CHARGE  Result Date:  02/18/2023 CLINICAL DATA:  67 year old male with  left flank pain. EXAM: CT LUMBAR SPINE WITH CONTRAST TECHNIQUE: Technique: Multiplanar CT images of the lumbar spine were reconstructed from contemporary CT of the Abdomen and Pelvis. RADIATION DOSE REDUCTION: This exam was performed according to the departmental dose-optimization program which includes automated exposure control, adjustment of the mA and/or kV according to patient size and/or use of iterative reconstruction technique. CONTRAST:  No additional COMPARISON:  CT Abdomen and Pelvis today reported separately. FINDINGS: Segmentation: Normal. Alignment: Relatively normal lumbar lordosis. No significant spondylolisthesis or scoliosis. Vertebrae: Bone mineralization is within normal limits. Maintained vertebral body height. No acute osseous abnormality identified. Intact visible sacrum and SI joints. Paraspinal and other soft tissues: Abdomen and pelvis are detailed separately. Lumbar paraspinal soft tissues are within normal limits. 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 The following superimposed degenerative changes are noted: T10-T11: Disc space loss with mostly anterior disc osteophyte complex. Severe right T10 foraminal stenosis. T11-T12: Disc space loss. Circumferential disc osteophyte complex and calcified ligament flavum hypertrophy on the left (series 1, image 22). At least moderate overall spinal stenosis here at the level of the lower thoracic spinal cord. Also moderate to severe T11 foraminal stenosis greater on the right. T12-L1:  Better maintained disc space.  Mild disc bulging. L1-L2: Maintained disc space. Mild disc bulging. Mild facet and ligament flavum hypertrophy. L2-L3: Vacuum disc. Circumferential disc osteophyte complex with mild to moderate facet and ligament flavum hypertrophy. Mild to moderate overall spinal  stenosis here. Moderate to severe bilateral L2 foraminal stenosis. L3-L4: Maintained disc height but circumferential disc bulge and moderate to severe facet and ligament flavum hypertrophy. Vacuum facet. Severe spinal stenosis suspected. Moderate to severe bilateral L3 foraminal stenosis. L4-L5: Vacuum disc. Severe facet hypertrophy, bulky heterotopic ossification on the right and vacuum facet. Circumferential disc bulge. Moderate to severe spinal stenosis. Severe bilateral L4 foraminal stenosis. L5-S1: Vacuum disc and disc space loss. Severe facet hypertrophy and vacuum facet greater on the left. Moderate to severe bilateral L5 foraminal stenosis. IMPRESSION: 1. No acute osseous abnormality in the lumbar spine. 2. 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 And Severe lower lumbar facet arthropathy L3-L4 through L5-S1. 3. Subsequent suspected moderate or severe spinal stenosis: T11-T12, L2-L3, L3-L4 (severe), L4-L5 (moderate to severe). Associated moderate to severe neural foraminal stenosis at the right T10, bilateral T11, bilateral L2, L3, L4, and L5 nerve levels. 4.  CT Abdomen and Pelvis today reported separately. Electronically Signed   By: Odessa Fleming M.D.   On: 02/18/2023 06:20   CT ABDOMEN PELVIS W CONTRAST  Result Date: 02/18/2023 CLINICAL DATA:  67 year old male with left flank pain. EXAM: CT ABDOMEN AND PELVIS WITH CONTRAST TECHNIQUE: Multidetector CT imaging of the abdomen and pelvis was performed using the standard protocol following bolus administration of intravenous contrast. RADIATION DOSE REDUCTION: This exam was performed according to the departmental dose-optimization program which includes automated exposure control, adjustment of the mA and/or kV according to patient size and/or use of iterative reconstruction technique. CONTRAST:  75mL OMNIPAQUE IOHEXOL 350 MG/ML SOLN COMPARISON:  Lumbar spine CT reported  separately. Scrotal ultrasound with Doppler earlier this morning. CTA chest 02/06/2023. FINDINGS: Lower chest: Negative. Mildly improved lung volumes compared to the recent CTA chest. Hepatobiliary: Negative liver and gallbladder. Pancreas: Negative. Spleen: Negative. Adrenals/Urinary Tract: Normal adrenal glands. Kidneys appear symmetric and nonobstructed. Punctate left  nephrolithiasis suspected on coronal image 73. Both ureters are decompressed. No renal or ureteral inflammation. However, the bladder has a mildly indistinct appearance such as on series 3, image 71 and coronal image 62. Pelvic phleboliths, but no other urinary calculus identified. Both ureterovesical junctions appear normal. Stomach/Bowel: Moderate diverticulosis of the large bowel from the splenic flexure through the sigmoid. Those segments are fairly decompressed and no active inflammation is identified. Average retained stool in the transverse and right colon. Normal appendix on series 3, image 60. No large bowel inflammation. Terminal ileum is within normal limits and there is no dilated small bowel. Decompressed stomach and duodenum. No free air, free fluid, or mesenteric inflammation identified. Small fat containing umbilical hernia with no complicating features. Vascular/Lymphatic: Mild Aortoiliac calcified atherosclerosis. Normal caliber abdominal aorta. Major arterial structures are patent. Portal venous system appears to be patent. No lymphadenopathy. Reproductive: Negative CT appearance. Other: No pelvis free fluid. Musculoskeletal: Lumbar spine detailed separately. No acute osseous abnormality identified. IMPRESSION: 1. Mildly indistinct appearance of the bladder. Correlate with urinalysis to exclude UTI. 2. No other acute or inflammatory process identified in the abdomen or pelvis. Punctate left nephrolithiasis, but no obstructive uropathy or other urinary calculus. Normal appendix. Distal large bowel diverticulosis without active  inflammation. Aortic Atherosclerosis (ICD10-I70.0). 3. Lumbar spine CT reported separately. Electronically Signed   By: Odessa Fleming M.D.   On: 02/18/2023 06:14   US SCROTUM W/DOPPLER  Result Date: 02/18/2023 CLINICAL DATA:  67 year old male with left side scrotal pain. EXAM: SCROTAL ULTRASOUND DOPPLER ULTRASOUND OF THE TESTICLES TECHNIQUE: Complete ultrasound examination of the testicles, epididymis, and other scrotal structures was performed. Color and spectral Doppler ultrasound were also utilized to evaluate blood flow to the testicles. COMPARISON:  [facility] scrotal ultrasound 12/09/2010. FINDINGS: Right testicle Measurements: 4.7 x 2.2 x 3.2 cm. No mass or microlithiasis visualized. Left testicle Measurements: 4.1 x 2.2 x 2.8 cm. No mass or microlithiasis visualized. Right epididymis:  Normal in size and appearance. Left epididymis:  Normal in size and appearance. Hydrocele:  Small bilateral hydroceles, appear fairly simple. Varicocele: Positive for asymmetric left side pampiniform veins suggesting varicocele. And on images 79-81 possible slow flow or thrombosis of a varix is demonstrated. Pulsed Doppler interrogation of both testes demonstrates normal low resistance arterial and venous waveforms bilaterally. IMPRESSION: 1. Negative for testicular mass or torsion. 2. Positive for left side varicocele, with possible slow flow or thrombosis of one of the affected veins (images 79-81). 3. Small bilateral hydroceles. Electronically Signed   By: Odessa Fleming M.D.   On: 02/18/2023 04:08   ECHOCARDIOGRAM COMPLETE  Result Date: 02/07/2023    ECHOCARDIOGRAM REPORT   Patient Name:   SEVREN KETT Date of Exam: 02/07/2023 Medical Rec #:  [id number]        Height:       65.0 in Accession #:    [id number]       Weight:       248.5 lb Date of Birth:  1956/04/23        BSA:          2.169 m Patient Age:    1 years         BP:           106/73 mmHg Patient Gender: M                HR:           62 bpm. Exam  Location:  Inpatient Procedure: 2D  Echo, Cardiac Doppler and Color Doppler Indications:    Elevated Troponin  History:        Patient has no prior history of Echocardiogram examinations,                 most recent 03/17/2021. CHF, Pacemaker, Stroke, Arrythmias:Atrial                 Fibrillation, Signs/Symptoms:Chest Pain and Syncope; Risk                 Factors:Dyslipidemia.  Sonographer:    Lucendia Herrlich Referring Phys: [id number] JUSTIN B HOWERTER IMPRESSIONS  1. Left ventricular ejection fraction, by estimation, is 60 to 65%. The left ventricle has normal function. The left ventricle has no regional wall motion abnormalities. There is mild concentric left ventricular hypertrophy. Left ventricular diastolic parameters were normal.  2. Right ventricular systolic function is normal. The right ventricular size is normal.  3. The mitral valve is normal in structure. Trivial mitral valve regurgitation. No evidence of mitral stenosis.  4. The aortic valve is tricuspid. There is mild calcification of the aortic valve. Aortic valve regurgitation is not visualized. No aortic stenosis is present.  5. The inferior vena cava is normal in size with greater than 50% respiratory variability, suggesting right atrial pressure of 3 mmHg. FINDINGS  Left Ventricle: Left ventricular ejection fraction, by estimation, is 60 to 65%. The left ventricle has normal function. The left ventricle has no regional wall motion abnormalities. The left ventricular internal cavity size was normal in size. There is  mild concentric left ventricular hypertrophy. Left ventricular diastolic parameters were normal. Right Ventricle: The right ventricular size is normal. No increase in right ventricular wall thickness. Right ventricular systolic function is normal. Left Atrium: Left atrial size was normal in size. Right Atrium: Right atrial size was normal in size. Pericardium: There is no evidence of pericardial effusion. Mitral Valve: The mitral valve  is normal in structure. Trivial mitral valve regurgitation. No evidence of mitral valve stenosis. Tricuspid Valve: The tricuspid valve is normal in structure. Tricuspid valve regurgitation is trivial. No evidence of tricuspid stenosis. Aortic Valve: The aortic valve is tricuspid. There is mild calcification of the aortic valve. Aortic valve regurgitation is not visualized. No aortic stenosis is present. Aortic valve peak gradient measures 9.9 mmHg. Pulmonic Valve: The pulmonic valve was normal in structure. Pulmonic valve regurgitation is not visualized. No evidence of pulmonic stenosis. Aorta: The aortic root is normal in size and structure. Venous: The inferior vena cava is normal in size with greater than 50% respiratory variability, suggesting right atrial pressure of 3 mmHg. IAS/Shunts: No atrial level shunt detected by color flow Doppler. Additional Comments: A device lead is visualized.  LEFT VENTRICLE PLAX 2D LVIDd:         4.20 cm   Diastology LVIDs:         3.10 cm   LV e' medial:   9.70 cm/s LV PW:         1.20 cm   LV E/e' medial: 8.1 LV IVS:        1.20 cm LVOT diam:     2.30 cm LV SV:         99 LV SV Index:   45 LVOT Area:     4.15 cm  RIGHT VENTRICLE             IVC RV S prime:     14.50 cm/s  IVC diam: 2.00 cm TAPSE (M-mode):  1.6 cm LEFT ATRIUM           Index        RIGHT ATRIUM           Index LA diam:      4.20 cm 1.94 cm/m   RA Area:     16.50 cm LA Vol (A2C): 69.8 ml 32.18 ml/m  RA Volume:   42.30 ml  19.50 ml/m LA Vol (A4C): 66.0 ml 30.42 ml/m  AORTIC VALVE AV Area (Vmax): 3.26 cm AV Vmax:        157.00 cm/s AV Peak Grad:   9.9 mmHg LVOT Vmax:      123.20 cm/s LVOT Vmean:     78.000 cm/s LVOT VTI:       0.237 m  AORTA Ao Root diam: 3.20 cm Ao Asc diam:  3.60 cm MITRAL VALVE               TRICUSPID VALVE MV Area (PHT): 3.60 cm    TR Peak grad:   9.0 mmHg MV Decel Time: 211 msec    TR Vmax:        150.00 cm/s MV E velocity: 78.90 cm/s MV A velocity: 67.00 cm/s  SHUNTS MV E/A ratio:  1.18         Systemic VTI:  0.24 m                            Systemic Diam: 2.30 cm Arvilla Meres MD Electronically signed by Arvilla Meres MD Signature Date/Time: 02/07/2023/1:48:36 PM    Final    CT Angio Chest Pulmonary Embolism (PE) W or WO Contrast  Result Date: 02/06/2023 CLINICAL DATA:  Pulmonary embolism suspected. High probability. Chest pain or shortness of breath with dizziness since yesterday. EXAM: CT ANGIOGRAPHY CHEST WITH CONTRAST TECHNIQUE: Multidetector CT imaging of the chest was performed using the standard protocol during bolus administration of intravenous contrast. Multiplanar CT image reconstructions and MIPs were obtained to evaluate the vascular anatomy. RADIATION DOSE REDUCTION: This exam was performed according to the departmental dose-optimization program which includes automated exposure control, adjustment of the mA and/or kV according to patient size and/or use of iterative reconstruction technique. CONTRAST:  75mL OMNIPAQUE IOHEXOL 350 MG/ML SOLN COMPARISON:  PA and lateral chest today, PA and lateral chest 07/27/2022. No prior chest CT for comparison. FINDINGS: Cardiovascular: There is a loop recorder device implanted in the anterior reveal left chest wall. The heart is slightly enlarged, with left ventricular and septal wall hypertrophy. Scattered single-vessel coronary artery calcific plaques noted in the LAD. There is no pericardial effusion. Pulmonary veins are normal caliber. Respiratory motion obscures the subsegmental arterial bed in the lower lung fields. The pulmonary arteries are normal in caliber and otherwise without evidence of thromboembolism. There is scattered calcific plaque in the aorta and left subclavian artery. There is no aortic or great vessel aneurysm, dissection or stenosis. Mediastinum/Nodes: No enlarged mediastinal, hilar, or axillary lymph nodes. Thyroid gland, trachea, and esophagus demonstrate no significant findings. Lungs/Pleura: No pleural effusion,  thickening or pneumothorax. Mild chronic elevation again noted right hemidiaphragm. The central airways are patent, but there is noted diffuse bronchial thickening, without bronchiectasis or visible bronchial plugging. The lungs are clear of infiltrates. There are scattered linear scar-like opacities in the bases. Also suspected scattered air trapping in the lower zones. There is a solitary well-circumscribed 9.1 x 5.9 mm ovoid pulmonary nodule in the right lower lobe, best seen on  6:67. There are no other visible nodules. Upper Abdomen: Moderate hepatic steatosis.  No acute abnormality. Musculoskeletal: Partially visible anterior fusion plating lower C-spine ending at C7. Multilevel bulky bridging enthesopathy right anterolateral lower T-spine with multilevel degenerative discs. No acute or other significant osseous findings or chest wall masses. Review of the MIP images confirms the above findings. IMPRESSION: 1. No evidence of arterial dilatation or embolus as far as visualized. Subsegmental lower zonal arteries are obscured by breathing motion. 2. Cardiomegaly with left ventricular and septal wall hypertrophy. 3. Aortic and LAD coronary artery atherosclerosis. 4. Bronchitis with scattered air trapping in the lower zones. 5. 9.1 x 5.9 mm right lower lobe pulmonary nodule. Consider one of the following in 3 months for both low-risk and high-risk individuals: (a) repeat chest CT, (b) follow-up PET-CT, or (c) tissue sampling. This recommendation follows the consensus statement: Guidelines for Management of Incidental Pulmonary Nodules Detected on CT Images: From the Fleischner Society 2017; Radiology 2017; 284:228-243. 6. Moderate hepatic steatosis. Electronically Signed   By: Almira Bar M.D.   On: 02/06/2023 21:50   DG Chest 2 View  Result Date: 02/06/2023 CLINICAL DATA:  Chest pain EXAM: CHEST - 2 VIEW COMPARISON:  Chest x-ray 10/28/2015 FINDINGS: Loop recorder device is seen in the anterior left chest. The  heart size and mediastinal contours are within normal limits. Both lungs are clear. Cervical spinal fusion plate is present. No acute fractures are seen. IMPRESSION: No active cardiopulmonary disease. Electronically Signed   By: Darliss Cheney M.D.   On: 02/06/2023 20:41    Clinical Course as of 02/18/23 1311  Fri Feb 18, 2023  [id number] Pt evaluated and resting on stretcher. Pt notes that his symptoms started with bilateral leg numbness 5 days ago. He has been more active recently due to retiring and has been cutting his grass. Denies recent fall, injury, trauma, heavy lifting. Denies previous lower back surgeries. Notes history of back pain to right lower back several weeks ago treated with Rx muscle relaxer. Pt has been ambulatory at home. Denies bowel/bladder incontinence, difficulty with urination. On exam, patient with decreased sensation to right lateral thigh and bilateral lateral hips. Strength and sensation intact to remainder of lower extremities bilaterally. No spinal TTP. No TTP noted to musculature of back.  [SB]  1100 Consult with Neurosurgery, Dr. Dutch Quint who reviewed imaging and recommends orsal steroid Dosepak and follow-up in the office next week for evaluation. [SB]  1122 Discussed with patient regarding recommendations as per neurosurgery with recommendations for oral steroid Dosepak and follow-up with neurosurgery next week.  Answered all verbal questions.  Patient appears safe for discharge at this time. [SB]    Clinical Course User Index [SB] [name] Shedlock A, PA-C     Presenting suspicious for likely stenosis as cause of patient's symptoms.  Patient ambulatory without assistance or difficulty at time of discharge.  Patient without IV drug use history or history of lumbar surgeries.  Patient to be discharged home with prescription for Medrol Dosepak.  PDMP reviewed, short course of Percocet sent to patient's pharmacy for breakthrough pain.  Instructed patient to follow-up with Dr. Dutch Quint  from neurosurgery next week regarding today's ED visit. Supportive care and strict return precautions discussed with patient. Patient acknowledges and verbalizes understanding. Pt appears safe for discharge. Follow up as indicated in discharge paperwork.    ED Discharge Orders          Ordered    oxyCODONE-acetaminophen (PERCOCET/ROXICET) 5-325 MG tablet  Every 6 hours  PRN        02/18/23 1236    methylPREDNISolone (MEDROL DOSEPAK) 4 MG TBPK tablet        02/18/23 1236            This chart was dictated using voice recognition software, Dragon. Despite the best efforts of this provider to proofread and correct errors, errors may still occur which can change documentation meaning.   Decarlos Empey A, PA-C 02/18/23 1312    Terald Sleeper, MD 02/18/23 1416

## 2023-02-18 NOTE — ED Notes (Signed)
Pt transported to MRI. Pt a&ox4,vss.

## 2023-02-18 NOTE — ED Triage Notes (Signed)
Pt to ED from home c/o left testicle pain, urinary urgency and frequency, numbness to left inner thigh and bilateral outer thighs since Monday.  Tried to follow up with PCP but told to come to ED.  Pain radiates up left groin to left lower back.  Denies n/v/d, fevers, or blood in urine.  Denies hx of kidney stones.

## 2023-02-18 NOTE — Discharge Instructions (Addendum)
Your MRI today of your back showed concerns for spinal stenosis and compression of your spinal cord.  You will be sent a prescription for a Medrol Dosepak, take as directed.  You also be sent a short course of Percocet, take as directed for breakthrough pain.  You may place ice/heat to the affected area up to 15 minutes at a time.  Sure to place a barrier between your skin and the ice/heat.  Attached is information for the neurosurgeon, Dr. Dutch Quint, call today and set up a follow-up appointment regarding today's ED visit for follow-up next week.  Follow-up with your primary care provider regarding today's ED visit.  Return to the emergency department if you are experiencing increasing/worsening symptoms.

## 2023-02-18 NOTE — ED Provider Notes (Signed)
EMERGENCY DEPARTMENT AT [facility] Provider Note   CSN: [id number] Arrival date & time: 02/18/23  0101     History  Chief Complaint  Patient presents with   Testicle Pain   Numbness    Cristian Gonzales is a 67 y.o. male.   Testicle Pain   67 year old male presents emergency department with complaints of left-sided testicular pain, numbness of bilateral thighs as well as some left-sided flank pain.  Patient states his symptoms began after awakening on Monday and have persisted since onset.  Reports left-sided flank pain with radiation to left inguinal region.  States that he also has some left-sided testicular pain and some mild swelling.  Reports decree sensation on the outsides of both of his thighs and as well as the inside of his left thigh.  Denies any fever, chills, nausea, vomiting, chest pain, shortness of breath, urinary symptoms, change in bowel habits.  Denies any saddle anesthesia, bowel/bladder dysfunction, weakness in lower extremities, history of IV drug use, known malignancy, prolonged corticosteroid use.  Past medical history significant for paroxysmal atrial fibrillation on Eliquis, CVA, C-spine fracture, left-sided thoracic back pain  Home Medications Prior to Admission medications   Medication Sig Start Date End Date Taking? Authorizing Provider  albuterol (VENTOLIN HFA) 108 (90 Base) MCG/ACT inhaler TAKE 2 PUFFS BY MOUTH EVERY 6 HOURS AS NEEDED FOR WHEEZE OR SHORTNESS OF BREATH Patient taking differently: Inhale 2 puffs into the lungs every 6 (six) hours as needed for wheezing or shortness of breath. 01/29/22   Nafziger, Kandee Keen, NP  apixaban (ELIQUIS) 5 MG TABS tablet Take 1 tablet (5 mg total) by mouth 2 (two) times daily. 02/07/23   Burnadette Pop, MD  aspirin EC 81 MG EC tablet Take 1 tablet (81 mg total) by mouth daily. Swallow whole. Patient taking differently: Take 81 mg by mouth 2 (two) times a week. Swallow whole. 03/20/21    Metzger-Cihelka, Cristie Hem, NP  atorvastatin (LIPITOR) 80 MG tablet TAKE 1 TABLET EVERY DAY Patient taking differently: Take 80 mg by mouth 2 (two) times a week. 05/18/22   Nafziger, Kandee Keen, NP  buPROPion (WELLBUTRIN XL) 150 MG 24 hr tablet Take 1 tablet (150 mg total) by mouth daily. 12/08/22   Nafziger, Kandee Keen, NP  metoprolol tartrate (LOPRESSOR) 25 MG tablet Take 1/2 tablet (12.5 mg total) by mouth 2 (two) times daily. 02/07/23   Burnadette Pop, MD      Allergies    Tramadol, Morphine, and Trazodone and nefazodone    Review of Systems   Review of Systems  Genitourinary:  Positive for testicular pain.    Physical Exam Updated Vital Signs BP 132/81   Pulse 60   Temp 97.7 F (36.5 C) (Oral)   Resp 18   Ht 5\' 5"  (1.651 m)   Wt 113.4 kg   SpO2 97%   BMI 41.60 kg/m  Physical Exam Vitals and nursing note reviewed.  Constitutional:      General: He is not in acute distress.    Appearance: He is well-developed.  HENT:     Head: Normocephalic and atraumatic.  Eyes:     Conjunctiva/sclera: Conjunctivae normal.  Cardiovascular:     Rate and Rhythm: Normal rate and regular rhythm.     Heart sounds: No murmur heard. Pulmonary:     Effort: Pulmonary effort is normal. No respiratory distress.     Breath sounds: Normal breath sounds.  Abdominal:     Palpations: Abdomen is soft.     Tenderness:  There is abdominal tenderness. There is left CVA tenderness. There is no right CVA tenderness or guarding.     Comments: Patient with mild left lower quadrant tenderness to palpation.  Genitourinary:    Comments: Patient without gross swelling appreciated externally.  Cremasteric reflex intact bilaterally.  Possible back of arms versus hydrocele palpated on left side.  Patient with testicular tenderness as well as epididymal tenderness left side.  Testicles in vertical alignment.  Patient did find relief with elevation of testicle. Musculoskeletal:        General: No swelling.     Cervical back: Neck  supple.     Right lower leg: No edema.     Left lower leg: No edema.     Comments: Midline tenderness of cervical, thoracic, lumbar spine with no obvious step-off or deformity noted.  Paraspinal tenderness noted in the left lumbar region but query whether or not it is CVA tenderness versus paraspinal tenderness.  Muscular strength 5 out of 5 bilateral lower extremity hip flexion/extension, knee flexion/extension, ankle dorsi/plantarflexion.  Patient with decreased sensation on lateral aspects of thigh as well as medial aspect of left thigh.  Otherwise, no sensory deficits along major nerve distributions of lower extremities.  Skin:    General: Skin is warm and dry.     Capillary Refill: Capillary refill takes less than 2 seconds.  Neurological:     Mental Status: He is alert.  Psychiatric:        Mood and Affect: Mood normal.     ED Results / Procedures / Treatments   Labs (all labs ordered are listed, but only abnormal results are displayed) Labs Reviewed  URINALYSIS, ROUTINE W REFLEX MICROSCOPIC - Abnormal; Notable for the following components:      Result Value   Specific Gravity, Urine >1.030 (*)    All other components within normal limits  BASIC METABOLIC PANEL - Abnormal; Notable for the following components:   Glucose, Bld 123 (*)    Calcium 8.6 (*)    All other components within normal limits  CBC    EKG None  Radiology CT L-SPINE NO CHARGE  Result Date: 02/18/2023 CLINICAL DATA:  67 year old male with left flank pain. EXAM: CT LUMBAR SPINE WITH CONTRAST TECHNIQUE: Technique: Multiplanar CT images of the lumbar spine were reconstructed from contemporary CT of the Abdomen and Pelvis. RADIATION DOSE REDUCTION: This exam was performed according to the departmental dose-optimization program which includes automated exposure control, adjustment of the mA and/or kV according to patient size and/or use of iterative reconstruction technique. CONTRAST:  No additional COMPARISON:   CT Abdomen and Pelvis today reported separately. FINDINGS: Segmentation: Normal. Alignment: Relatively normal lumbar lordosis. No significant spondylolisthesis or scoliosis. Vertebrae: Bone mineralization is within normal limits. Maintained vertebral body height. No acute osseous abnormality identified. Intact visible sacrum and SI joints. Paraspinal and other soft tissues: Abdomen and pelvis are detailed separately. Lumbar paraspinal soft tissues are within normal limits. 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 The following superimposed degenerative changes are noted: T10-T11: Disc space loss with mostly anterior disc osteophyte complex. Severe right T10 foraminal stenosis. T11-T12: Disc space loss. Circumferential disc osteophyte complex and calcified ligament flavum hypertrophy on the left (series 1, image 22). At least moderate overall spinal stenosis here at the level of the lower thoracic spinal cord. Also moderate to severe T11 foraminal stenosis greater on the right.  T12-L1:  Better maintained disc space.  Mild disc bulging. L1-L2: Maintained disc space. Mild disc bulging. Mild facet and ligament flavum hypertrophy. L2-L3: Vacuum disc. Circumferential disc osteophyte complex with mild to moderate facet and ligament flavum hypertrophy. Mild to moderate overall spinal stenosis here. Moderate to severe bilateral L2 foraminal stenosis. L3-L4: Maintained disc height but circumferential disc bulge and moderate to severe facet and ligament flavum hypertrophy. Vacuum facet. Severe spinal stenosis suspected. Moderate to severe bilateral L3 foraminal stenosis. L4-L5: Vacuum disc. Severe facet hypertrophy, bulky heterotopic ossification on the right and vacuum facet. Circumferential disc bulge. Moderate to severe spinal stenosis. Severe bilateral L4 foraminal stenosis. L5-S1: Vacuum disc and  disc space loss. Severe facet hypertrophy and vacuum facet greater on the left. Moderate to severe bilateral L5 foraminal stenosis. IMPRESSION: 1. No acute osseous abnormality in the lumbar spine. 2. 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 And Severe lower lumbar facet arthropathy L3-L4 through L5-S1. 3. Subsequent suspected moderate or severe spinal stenosis: T11-T12, L2-L3, L3-L4 (severe), L4-L5 (moderate to severe). Associated moderate to severe neural foraminal stenosis at the right T10, bilateral T11, bilateral L2, L3, L4, and L5 nerve levels. 4.  CT Abdomen and Pelvis today reported separately. Electronically Signed   By: Odessa Fleming M.D.   On: 02/18/2023 06:20   CT ABDOMEN PELVIS W CONTRAST  Result Date: 02/18/2023 CLINICAL DATA:  67 year old male with left flank pain. EXAM: CT ABDOMEN AND PELVIS WITH CONTRAST TECHNIQUE: Multidetector CT imaging of the abdomen and pelvis was performed using the standard protocol following bolus administration of intravenous contrast. RADIATION DOSE REDUCTION: This exam was performed according to the departmental dose-optimization program which includes automated exposure control, adjustment of the mA and/or kV according to patient size and/or use of iterative reconstruction technique. CONTRAST:  75mL OMNIPAQUE IOHEXOL 350 MG/ML SOLN COMPARISON:  Lumbar spine CT reported separately. Scrotal ultrasound with Doppler earlier this morning. CTA chest 02/06/2023. FINDINGS: Lower chest: Negative. Mildly improved lung volumes compared to the recent CTA chest. Hepatobiliary: Negative liver and gallbladder. Pancreas: Negative. Spleen: Negative. Adrenals/Urinary Tract: Normal adrenal glands. Kidneys appear symmetric and nonobstructed. Punctate left nephrolithiasis suspected on coronal image 73. Both ureters are decompressed. No renal or ureteral inflammation. However, the bladder has a mildly  indistinct appearance such as on series 3, image 71 and coronal image 62. Pelvic phleboliths, but no other urinary calculus identified. Both ureterovesical junctions appear normal. Stomach/Bowel: Moderate diverticulosis of the large bowel from the splenic flexure through the sigmoid. Those segments are fairly decompressed and no active inflammation is identified. Average retained stool in the transverse and right colon. Normal appendix on series 3, image 60. No large bowel inflammation. Terminal ileum is within normal limits and there is no dilated small bowel. Decompressed stomach and duodenum. No free air, free fluid, or mesenteric inflammation identified. Small fat containing umbilical hernia with no complicating features. Vascular/Lymphatic: Mild Aortoiliac calcified atherosclerosis. Normal caliber abdominal aorta. Major arterial structures are patent. Portal venous system appears to be patent. No lymphadenopathy. Reproductive: Negative CT appearance. Other: No pelvis free fluid. Musculoskeletal: Lumbar spine detailed separately. No acute osseous abnormality identified. IMPRESSION: 1. Mildly indistinct appearance of the bladder. Correlate with urinalysis to exclude UTI. 2. No other acute or inflammatory process identified in the abdomen or pelvis. Punctate left nephrolithiasis, but no obstructive uropathy or other urinary calculus. Normal appendix. Distal large bowel diverticulosis without active inflammation. Aortic Atherosclerosis (ICD10-I70.0). 3. Lumbar spine CT reported  separately. Electronically Signed   By: Odessa Fleming M.D.   On: 02/18/2023 06:14   US SCROTUM W/DOPPLER  Result Date: 02/18/2023 CLINICAL DATA:  67 year old male with left side scrotal pain. EXAM: SCROTAL ULTRASOUND DOPPLER ULTRASOUND OF THE TESTICLES TECHNIQUE: Complete ultrasound examination of the testicles, epididymis, and other scrotal structures was performed. Color and spectral Doppler ultrasound were also utilized to evaluate blood  flow to the testicles. COMPARISON:  [facility] scrotal ultrasound 12/09/2010. FINDINGS: Right testicle Measurements: 4.7 x 2.2 x 3.2 cm. No mass or microlithiasis visualized. Left testicle Measurements: 4.1 x 2.2 x 2.8 cm. No mass or microlithiasis visualized. Right epididymis:  Normal in size and appearance. Left epididymis:  Normal in size and appearance. Hydrocele:  Small bilateral hydroceles, appear fairly simple. Varicocele: Positive for asymmetric left side pampiniform veins suggesting varicocele. And on images 79-81 possible slow flow or thrombosis of a varix is demonstrated. Pulsed Doppler interrogation of both testes demonstrates normal low resistance arterial and venous waveforms bilaterally. IMPRESSION: 1. Negative for testicular mass or torsion. 2. Positive for left side varicocele, with possible slow flow or thrombosis of one of the affected veins (images 79-81). 3. Small bilateral hydroceles. Electronically Signed   By: Odessa Fleming M.D.   On: 02/18/2023 04:08    Procedures Procedures    Medications Ordered in ED Medications  oxyCODONE-acetaminophen (PERCOCET/ROXICET) 5-325 MG per tablet 1 tablet (1 tablet Oral Given 02/18/23 0339)  ketorolac (TORADOL) 15 MG/ML injection 15 mg (15 mg Intravenous Given 02/18/23 0529)  iohexol (OMNIPAQUE) 350 MG/ML injection 75 mL (75 mLs Intravenous Contrast Given 02/18/23 0542)  dexamethasone (DECADRON) injection 10 mg (10 mg Intravenous Given 02/18/23 2130)    ED Course/ Medical Decision Making/ A&P                             Medical Decision Making Amount and/or Complexity of Data Reviewed Labs: ordered. Radiology: ordered.  Risk Prescription drug management.   This patient presents to the ED for concern of numbness, testicle pain, flank pain, this involves an extensive number of treatment options, and is a complaint that carries with it a high risk of complications and morbidity.  The differential diagnosis includes  pyelonephritis, nephrolithiasis, fracture, strain/sprain, dislocation, cauda equina, spinal epidural abscess, spinal cord compression, testicular torsion, hydrocele, varicocele, epididymitis   Co morbidities that complicate the patient evaluation  See HPI   Additional history obtained:  Additional history obtained from EMR External records from outside source obtained and reviewed including hospital records   Lab Tests:  I Ordered, and personally interpreted labs.  The pertinent results include: No leukocytosis.  No evidence of anemia.  Platelets within normal range.  Mild hypocalcemia but otherwise, electrolytes within normal limits.  No renal dysfunction.  UA without abnormality.   Imaging Studies ordered:  I ordered imaging studies including scrotal ultrasound, CT abdomen pelvis, CT L-spine I independently visualized and interpreted imaging which showed  Scrotal ultrasound: Negative for testicular mass/torsion.  Left-sided varicocele.  Small bilateral hydrocele. CT abdomen pelvis: Mildly indistinct appearance of bladder.  No other acute or inflammatory process identified.  Punctate left nephrolithiasis.  Normal appendix.  Diverticulosis.  Aortic atherosclerosis. CT L-spine: No acute osseous abnormality lumbar spine.  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  Subsequent suspected moderate or severe spinal stenosis at T11-T12, L2-L3, L3-L4, L4-L5.  Associated moderate to severe neural foraminal stenosis. MRI C-spine  and L-spine pending I agree with the radiologist interpretation  Cardiac Monitoring: / EKG:  The patient was maintained on a cardiac monitor.  I personally viewed and interpreted the cardiac monitored which showed an underlying rhythm of: Sinus rhythm   Consultations Obtained:  I requested consultation with the attending physician Dr. Elpidio Anis who is agreement with treatment  plan going forward  Problem List / ED Course / Critical interventions / Medication management  Back pain, numbness, varicocele I ordered medication including oxycodone, Toradol   Reevaluation of the patient after these medicines showed that the patient improved I have reviewed the patients home medicines and have made adjustments as needed   Social Determinants of Health:  Some cigarette use.  Denies illicit drug use.   Test / Admission - Considered:  Back pain, numbness, varicocele Vitals signs within normal range and stable throughout visit. Laboratory/imaging studies significant for: See above 67 year old male presents emergency department with several day history of back pain, numbness and left-sided testicular pain.  Regarding left-sided testicle, patient with evidence of varicocele and small bilateral hydrocele is most likely causing patient's symptoms.  Will recommend while supporting undergarments and follow-up with urology in outpatient setting.  Regarding decreased sensation of bilateral legs in areas as depicted above and back pain, patient CT lumbar spine significant for significant degenerative changes as well as severe spinal narrowing.  Some concern for spinal cord compression given evidence of CT lumbar spine.  Will obtain MRIs of the thoracic as well as lumbar spine for further assessment.  Awaiting imaging studies at time of shift change.  At shift change, patient care handoff to PA-C blue.  Patient stable upon shift change.         Final Clinical Impression(s) / ED Diagnoses Final diagnoses:  None    Rx / DC Orders ED Discharge Orders     None         Peter Garter, [place] 02/18/23 8413    Mardene Sayer, MD 02/18/23 [phone]

## 2023-02-18 NOTE — ED Notes (Signed)
Second call to neuro

## 2023-02-18 NOTE — ED Notes (Addendum)
Pt received dc papers and verbalized understanding of follow up care. Pt ambulated w steady gait. Pt IV removed. Pt a&o4/VSS/NAD.
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
## 2023-02-21 ENCOUNTER — Ambulatory Visit: Payer: Medicare PPO | Attending: Nurse Practitioner | Admitting: Nurse Practitioner

## 2023-02-21 ENCOUNTER — Encounter: Payer: Self-pay | Admitting: Nurse Practitioner

## 2023-02-21 VITALS — BP 118/70 | HR 59 | Ht 65.0 in | Wt 251.0 lb

## 2023-02-21 DIAGNOSIS — I5032 Chronic diastolic (congestive) heart failure: Secondary | ICD-10-CM | POA: Diagnosis not present

## 2023-02-21 DIAGNOSIS — I4891 Unspecified atrial fibrillation: Secondary | ICD-10-CM

## 2023-02-21 DIAGNOSIS — I63412 Cerebral infarction due to embolism of left middle cerebral artery: Secondary | ICD-10-CM | POA: Diagnosis not present

## 2023-02-21 DIAGNOSIS — E782 Mixed hyperlipidemia: Secondary | ICD-10-CM | POA: Diagnosis not present

## 2023-02-21 DIAGNOSIS — R079 Chest pain, unspecified: Secondary | ICD-10-CM | POA: Diagnosis not present

## 2023-02-21 NOTE — Patient Instructions (Addendum)
Medication Instructions:  STOP Ranexa  *If you need a refill on your cardiac medications before your next appointment, please call your pharmacy*   Lab Work: None ordered If you have labs (blood work) drawn today and your tests are completely normal, you will receive your results only by: MyChart Message (if you have MyChart) OR A paper copy in the mail If you have any lab test that is abnormal or we need to change your treatment, we will call you to review the results.   Testing/Procedures: None ordered   Follow-Up: At [facility], you and your health needs are our priority.  As part of our continuing mission to provide you with exceptional heart care, we have created designated Provider Care Teams.  These Care Teams include your primary Cardiologist (physician) and Advanced Practice Providers (APPs -  Physician Assistants and Nurse Practitioners) who all work together to provide you with the care you need, when you need it.  We recommend signing up for the patient portal called "MyChart".  Sign up information is provided on this After Visit Summary.  MyChart is used to connect with patients for Virtual Visits (Telemedicine).  Patients are able to view lab/test results, encounter notes, upcoming appointments, etc.  Non-urgent messages can be sent to your provider as well.   To learn more about what you can do with MyChart, go to [email or web address].    Your next appointment:   6 month(s)  Provider:   Dr Jacques Navy Dr Bjorn Pippin Dr Flora Lipps Dr Wyline Mood Dr Servando Salina  Other Instructions

## 2023-02-23 ENCOUNTER — Telehealth: Payer: Self-pay

## 2023-02-23 NOTE — Telephone Encounter (Signed)
Transition Care Management Unsuccessful Follow-up Telephone Call  Date of discharge and from where:  Cristian [facility] 6/14  Attempts:  1st Attempt  Reason for unsuccessful TCM follow-up call:  Left voice message   [name] [facility] Guide, [facility] [phone] [address] [place], [place], [place] [address] Phone: [phone] Email: [email or web address]

## 2023-02-24 ENCOUNTER — Telehealth: Payer: Self-pay

## 2023-02-24 NOTE — Telephone Encounter (Signed)
Transition Care Management Unsuccessful Follow-up Telephone Call  Date of discharge and from where:  [facility]  Attempts:  2nd Attempt  Reason for unsuccessful TCM follow-up call:  Left voice message   [name] [facility] Guide, [facility] [phone] [address] [place], [place], [place] [address] Phone: [phone] Email: [email or web address]

## 2023-02-28 IMAGING — MR MR CERVICAL SPINE W/O CM
5 series · 38 of 48 positions shown · non-contrast
Comparison: None.

CLINICAL DATA: Right upper extremity weakness

EXAM:
MRI CERVICAL SPINE WITHOUT CONTRAST
TECHNIQUE: Multiplanar, multisequence MR imaging of the cervical spine was
performed. No intravenous contrast was administered.

[Series 9: T2 · sagittal · 3.0mm · 0.69mm/px · 6 of 15 slices shown (1 of 2)]
[im 1/15]
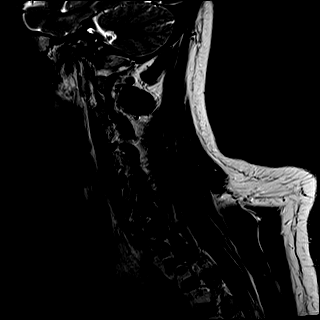
[im 3/15]
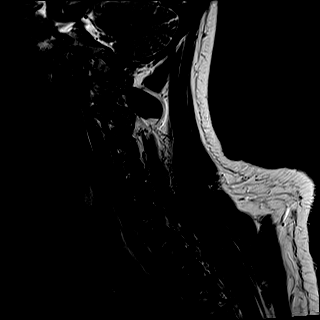
[im 6/15]
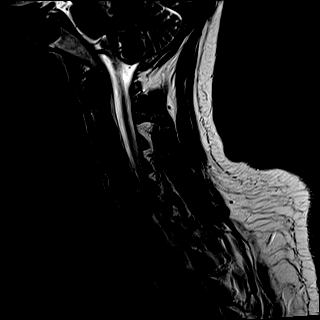
[im 9/15]
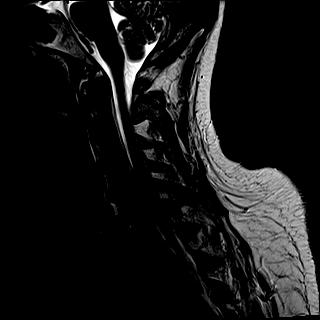
[im 12/15]
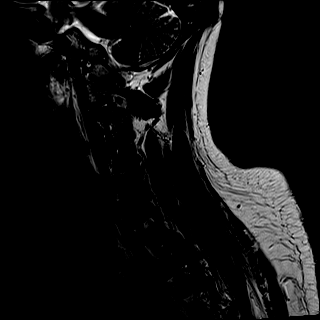
[im 15/15]
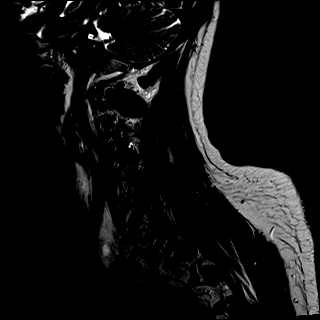

[Series 10: T1 · sagittal · 3.0mm · 0.69mm/px · 6 of 15 slices shown]
[im 1/15]
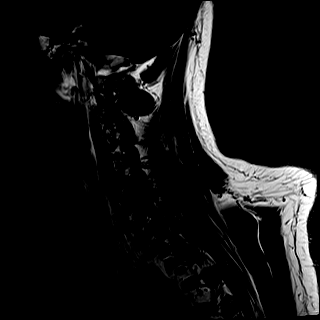
[im 3/15]
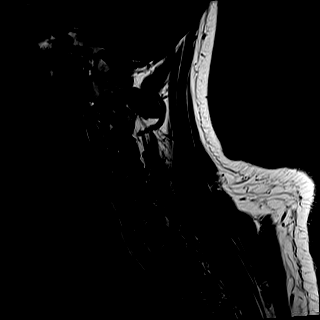
[im 6/15]
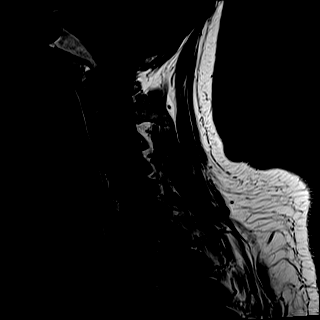
[im 9/15]
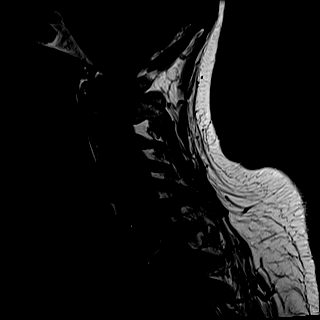
[im 12/15]
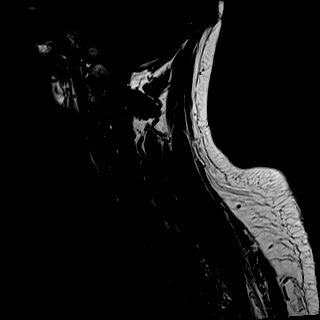
[im 15/15]
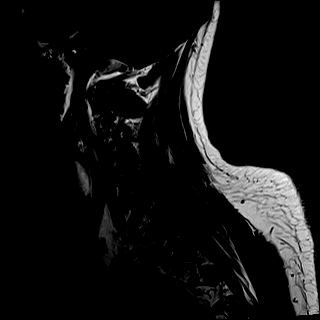

[Series 11: STIR · sagittal · 3.0mm · 0.86mm/px · 6 of 15 slices shown]
[im 1/15]
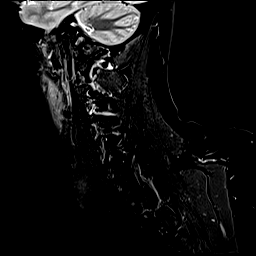
[im 3/15]
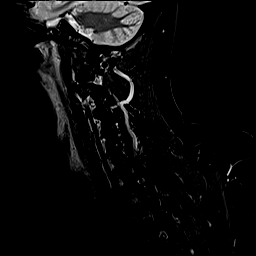
[im 6/15]
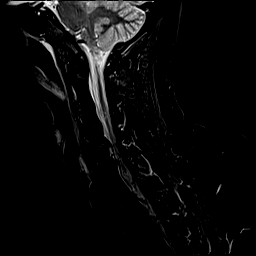
[im 9/15]
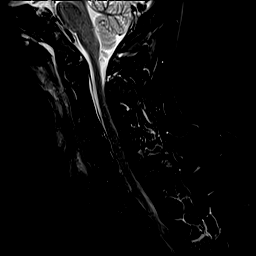
[im 12/15]
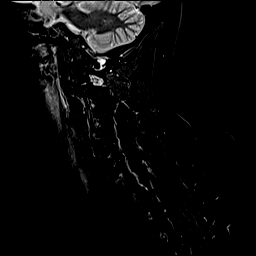
[im 15/15]
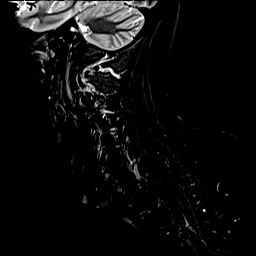

[Series 12: T2 · axial · 3.0mm · 0.66mm/px · z∈[-277,-161]mm · 12 of 40 slices shown (2 of 2)]
[im 1/40]
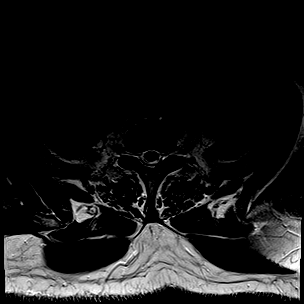
[im 3/40]
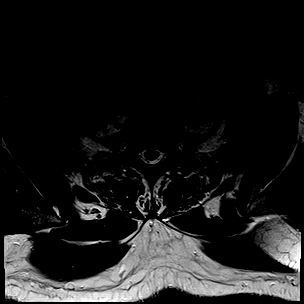
[im 6/40]
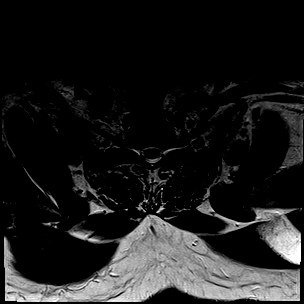
[im 9/40]
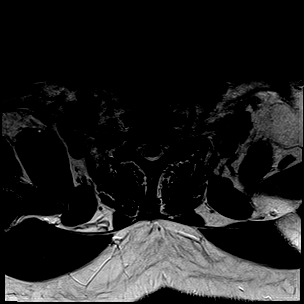
[im 12/40]
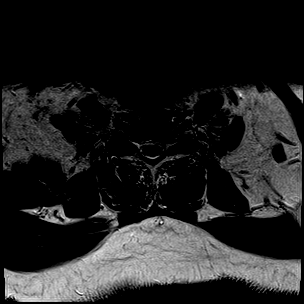
[im 14/40]
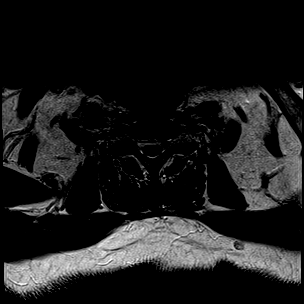
[im 17/40]
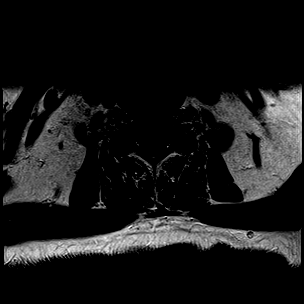
[im 20/40]
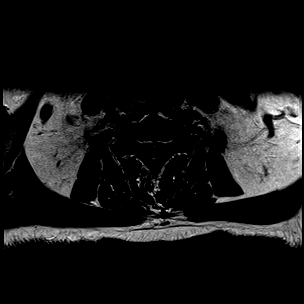
[im 23/40]
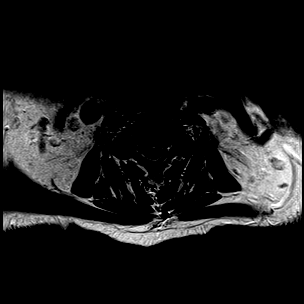
[im 28/40]
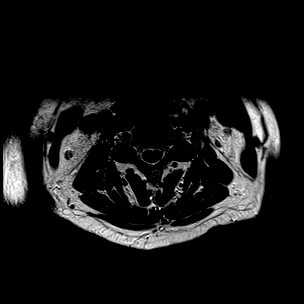
[im 34/40]
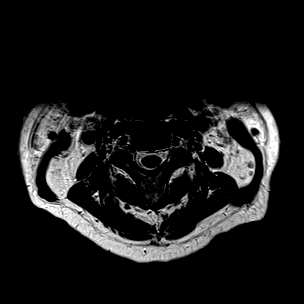
[im 40/40]
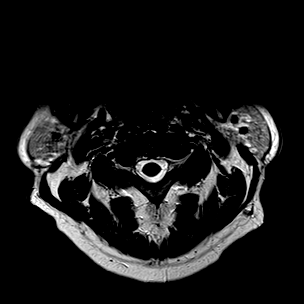

[Series 13: GRE · axial · 3.0mm · 0.39mm/px · z∈[-277,-161]mm · 8 of 40 slices shown]
[im 1/40]
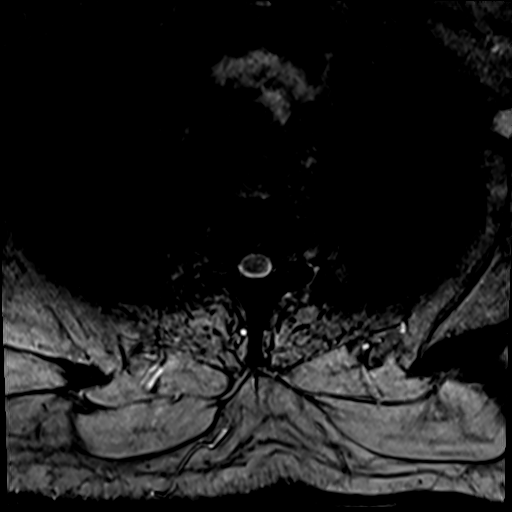
[im 6/40]
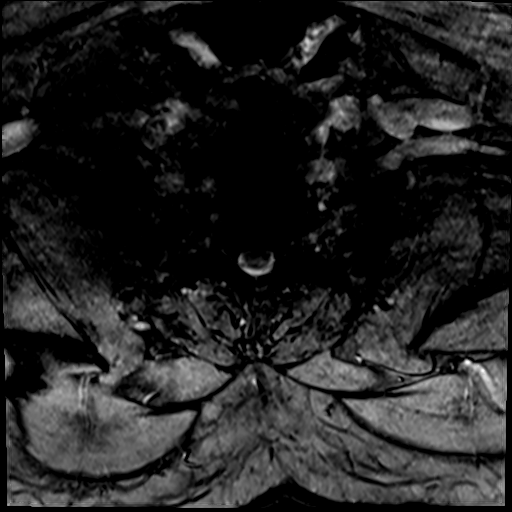
[im 12/40]
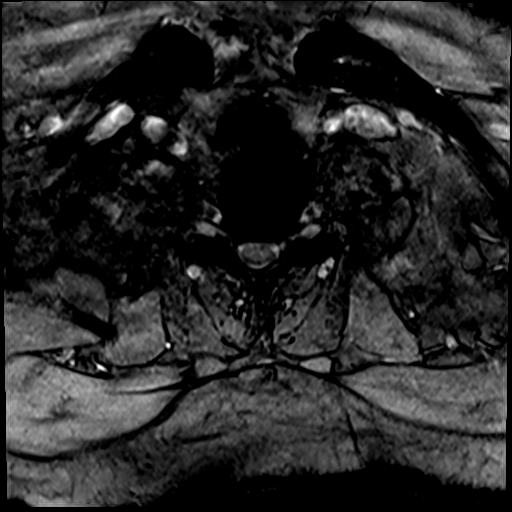
[im 17/40]
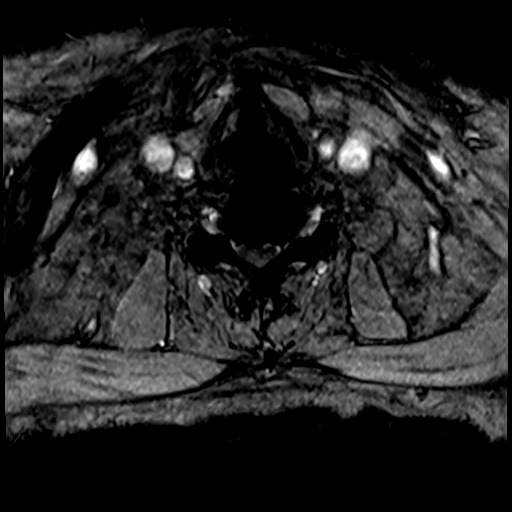
[im 23/40]
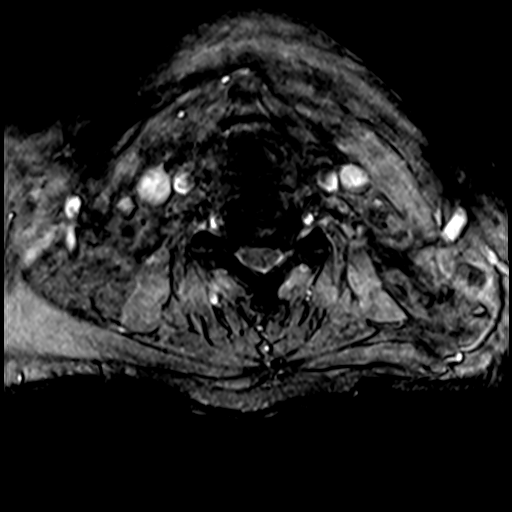
[im 28/40]
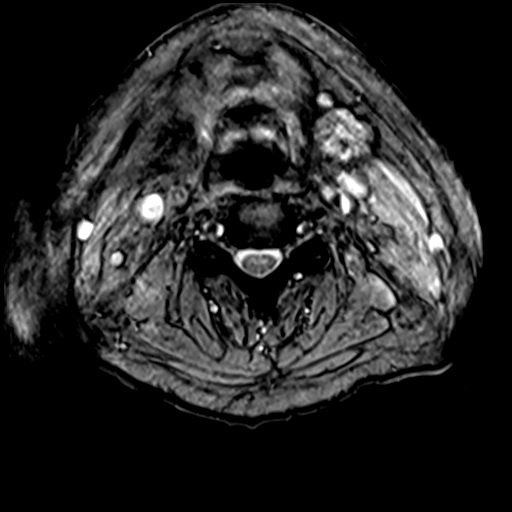
[im 34/40]
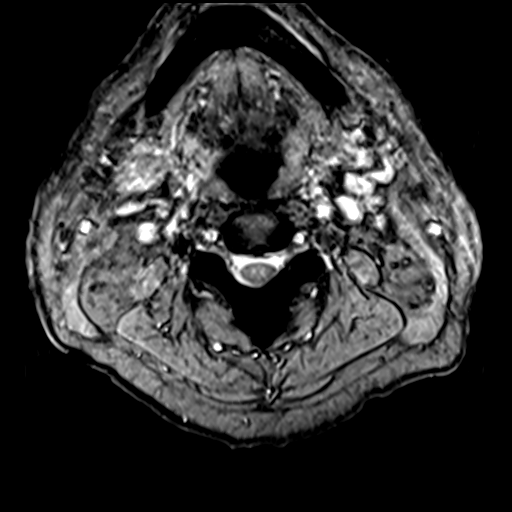
[im 40/40]
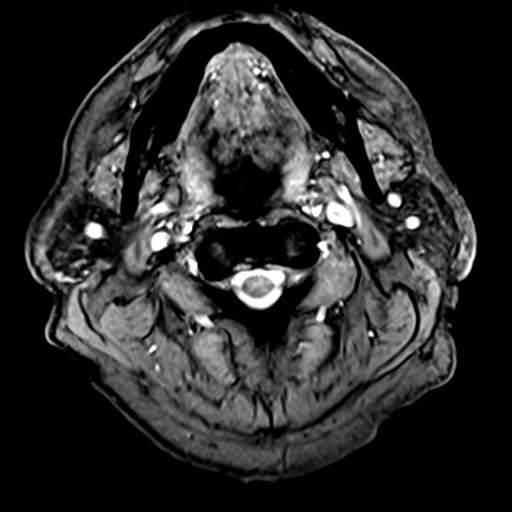

[38 of 48 positions shown; findings below may reference images not displayed]

FINDINGS: Alignment: Grade 1 retrolisthesis at C5-6

Vertebrae: C6-7 ACDF

Cord: Normal signal and morphology.

Posterior Fossa, vertebral arteries, paraspinal tissues: Negative.

Disc levels:

C1-2: Unremarkable.

C2-3: Normal disc space and facet joints. There is no spinal canal
stenosis. No neural foraminal stenosis.

C3-4: Normal disc space and facet joints. There is no spinal canal
stenosis. No neural foraminal stenosis.

C4-5: Small disc bulge with bilateral uncovertebral hypertrophy.
Mild spinal canal stenosis. Mild right and severe left neural
foraminal stenosis.

C5-6: Intermediate sized disc bulge with bilateral uncovertebral
hypertrophy. Mild spinal canal stenosis. Severe bilateral neural
foraminal stenosis.

C6-7: ACDF. There is no spinal canal stenosis. No neural foraminal
stenosis.

C7-T1: Normal disc space and facet joints. There is no spinal canal
stenosis. No neural foraminal stenosis.
IMPRESSION: 1. Mild spinal canal stenosis at C4-5 and C5-6.
2. Severe bilateral C6 and left C5 neural foraminal stenosis.

## 2023-03-06 ENCOUNTER — Other Ambulatory Visit: Payer: Self-pay | Admitting: Adult Health

## 2023-03-06 DIAGNOSIS — F39 Unspecified mood [affective] disorder: Secondary | ICD-10-CM

## 2023-03-07 ENCOUNTER — Ambulatory Visit (INDEPENDENT_AMBULATORY_CARE_PROVIDER_SITE_OTHER): Payer: Medicare PPO

## 2023-03-07 DIAGNOSIS — I4891 Unspecified atrial fibrillation: Secondary | ICD-10-CM | POA: Diagnosis not present

## 2023-03-07 LAB — CUP PACEART REMOTE DEVICE CHECK
Date Time Interrogation Session: 20240630231350
Implantable Pulse Generator Implant Date: 20220714

## 2023-03-09 NOTE — Telephone Encounter (Signed)
Pt is due for CPE. Refills 90 days but needs a CPE for further refills.

## 2023-03-28 NOTE — Progress Notes (Signed)
Carelink Summary Report / Loop Recorder 

## 2023-04-06 ENCOUNTER — Ambulatory Visit (INDEPENDENT_AMBULATORY_CARE_PROVIDER_SITE_OTHER): Payer: Medicare PPO

## 2023-04-06 DIAGNOSIS — I4891 Unspecified atrial fibrillation: Secondary | ICD-10-CM

## 2023-04-15 ENCOUNTER — Ambulatory Visit (INDEPENDENT_AMBULATORY_CARE_PROVIDER_SITE_OTHER): Payer: Medicare PPO | Admitting: Adult Health

## 2023-04-15 ENCOUNTER — Encounter: Payer: Self-pay | Admitting: Adult Health

## 2023-04-15 VITALS — BP 110/70 | HR 77 | Temp 98.9°F | Ht 65.0 in | Wt 245.0 lb

## 2023-04-15 DIAGNOSIS — I4891 Unspecified atrial fibrillation: Secondary | ICD-10-CM

## 2023-04-15 DIAGNOSIS — J40 Bronchitis, not specified as acute or chronic: Secondary | ICD-10-CM | POA: Diagnosis not present

## 2023-04-15 MED ORDER — AZITHROMYCIN 250 MG PO TABS
ORAL_TABLET | ORAL | 0 refills | Status: AC
Start: 2023-04-15 — End: 2023-04-20

## 2023-04-15 MED ORDER — HYDROCODONE BIT-HOMATROP MBR 5-1.5 MG/5ML PO SOLN
5.0000 mL | Freq: Three times a day (TID) | ORAL | 0 refills | Status: DC | PRN
Start: 2023-04-15 — End: 2023-07-25

## 2023-04-15 MED ORDER — PREDNISONE 10 MG PO TABS
ORAL_TABLET | ORAL | 0 refills | Status: DC
Start: 2023-04-15 — End: 2023-07-25

## 2023-04-15 MED ORDER — CARVEDILOL 3.125 MG PO TABS
3.1250 mg | ORAL_TABLET | Freq: Two times a day (BID) | ORAL | 3 refills | Status: DC
Start: 2023-04-15 — End: 2023-07-12
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
## 2023-04-15 NOTE — Patient Instructions (Signed)
I think you have bronchitis. I am going to send in some prednisone and azithromycin   I have also sent in a medication called Coreg - take this twice a day

## 2023-04-21 NOTE — Progress Notes (Signed)
Carelink Summary Report / Loop Recorder 

## 2023-05-04 ENCOUNTER — Telehealth: Payer: Self-pay | Admitting: Adult Health

## 2023-05-04 NOTE — Telephone Encounter (Signed)
Pt advised that Cristian Gonzales never prescribed this Rx it came from hospital. Pt notified that I will send to [facility] for advise and verbalized understanding.

## 2023-05-04 NOTE — Telephone Encounter (Signed)
Prescription Request  05/04/2023  LOV: 04/15/2023  What is the name of the medication or equipment?     apixaban (ELIQUIS) 5 MG TABS tablet  Have you contacted your pharmacy to request a refill? Yes   Which pharmacy would you like this sent to?   [id number] - [place], [place] - [address] Phone: [phone]  Fax: [phone]    Pt stated he is out of his medication  Patient notified that their request is being sent to the clinical staff for review and that they should receive a response within 2 business days.   Please advise at Mobile [phone] (mobile)

## 2023-05-05 ENCOUNTER — Other Ambulatory Visit: Payer: Self-pay | Admitting: Adult Health

## 2023-05-05 MED ORDER — APIXABAN 5 MG PO TABS: 5.0000 mg | ORAL_TABLET | Freq: Two times a day (BID) | ORAL | 3 refills | Status: DC

## 2023-05-05 NOTE — Telephone Encounter (Signed)
Patient notified of update  and verbalized understanding. 

## 2023-05-10 LAB — CUP PACEART REMOTE DEVICE CHECK
Date Time Interrogation Session: 20240831230911
Implantable Pulse Generator Implant Date: 20220714

## 2023-05-16 ENCOUNTER — Ambulatory Visit: Payer: Medicare PPO

## 2023-05-16 DIAGNOSIS — I63412 Cerebral infarction due to embolism of left middle cerebral artery: Secondary | ICD-10-CM | POA: Diagnosis not present

## 2023-06-02 NOTE — Progress Notes (Signed)
Carelink Summary Report / Loop Recorder 

## 2023-06-08 LAB — CUP PACEART REMOTE DEVICE CHECK
Date Time Interrogation Session: 20241001230942
Implantable Pulse Generator Implant Date: 20220714

## 2023-06-10 ENCOUNTER — Other Ambulatory Visit: Payer: Self-pay | Admitting: Adult Health

## 2023-06-10 DIAGNOSIS — Z1212 Encounter for screening for malignant neoplasm of rectum: Secondary | ICD-10-CM

## 2023-06-10 DIAGNOSIS — Z1211 Encounter for screening for malignant neoplasm of colon: Secondary | ICD-10-CM

## 2023-06-20 ENCOUNTER — Ambulatory Visit (INDEPENDENT_AMBULATORY_CARE_PROVIDER_SITE_OTHER): Payer: Medicare PPO

## 2023-06-20 DIAGNOSIS — I63412 Cerebral infarction due to embolism of left middle cerebral artery: Secondary | ICD-10-CM | POA: Diagnosis not present

## 2023-07-04 DIAGNOSIS — H25043 Posterior subcapsular polar age-related cataract, bilateral: Secondary | ICD-10-CM | POA: Diagnosis not present

## 2023-07-06 NOTE — Progress Notes (Signed)
Carelink Summary Report / Loop Recorder 

## 2023-07-11 ENCOUNTER — Other Ambulatory Visit: Payer: Self-pay | Admitting: Adult Health

## 2023-07-11 DIAGNOSIS — I4891 Unspecified atrial fibrillation: Secondary | ICD-10-CM

## 2023-07-11 LAB — CUP PACEART REMOTE DEVICE CHECK
Date Time Interrogation Session: 20241101230841
Implantable Pulse Generator Implant Date: 20220714

## 2023-07-12 NOTE — Telephone Encounter (Signed)
Pt needs a CPE for further refills 

## 2023-07-24 ENCOUNTER — Other Ambulatory Visit: Payer: Self-pay

## 2023-07-24 ENCOUNTER — Inpatient Hospital Stay (HOSPITAL_COMMUNITY)
Admission: EM | Admit: 2023-07-24 | Discharge: 2023-07-28 | DRG: 518 | Disposition: A | Discharge status: Home Health | Payer: Medicare PPO | Attending: Neurosurgery | Admitting: Neurosurgery

## 2023-07-24 ENCOUNTER — Emergency Department (HOSPITAL_COMMUNITY): Payer: Medicare PPO

## 2023-07-24 ENCOUNTER — Encounter (HOSPITAL_COMMUNITY): Payer: Self-pay | Admitting: Emergency Medicine

## 2023-07-24 DIAGNOSIS — Z981 Arthrodesis status: Secondary | ICD-10-CM | POA: Diagnosis not present

## 2023-07-24 DIAGNOSIS — Z79899 Other long term (current) drug therapy: Secondary | ICD-10-CM | POA: Diagnosis not present

## 2023-07-24 DIAGNOSIS — Z885 Allergy status to narcotic agent status: Secondary | ICD-10-CM

## 2023-07-24 DIAGNOSIS — I10 Essential (primary) hypertension: Secondary | ICD-10-CM | POA: Diagnosis present

## 2023-07-24 DIAGNOSIS — F418 Other specified anxiety disorders: Secondary | ICD-10-CM | POA: Diagnosis not present

## 2023-07-24 DIAGNOSIS — G4700 Insomnia, unspecified: Secondary | ICD-10-CM | POA: Diagnosis present

## 2023-07-24 DIAGNOSIS — R55 Syncope and collapse: Secondary | ICD-10-CM | POA: Diagnosis not present

## 2023-07-24 DIAGNOSIS — Z888 Allergy status to other drugs, medicaments and biological substances status: Secondary | ICD-10-CM | POA: Diagnosis not present

## 2023-07-24 DIAGNOSIS — E785 Hyperlipidemia, unspecified: Secondary | ICD-10-CM | POA: Diagnosis present

## 2023-07-24 DIAGNOSIS — I480 Paroxysmal atrial fibrillation: Secondary | ICD-10-CM | POA: Diagnosis present

## 2023-07-24 DIAGNOSIS — G992 Myelopathy in diseases classified elsewhere: Secondary | ICD-10-CM | POA: Diagnosis not present

## 2023-07-24 DIAGNOSIS — Z8249 Family history of ischemic heart disease and other diseases of the circulatory system: Secondary | ICD-10-CM | POA: Diagnosis not present

## 2023-07-24 DIAGNOSIS — R609 Edema, unspecified: Secondary | ICD-10-CM | POA: Diagnosis not present

## 2023-07-24 DIAGNOSIS — R079 Chest pain, unspecified: Secondary | ICD-10-CM | POA: Diagnosis not present

## 2023-07-24 DIAGNOSIS — Z7901 Long term (current) use of anticoagulants: Secondary | ICD-10-CM

## 2023-07-24 DIAGNOSIS — Z6841 Body Mass Index (BMI) 40.0 and over, adult: Secondary | ICD-10-CM | POA: Diagnosis not present

## 2023-07-24 DIAGNOSIS — R202 Paresthesia of skin: Secondary | ICD-10-CM | POA: Diagnosis not present

## 2023-07-24 DIAGNOSIS — G959 Disease of spinal cord, unspecified: Secondary | ICD-10-CM | POA: Diagnosis present

## 2023-07-24 DIAGNOSIS — M48061 Spinal stenosis, lumbar region without neurogenic claudication: Secondary | ICD-10-CM | POA: Diagnosis present

## 2023-07-24 DIAGNOSIS — M4804 Spinal stenosis, thoracic region: Principal | ICD-10-CM | POA: Diagnosis present

## 2023-07-24 DIAGNOSIS — G9519 Other vascular myelopathies: Secondary | ICD-10-CM | POA: Diagnosis present

## 2023-07-24 DIAGNOSIS — E66813 Obesity, class 3: Secondary | ICD-10-CM | POA: Diagnosis present

## 2023-07-24 DIAGNOSIS — F419 Anxiety disorder, unspecified: Secondary | ICD-10-CM | POA: Diagnosis present

## 2023-07-24 DIAGNOSIS — D72829 Elevated white blood cell count, unspecified: Secondary | ICD-10-CM | POA: Diagnosis not present

## 2023-07-24 DIAGNOSIS — E782 Mixed hyperlipidemia: Secondary | ICD-10-CM | POA: Diagnosis not present

## 2023-07-24 DIAGNOSIS — Z800 Family history of malignant neoplasm of digestive organs: Secondary | ICD-10-CM

## 2023-07-24 DIAGNOSIS — G8929 Other chronic pain: Secondary | ICD-10-CM | POA: Diagnosis present

## 2023-07-24 DIAGNOSIS — E669 Obesity, unspecified: Secondary | ICD-10-CM | POA: Diagnosis not present

## 2023-07-24 DIAGNOSIS — F17290 Nicotine dependence, other tobacco product, uncomplicated: Secondary | ICD-10-CM | POA: Diagnosis present

## 2023-07-24 DIAGNOSIS — R531 Weakness: Secondary | ICD-10-CM | POA: Diagnosis not present

## 2023-07-24 DIAGNOSIS — F32A Depression, unspecified: Secondary | ICD-10-CM | POA: Diagnosis not present

## 2023-07-24 DIAGNOSIS — I252 Old myocardial infarction: Secondary | ICD-10-CM

## 2023-07-24 DIAGNOSIS — M48062 Spinal stenosis, lumbar region with neurogenic claudication: Secondary | ICD-10-CM | POA: Diagnosis not present

## 2023-07-24 DIAGNOSIS — Z96651 Presence of right artificial knee joint: Secondary | ICD-10-CM | POA: Diagnosis present

## 2023-07-24 DIAGNOSIS — I63412 Cerebral infarction due to embolism of left middle cerebral artery: Secondary | ICD-10-CM | POA: Diagnosis not present

## 2023-07-24 DIAGNOSIS — G9520 Unspecified cord compression: Secondary | ICD-10-CM | POA: Diagnosis not present

## 2023-07-24 DIAGNOSIS — M5126 Other intervertebral disc displacement, lumbar region: Secondary | ICD-10-CM | POA: Diagnosis not present

## 2023-07-24 DIAGNOSIS — Z8673 Personal history of transient ischemic attack (TIA), and cerebral infarction without residual deficits: Secondary | ICD-10-CM

## 2023-07-24 DIAGNOSIS — R0789 Other chest pain: Secondary | ICD-10-CM | POA: Diagnosis not present

## 2023-07-24 DIAGNOSIS — M129 Arthropathy, unspecified: Secondary | ICD-10-CM | POA: Diagnosis present

## 2023-07-24 DIAGNOSIS — M779 Enthesopathy, unspecified: Secondary | ICD-10-CM | POA: Diagnosis present

## 2023-07-24 LAB — URINALYSIS, ROUTINE W REFLEX MICROSCOPIC
Bacteria, UA: NONE SEEN
Bilirubin Urine: NEGATIVE
Glucose, UA: NEGATIVE mg/dL
Hgb urine dipstick: NEGATIVE
Ketones, ur: NEGATIVE mg/dL
Nitrite: NEGATIVE
Protein, ur: NEGATIVE mg/dL
Specific Gravity, Urine: 1.017 (ref 1.005–1.030)
pH: 5 (ref 5.0–8.0)

## 2023-07-24 LAB — CBC
HCT: 44.3 % (ref 39.0–52.0)
Hemoglobin: 14.7 g/dL (ref 13.0–17.0)
MCH: 29.8 pg (ref 26.0–34.0)
MCHC: 33.2 g/dL (ref 30.0–36.0)
MCV: 89.7 fL (ref 80.0–100.0)
Platelets: 311 10*3/uL (ref 150–400)
RBC: 4.94 MIL/uL (ref 4.22–5.81)
RDW: 13.3 % (ref 11.5–15.5)
WBC: 11.5 10*3/uL — ABNORMAL HIGH (ref 4.0–10.5)
nRBC: 0 % (ref 0.0–0.2)

## 2023-07-24 LAB — TROPONIN I (HIGH SENSITIVITY): Troponin I (High Sensitivity): 7 ng/L (ref <18)

## 2023-07-24 LAB — BASIC METABOLIC PANEL
Anion gap: 11 (ref 5–15)
BUN: 18 mg/dL (ref 8–23)
CO2: 24 mmol/L (ref 22–32)
Calcium: 9.6 mg/dL (ref 8.9–10.3)
Chloride: 102 mmol/L (ref 98–111)
Creatinine, Ser: 1.08 mg/dL (ref 0.61–1.24)
GFR, Estimated: >60 mL/min (ref >60)
Glucose, Bld: 146 mg/dL — ABNORMAL HIGH (ref 70–99)
Potassium: 3.8 mmol/L (ref 3.5–5.1)
Sodium: 137 mmol/L (ref 135–145)

## 2023-07-24 LAB — CBG MONITORING, ED: Glucose-Capillary: 163 mg/dL — ABNORMAL HIGH (ref 70–99)

## 2023-07-24 LAB — I-STAT CG4 LACTIC ACID, ED: Lactic Acid, Venous: 1.4 mmol/L (ref 0.5–1.9)

## 2023-07-24 NOTE — ED Provider Triage Note (Signed)
Emergency Medicine Provider Triage Evaluation Note  Cristian Gonzales , a 67 y.o. male  was evaluated in triage.  Pt complains of 1 week of progressive worsening tingling/numbness of bilateral lower extremities radiating up, now affecting hands.  Patient reports that he is having more stumbling, unsteadiness with gait, who reports that with falls this week he has been very emotional, quick to tears.  Does have a history of stroke, was given tPA, reports right sided arm weakness at that time, reports that this feels dissimilar from previous stroke.  Additionally with history of NSTEMI last year, reportedly went into abnormal heart rhythm while mowing the lawn and 100 degree heat, he endorses some chest pain today which feels like tightness.  He denies worse with exertion.  He does not feel like his heart is racing.  Review of Systems  Positive: Chest pain, numbness, falls Negative: Shob, fever, chills, weakness, confusion  Physical Exam  BP (!) 144/94   Pulse 92   Temp 98.1 F (36.7 C)   Resp 18   SpO2 98%  Gen:   Awake, no distress   Resp:  Normal effort  MSK:   Moves extremities without difficulty  Other:  Moves all 4 limbs spontaneously, CN II through XII grossly intact, can ambulate without much difficulty, intact sensation throughout.  Medical Decision Making  Medically screening exam initiated at 8:45 PM.  Appropriate orders placed.  Donovan Logeman Schorr was informed that the remainder of the evaluation will be completed by another provider, this initial triage assessment does not replace that evaluation, and the importance of remaining in the ED until their evaluation is complete.  Workup initiated in triage   Cristian Gonzales, [place] 07/24/23 2045

## 2023-07-24 NOTE — ED Triage Notes (Signed)
Pt here from home with c/o chest pain over the last week worse today also with c/o bil leg weakness that has been radiating up to his arm

## 2023-07-25 ENCOUNTER — Ambulatory Visit: Payer: Medicare PPO | Admitting: Family Medicine

## 2023-07-25 ENCOUNTER — Emergency Department (HOSPITAL_COMMUNITY): Payer: Medicare PPO

## 2023-07-25 ENCOUNTER — Ambulatory Visit (INDEPENDENT_AMBULATORY_CARE_PROVIDER_SITE_OTHER): Payer: Medicare PPO

## 2023-07-25 ENCOUNTER — Other Ambulatory Visit: Payer: Self-pay | Admitting: Neurosurgery

## 2023-07-25 ENCOUNTER — Other Ambulatory Visit: Payer: Self-pay

## 2023-07-25 ENCOUNTER — Other Ambulatory Visit: Payer: Self-pay | Admitting: Adult Health

## 2023-07-25 DIAGNOSIS — I480 Paroxysmal atrial fibrillation: Secondary | ICD-10-CM | POA: Diagnosis present

## 2023-07-25 DIAGNOSIS — Z8249 Family history of ischemic heart disease and other diseases of the circulatory system: Secondary | ICD-10-CM | POA: Diagnosis not present

## 2023-07-25 DIAGNOSIS — M5126 Other intervertebral disc displacement, lumbar region: Secondary | ICD-10-CM | POA: Diagnosis not present

## 2023-07-25 DIAGNOSIS — E782 Mixed hyperlipidemia: Secondary | ICD-10-CM | POA: Diagnosis not present

## 2023-07-25 DIAGNOSIS — M48061 Spinal stenosis, lumbar region without neurogenic claudication: Secondary | ICD-10-CM | POA: Diagnosis not present

## 2023-07-25 DIAGNOSIS — Z800 Family history of malignant neoplasm of digestive organs: Secondary | ICD-10-CM | POA: Diagnosis not present

## 2023-07-25 DIAGNOSIS — R609 Edema, unspecified: Secondary | ICD-10-CM | POA: Diagnosis not present

## 2023-07-25 DIAGNOSIS — E669 Obesity, unspecified: Secondary | ICD-10-CM | POA: Diagnosis not present

## 2023-07-25 DIAGNOSIS — Z885 Allergy status to narcotic agent status: Secondary | ICD-10-CM | POA: Diagnosis not present

## 2023-07-25 DIAGNOSIS — D72829 Elevated white blood cell count, unspecified: Secondary | ICD-10-CM | POA: Diagnosis present

## 2023-07-25 DIAGNOSIS — G8929 Other chronic pain: Secondary | ICD-10-CM | POA: Diagnosis present

## 2023-07-25 DIAGNOSIS — M4804 Spinal stenosis, thoracic region: Secondary | ICD-10-CM | POA: Diagnosis not present

## 2023-07-25 DIAGNOSIS — G4700 Insomnia, unspecified: Secondary | ICD-10-CM | POA: Diagnosis present

## 2023-07-25 DIAGNOSIS — I10 Essential (primary) hypertension: Secondary | ICD-10-CM

## 2023-07-25 DIAGNOSIS — Z981 Arthrodesis status: Secondary | ICD-10-CM | POA: Diagnosis not present

## 2023-07-25 DIAGNOSIS — F418 Other specified anxiety disorders: Secondary | ICD-10-CM | POA: Diagnosis not present

## 2023-07-25 DIAGNOSIS — G992 Myelopathy in diseases classified elsewhere: Secondary | ICD-10-CM | POA: Diagnosis not present

## 2023-07-25 DIAGNOSIS — I63412 Cerebral infarction due to embolism of left middle cerebral artery: Secondary | ICD-10-CM | POA: Diagnosis not present

## 2023-07-25 DIAGNOSIS — M129 Arthropathy, unspecified: Secondary | ICD-10-CM | POA: Diagnosis present

## 2023-07-25 DIAGNOSIS — E66813 Obesity, class 3: Secondary | ICD-10-CM | POA: Diagnosis present

## 2023-07-25 DIAGNOSIS — Z6841 Body Mass Index (BMI) 40.0 and over, adult: Secondary | ICD-10-CM | POA: Diagnosis not present

## 2023-07-25 DIAGNOSIS — F17290 Nicotine dependence, other tobacco product, uncomplicated: Secondary | ICD-10-CM | POA: Diagnosis present

## 2023-07-25 DIAGNOSIS — F419 Anxiety disorder, unspecified: Secondary | ICD-10-CM | POA: Diagnosis present

## 2023-07-25 DIAGNOSIS — Z888 Allergy status to other drugs, medicaments and biological substances status: Secondary | ICD-10-CM | POA: Diagnosis not present

## 2023-07-25 DIAGNOSIS — F32A Depression, unspecified: Secondary | ICD-10-CM | POA: Diagnosis present

## 2023-07-25 DIAGNOSIS — E785 Hyperlipidemia, unspecified: Secondary | ICD-10-CM | POA: Diagnosis present

## 2023-07-25 DIAGNOSIS — G959 Disease of spinal cord, unspecified: Secondary | ICD-10-CM | POA: Diagnosis not present

## 2023-07-25 DIAGNOSIS — G9520 Unspecified cord compression: Secondary | ICD-10-CM | POA: Diagnosis not present

## 2023-07-25 DIAGNOSIS — G9519 Other vascular myelopathies: Secondary | ICD-10-CM | POA: Diagnosis not present

## 2023-07-25 DIAGNOSIS — M48062 Spinal stenosis, lumbar region with neurogenic claudication: Secondary | ICD-10-CM | POA: Diagnosis not present

## 2023-07-25 DIAGNOSIS — Z79899 Other long term (current) drug therapy: Secondary | ICD-10-CM | POA: Diagnosis not present

## 2023-07-25 DIAGNOSIS — I4891 Unspecified atrial fibrillation: Secondary | ICD-10-CM

## 2023-07-25 DIAGNOSIS — I252 Old myocardial infarction: Secondary | ICD-10-CM | POA: Diagnosis not present

## 2023-07-25 DIAGNOSIS — Z7901 Long term (current) use of anticoagulants: Secondary | ICD-10-CM | POA: Diagnosis not present

## 2023-07-25 DIAGNOSIS — R079 Chest pain, unspecified: Secondary | ICD-10-CM | POA: Diagnosis not present

## 2023-07-25 LAB — HIV ANTIBODY (ROUTINE TESTING W REFLEX): HIV Screen 4th Generation wRfx: NONREACTIVE

## 2023-07-25 LAB — TROPONIN I (HIGH SENSITIVITY): Troponin I (High Sensitivity): 7 ng/L (ref <18)

## 2023-07-25 LAB — MAGNESIUM: Magnesium: 2.2 mg/dL (ref 1.7–2.4)

## 2023-07-25 MED ORDER — SODIUM CHLORIDE 0.9% FLUSH
3.0000 mL | Freq: Two times a day (BID) | INTRAVENOUS | Status: DC
  Administered 2023-07-25 – 2023-07-27 (×4): 3 mL via INTRAVENOUS

## 2023-07-25 MED ORDER — ONDANSETRON HCL 4 MG/2ML IJ SOLN: 4.0000 mg | Freq: Four times a day (QID) | INTRAMUSCULAR | Status: DC | PRN

## 2023-07-25 MED ORDER — CARVEDILOL 3.125 MG PO TABS
3.1250 mg | ORAL_TABLET | Freq: Two times a day (BID) | ORAL | Status: DC
  Administered 2023-07-25 – 2023-07-26 (×4): 3.125 mg via ORAL
  Filled 2023-07-25 (×5): qty 1

## 2023-07-25 MED ORDER — DEXAMETHASONE SODIUM PHOSPHATE 4 MG/ML IJ SOLN
4.0000 mg | Freq: Four times a day (QID) | INTRAMUSCULAR | Status: DC
  Administered 2023-07-25 – 2023-07-28 (×13): 4 mg via INTRAVENOUS
  Filled 2023-07-25 (×13): qty 1

## 2023-07-25 MED ORDER — ACETAMINOPHEN 325 MG PO TABS
650.0000 mg | ORAL_TABLET | Freq: Four times a day (QID) | ORAL | Status: DC | PRN
  Administered 2023-07-25: 650 mg via ORAL
  Filled 2023-07-25: qty 2

## 2023-07-25 MED ORDER — DIPHENHYDRAMINE HCL 25 MG PO CAPS
25.0000 mg | ORAL_CAPSULE | Freq: Once | ORAL | Status: AC | PRN
  Administered 2023-07-25: 25 mg via ORAL
  Filled 2023-07-25: qty 1

## 2023-07-25 MED ORDER — ONDANSETRON HCL 4 MG PO TABS: 4.0000 mg | ORAL_TABLET | Freq: Four times a day (QID) | ORAL | Status: DC | PRN

## 2023-07-25 MED ORDER — ACETAMINOPHEN 650 MG RE SUPP: 650.0000 mg | Freq: Four times a day (QID) | RECTAL | Status: DC | PRN

## 2023-07-25 MED ORDER — ALBUTEROL SULFATE (2.5 MG/3ML) 0.083% IN NEBU: 2.5000 mg | INHALATION_SOLUTION | Freq: Four times a day (QID) | RESPIRATORY_TRACT | Status: DC | PRN

## 2023-07-25 MED ORDER — BUPROPION HCL ER (XL) 150 MG PO TB24
150.0000 mg | ORAL_TABLET | Freq: Every day | ORAL | Status: DC
  Administered 2023-07-25 – 2023-07-28 (×3): 150 mg via ORAL
  Filled 2023-07-25 (×3): qty 1

## 2023-07-25 MED ORDER — HEPARIN (PORCINE) 25000 UT/250ML-% IV SOLN
1500.0000 [IU]/h | INTRAVENOUS | Status: DC
  Administered 2023-07-25: 1200 [IU]/h via INTRAVENOUS
  Administered 2023-07-26: 1500 [IU]/h via INTRAVENOUS
  Filled 2023-07-25 (×2): qty 250

## 2023-07-25 MED ORDER — GADOBUTROL 1 MMOL/ML IV SOLN
10.0000 mL | Freq: Once | INTRAVENOUS | Status: AC | PRN
  Administered 2023-07-25: 10 mL via INTRAVENOUS
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
## 2023-07-25 NOTE — Evaluation (Signed)
Physical Therapy Evaluation Patient Details Name: Cristian Gonzales MRN: [id number] DOB: 1956-01-16 Today's Date: 07/25/2023  History of Present Illness  Patient is 67 y.o. male presented to ED with c/o chest pain and bil LE weakness with pain in UE's. Pt reports bil LE numbness & tingling. CT of head normal. MRI of lumbar spine shows severe spinal stenosis at T11-T12 with cord compression. He has chronic severe spinal stenosis from L2-L5 as well. Neurosurgery consulted and feels he will benefit from T11-12 and will need a laminectomy for decompression.   Clinical Impression  Cristian Gonzales is 67 y.o. male admitted with above HPI and diagnosis. Patient is currently limited by functional impairments below (see PT problem list). Patient lives with spouse and is independent with no AD for mobility at baseline however has had 2 falls the week of admission due to progressing LE weakness. Patient will benefit from continued skilled PT interventions to address impairments and progress independence with mobility, recommending intensive inpatient follow up therapy, >3 hours/day. Acute PT will follow and progress as able.         If plan is discharge home, recommend the following:     Can travel by private vehicle        Equipment Recommendations Rolling walker (2 wheels);Wheelchair (measurements PT);Wheelchair cushion (measurements PT);BSC/3in1 (TBD at next venue)  Recommendations for Other Services  Rehab consult;OT consult    Functional Status Assessment Patient has had a recent decline in their functional status and demonstrates the ability to make significant improvements in function in a reasonable and predictable amount of time.     Precautions / Restrictions Precautions Precautions: Fall Restrictions Weight Bearing Restrictions: No      Mobility  Bed Mobility Overal bed mobility: Needs Assistance Bed Mobility: Rolling, Supine to Sit, Sit to Supine Rolling: Supervision, Used  rails   Supine to sit: Min assist, HOB elevated, Used rails Sit to supine: Used rails, HOB elevated, Contact guard assist   General bed mobility comments: pt reliant on use of bed features for rolling, sup for safety. pt requires min assist to fuly raise trunk upright to sit EOB and able to lower trunk and bring LE's onto bed to return to supine.    Transfers Overall transfer level: Needs assistance Equipment used: Rolling walker (2 wheels) Transfers: Sit to/from Stand Sit to Stand: Contact guard assist           General transfer comment: clsoe CGA for safety, cues needed to use RW. pt flopping back to sit with no reach back to control lowering. reports hip pain with sitting.    Ambulation/Gait Ambulation/Gait assistance: Min assist, Contact guard assist Gait Distance (Feet): 60 Feet (2x) Assistive device: Rolling walker (2 wheels) Gait Pattern/deviations: Step-through pattern, Decreased step length - right, Decreased dorsiflexion - right Gait velocity: decr     General Gait Details: cues for safe walker management and min assist initially to maintain. cues for safe pace as pt tends to walk quickly. seated rest after ~ 60' as Rt LE dragging slightly with decreased hip flex and DF.  Stairs            Wheelchair Mobility     Tilt Bed    Modified Rankin (Stroke Patients Only)       Balance Overall balance assessment: Needs assistance Sitting-balance support: No upper extremity supported, Feet supported Sitting balance-Leahy Scale: Good     Standing balance support: During functional activity, Reliant on assistive device for balance, Bilateral upper extremity supported  Standing balance-Leahy Scale: Poor Standing balance comment: 2 falls week of admission, reliant on RW for support                             Pertinent Vitals/Pain      Home Living Family/patient expects to be discharged to:: Private residence Living Arrangements:  Spouse/significant other Available Help at Discharge: Family Type of Home: House Home Access: Stairs to enter   Secretary/administrator of Steps: 1   Home Layout: One level Home Equipment: None      Prior Function Prior Level of Function : Independent/Modified Independent             Mobility Comments: ind with no AD, 2 falls in last week ADLs Comments: ind with ADL's. pt has own technique for donning sock (tripod stance with head on bed)     Extremity/Trunk Assessment   Upper Extremity Assessment Upper Extremity Assessment: Overall WFL for tasks assessed    Lower Extremity Assessment Lower Extremity Assessment: RLE deficits/detail;LLE deficits/detail RLE Deficits / Details: hip flex 3+/5; quad 4+/5; hamstring 4/5; DF 3+/5; PF 4-/5 RLE Sensation: decreased light touch (pins/needles; tingling) RLE Coordination: decreased gross motor (greatly limited by ROM deficits) LLE Deficits / Details: hip flex 4/5; quad & hamstring 4+/5; DF 4+/5; PF 4+/5 LLE Sensation: decreased light touch (pins/needles; tingling) LLE Coordination: WNL    Cervical / Trunk Assessment Cervical / Trunk Assessment: Normal  Communication   Communication Communication: No apparent difficulties  Cognition Arousal: Alert Behavior During Therapy: WFL for tasks assessed/performed Overall Cognitive Status: Within Functional Limits for tasks assessed                                 General Comments: slightly flat however likely baseline personality/sense of humor. Overall pt cognitively intact, reports some ongoing difficulties with his emotions since CVA in 2022 reporting sometimes gets irritable or emotional/sad more easily than he used to.        General Comments      Exercises     Assessment/Plan    PT Assessment Patient needs continued PT services  PT Problem List Decreased strength;Decreased range of motion;Decreased activity tolerance;Decreased balance;Decreased  mobility;Decreased coordination;Decreased knowledge of use of DME;Decreased safety awareness;Decreased knowledge of precautions;Cardiopulmonary status limiting activity;Obesity;Impaired sensation       PT Treatment Interventions DME instruction;Gait training;Stair training;Functional mobility training;Therapeutic activities;Therapeutic exercise;Balance training;Neuromuscular re-education;Cognitive remediation;Patient/family education;Wheelchair mobility training    PT Goals (Current goals can be found in the Care Plan section)  Acute Rehab PT Goals Patient Stated Goal: recover and maintain independence PT Goal Formulation: With patient/family Time For Goal Achievement: 08/08/23 Potential to Achieve Goals: Good    Frequency Min 1X/week     Co-evaluation               AM-PAC PT "6 Clicks" Mobility  Outcome Measure Help needed turning from your back to your side while in a flat bed without using bedrails?: A Little Help needed moving from lying on your back to sitting on the side of a flat bed without using bedrails?: A Little Help needed moving to and from a bed to a chair (including a wheelchair)?: A Little Help needed standing up from a chair using your arms (e.g., wheelchair or bedside chair)?: A Little Help needed to walk in hospital room?: A Little Help needed climbing 3-5 steps with a railing? : Total  6 Click Score: 16    End of Session Equipment Utilized During Treatment: Gait belt Activity Tolerance: Patient tolerated treatment well Patient left: in bed;with call bell/phone within reach;with bed alarm set;with family/visitor present (seated EOB) Nurse Communication: Mobility status PT Visit Diagnosis: Unsteadiness on feet (R26.81);Other abnormalities of gait and mobility (R26.89);Muscle weakness (generalized) (M62.81);Difficulty in walking, not elsewhere classified (R26.2);Other symptoms and signs involving the nervous system (R29.898)    Time: 1610-9604 PT Time  Calculation (min) (ACUTE ONLY): 28 min   Charges:   PT Evaluation $PT Eval Moderate Complexity: 1 Mod PT Treatments $Gait Training: 8-22 mins PT General Charges $$ ACUTE PT VISIT: 1 Visit         Wynn Maudlin, DPT Acute Rehabilitation Services Office [phone]  07/25/23 5:11 PM

## 2023-07-25 NOTE — Progress Notes (Addendum)
Presents with c/o bilateral.LE weakness.Hx of  embolic CVA, PAF on Eliquis.   07/25/23 1403  TOC Brief Assessment  Insurance and Status Reviewed  Patient has primary care physician Yes  Home environment has been reviewed From home with wife  Prior level of function: PTA independent with ADL's, no DME usage  Prior/Current Home Services No current home services  Social Determinants of Health Reivew SDOH reviewed no interventions necessary  Readmission risk has been reviewed No  Transition of care needs transition of care needs identified, TOC will continue to follow (OR scheduled for 11/20)   Plan: laminectomy for decompression scheduled for Wednesday afternoon . Pt needs to be off of eliquis for at least 3 days.   TOC team monitoring and will assist with needs. Gae Gallop RN,BSN,CM [phone]

## 2023-07-25 NOTE — Discharge Instructions (Addendum)
Information on my medicine - ELIQUIS (apixaban)-You were taking this medication prior to this hospital admission. Follow Neurology surgeon's instruction to restart Eliquis in 3 days.   Why was Eliquis prescribed for you? (Taking prior to admission) Eliquis was prescribed for you to reduce the risk of a blood clot forming that can cause a stroke if you have a medical condition called atrial fibrillation (a type of irregular heartbeat).  What do You need to know about Eliquis ? Take your Eliquis TWICE DAILY - one tablet in the morning and one tablet in the evening with or without food. If you have difficulty swallowing the tablet whole please discuss with your pharmacist how to take the medication safely.  Take Eliquis exactly as prescribed by your doctor and DO NOT stop taking Eliquis without talking to the doctor who prescribed the medication.  Stopping may increase your risk of developing a stroke.  Refill your prescription before you run out.  After discharge, you should have regular check-up appointments with your healthcare provider that is prescribing your Eliquis.  In the future your dose may need to be changed if your kidney function or weight changes by a significant amount or as you get older.  What do you do if you miss a dose? If you miss a dose, take it as soon as you remember on the same day and resume taking twice daily.  Do not take more than one dose of ELIQUIS at the same time to make up a missed dose.  Important Safety Information A possible side effect of Eliquis is bleeding. You should call your healthcare provider right away if you experience any of the following: Bleeding from an injury or your nose that does not stop. Unusual colored urine (red or dark brown) or unusual colored stools (red or black). Unusual bruising for unknown reasons. A serious fall or if you hit your head (even if there is no bleeding).  Some medicines may interact with Eliquis and might  increase your risk of bleeding or clotting while on Eliquis. To help avoid this, consult your healthcare provider or pharmacist prior to using any new prescription or non-prescription medications, including herbals, vitamins, non-steroidal anti-inflammatory drugs (NSAIDs) and supplements.  This website has more information on Eliquis (apixaban): http://[email or web address].com/eliquis/home
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
## 2023-07-25 NOTE — ED Provider Notes (Signed)
Received patient in turnover from Dr. Bebe Shaggy.  Please see their note for further details of Hx, PE.  Briefly patient is a 67 y.o. male with a Chest Pain and Weakness .  Patient here with bilateral leg weakness.  Going on for the course of the week.  A lot of trouble going up stairs or up ramps.  He thinks is gotten progressively worse over the past week and he is in a lot of trouble walking recently.  Awaiting MRI.  MRI is resulted with cord compression at T11-T12, there is also some signs of cord edema at T10 and T11.  Plan to discuss with neurosurgery.  Patient evaluated by neurosurgery, they do think that he needs operative repair unfortunately that he is on Eliquis.  Plan for medical admission PT with planned operative repair on Wednesday.  Will discuss with medicine.    Melene Plan, DO 07/25/23 1025

## 2023-07-25 NOTE — ED Notes (Signed)
Patient transported to MRI 

## 2023-07-25 NOTE — Consult Note (Signed)
Reason for Consult:leg weakness Referring Physician: EDP  JAYRO CASTNER is an 67 y.o. male.   HPI:  67 year old male presented to the ED today after a week of feeling like his legs are giving out from under him. He states that this has progressively gotten worse over the week. Endorses numbness and tingling in both of his legs but no pain. He has been unsteady on his feet. Has a history of stroke and is on eliquis. He last took his eliquis yesterday. He has mri of his thoracic and lumbar spine this summer because of back pain but didn't have any weakness then.   Past Medical History:  Diagnosis Date   Anxiety    Arthritis    Cervical spine fracture (HCC)    13 -diving accident, no neurolgic sequelae   Depression    Paroxysmal atrial fibrillation (HCC)    Stroke (HCC)    Syncope 11/27/2012   From Tramadol    Past Surgical History:  Procedure Laterality Date   BUBBLE STUDY  03/19/2021   Procedure: BUBBLE STUDY;  Surgeon: Wendall Stade, MD;  Location: [facility] ENDOSCOPY;  Service: Cardiovascular;;   CERVICAL DISCECTOMY  2004   dr Channing Mutters   KNEE SURGERY  1981   right   LOOP RECORDER INSERTION N/A 03/19/2021   Procedure: LOOP RECORDER INSERTION;  Surgeon: Lanier Prude, MD;  Location: [facility] INVASIVE CV LAB;  Service: Cardiovascular;  Laterality: N/A;   NECK SURGERY     REPLACEMENT TOTAL KNEE     right   REPLACEMENT TOTAL KNEE     TEE WITHOUT CARDIOVERSION N/A 03/19/2021   Procedure: TRANSESOPHAGEAL ECHOCARDIOGRAM (TEE);  Surgeon: Wendall Stade, MD;  Location: [facility] ENDOSCOPY;  Service: Cardiovascular;  Laterality: N/A;    Allergies  Allergen Reactions   Tramadol Other (See Comments)    Passed out   Metoprolol Other (See Comments)    " Made my head feel like it was going to blow up"    Morphine Other (See Comments)    Left red bumps on the back and did not work   Trazodone And Nefazodone Other (See Comments)    Made the patient feel flushed,with blurred vision and muscle cramps      Social History   Tobacco Use   Smoking status: Some Days    Types: Cigars    Last attempt to quit: 01/04/2010    Years since quitting: 13.5   Smokeless tobacco: Never   Tobacco comments:    Smokes 1 cigar per day  Substance Use Topics   Alcohol use: No    Family History  Problem Relation Age of Onset   Heart attack Father 24       Sudden Cardiac Death   Liver cancer Mother 85     Review of Systems  Positive ROS: as above  All other systems have been reviewed and were otherwise negative with the exception of those mentioned in the HPI and as above.  Objective: Vital signs in last 24 hours: Temp:  [98.1 F (36.7 C)-98.4 F (36.9 C)] 98.1 F (36.7 C) (11/18 0959) Pulse Rate:  [55-92] 66 (11/18 0928) Resp:  [14-23] 17 (11/18 0928) BP: (107-149)/(61-94) 138/73 (11/18 0928) SpO2:  [93 %-98 %] 98 % (11/18 0928)  General Appearance: Alert, cooperative, no distress, appears stated age Head: Normocephalic, without obvious abnormality, atraumatic Eyes: PERRL, conjunctiva/corneas clear, EOM's intact, fundi benign, both eyes      Neck: Supple, symmetrical, trachea midline, no adenopathy; thyroid: No  enlargement/tenderness/nodules; no carotid bruit or JVD Back: Symmetric, no curvature, ROM normal, no CVA tenderness Lungs: respirations unlabored Heart: Regular rate and rhythm Extremities: Extremities normal, atraumatic, no cyanosis or edema Pulses: 2+ and symmetric all extremities Skin: Skin color, texture, turgor normal, no rashes or lesions  NEUROLOGIC:   Mental status: A&O x4, no aphasia, good attention span, Memory and fund of knowledge Motor Exam - grossly normal, normal tone and bulk Sensory Exam - grossly normal Reflexes: symmetric, no pathologic reflexes, No Hoffman's, No clonus Coordination - grossly normal Gait - grossly normal Balance - grossly normal Cranial Nerves: I: smell Not tested  II: visual acuity  OS: na    OD: na  II: visual fields Full to  confrontation  II: pupils Equal, round, reactive to light  III,VII: ptosis None  III,IV,VI: extraocular muscles  Full ROM  V: mastication   V: facial light touch sensation    V,VII: corneal reflex    VII: facial muscle function - upper    VII: facial muscle function - lower   VIII: hearing   IX: soft palate elevation    IX,X: gag reflex   XI: trapezius strength    XI: sternocleidomastoid strength   XI: neck flexion strength    XII: tongue strength      Data Review Lab Results  Component Value Date   WBC 11.5 (H) 07/24/2023   HGB 14.7 07/24/2023   HCT 44.3 07/24/2023   MCV 89.7 07/24/2023   PLT 311 07/24/2023   Lab Results  Component Value Date   NA 137 07/24/2023   K 3.8 07/24/2023   CL 102 07/24/2023   CO2 24 07/24/2023   BUN 18 07/24/2023   CREATININE 1.08 07/24/2023   GLUCOSE 146 (H) 07/24/2023   Lab Results  Component Value Date   INR 1.0 02/06/2023    Radiology: MR Lumbar Spine W Wo Contrast  Result Date: 07/25/2023 CLINICAL DATA:  One week progressive worsening tingling and numbness in the bilateral lower extremities. EXAM: MRI LUMBAR SPINE WITHOUT AND WITH CONTRAST TECHNIQUE: Multiplanar and multiecho pulse sequences of the lumbar spine were obtained without and with intravenous contrast. CONTRAST:  10mL GADAVIST GADOBUTROL 1 MMOL/ML IV SOLN COMPARISON:  02/18/2023 FINDINGS: Segmentation:  Standard. Alignment:  Physiologic. Vertebrae:  No fracture, evidence of discitis, or bone lesion. Conus medullaris and cauda equina: Conus extends to the L2 level. Edematous signal in the cord at T11-12. There may be a new level of cord edema at T10-11, limited by partial coverage. Edema at this upper level would be new from prior. There is cauda equina redundancy from the degree of severe spinal stenosis. Paraspinal and other soft tissues: No perispinal mass or inflammation Disc levels: T10-11: Spondylosis and asymmetric left facet spurring with moderate left foraminal  narrowing. The spinal canal appears patent on sagittal images. T11-12: Disc narrowing with central protrusion. Facet spurring ligamentum flavum thickening causes cord compression and T2 hyperintensity. Moderate left foraminal narrowing. T12- L1: Mild facet spurring L1-L2: Moderate facet spurring. Tiny bilateral paracentral protrusion L2-L3: Disc narrowing and bulging with endplate ridging. Degenerative facet spurring on both sides. Advanced thecal sac narrowing. L3-L4: Degenerative facet spurring and circumferential disc bulging with short pedicles causes compressive thecal sac stenosis. L4-L5: Central protrusion and circumferential disc bulging. Bulky degenerative facet spurring and ligamentum flavum thickening. Advanced spinal stenosis. Moderate left foraminal impingement L5-S1:Bulging disc and facet spurring asymmetric to the left where there is foraminal impingement. Mild to moderate right foraminal narrowing mainly from facet spurring. Patent  spinal canal. IMPRESSION: 1. Chronic, degenerative cord compression at T11-12. Cord edema is seen at this level. There may also be cord edema at the T10-11 level, which would be new from June 2024. Suggest thoracic imaging given there is history of proximal deficits. 2. Degeneration and short pedicles causes compressive spinal stenosis at L2-3 to L4-5. 3. Foraminal impingement on the left at T10-11, T11-12, L4-5, and L5-S1. Electronically Signed   By: Tiburcio Pea M.D.   On: 07/25/2023 07:01   DG Chest Portable 1 View  Result Date: 07/25/2023 CLINICAL DATA:  Chest pain EXAM: PORTABLE CHEST 1 VIEW COMPARISON:  02/06/2023 FINDINGS: The heart size and mediastinal contours are within normal limits. Both lungs are clear. The visualized skeletal structures are unremarkable. Loop recorder is noted. IMPRESSION: No acute abnormality noted. Electronically Signed   By: Alcide Clever M.D.   On: 07/25/2023 03:26   CT HEAD WO CONTRAST  Result Date: 07/24/2023 CLINICAL DATA:   Chest pain and syncopal episode, initial encounter EXAM: CT HEAD WITHOUT CONTRAST TECHNIQUE: Contiguous axial images were obtained from the base of the skull through the vertex without intravenous contrast. RADIATION DOSE REDUCTION: This exam was performed according to the departmental dose-optimization program which includes automated exposure control, adjustment of the mA and/or kV according to patient size and/or use of iterative reconstruction technique. COMPARISON:  03/17/2021 FINDINGS: Brain: No evidence of acute infarction, hemorrhage, hydrocephalus, extra-axial collection or mass lesion/mass effect. Vascular: No hyperdense vessel or unexpected calcification. Skull: Normal. Negative for fracture or focal lesion. Sinuses/Orbits: No acute finding. Other: None. IMPRESSION: No acute intracranial abnormality noted. Electronically Signed   By: Alcide Clever M.D.   On: 07/24/2023 21:55     Assessment/Plan: 67 year old male who came in with leg weakness over the last week. MRI shows severe spinal stenosis at T11-T12 with cord compression. He has chronic severe spinal stenosis from L2-L5 as well. I think he's likely most symptomatic from T11-T12 and will need a laminectomy for decompression. However, he needs to be off of his eliquis for at least 3 days. We will plan for surgical decompression on Wednesday afternoon. Admit to hospitalist for therapy and steroids.    Tiana Loft [facility] 07/25/2023 10:38 AM

## 2023-07-25 NOTE — Progress Notes (Signed)
PHARMACY - ANTICOAGULATION CONSULT NOTE  Pharmacy Consult for heparin Indication: atrial fibrillation  Allergies  Allergen Reactions   Tramadol Other (See Comments)    Passed out   Metoprolol Other (See Comments)    " Made my head feel like it was going to blow up"    Morphine Other (See Comments)    Left red bumps on the back and did not work   Trazodone And Nefazodone Other (See Comments)    Made the patient feel flushed,with blurred vision and muscle cramps     Patient Measurements: Height: 5\' 5"  (165.1 cm) Weight: 108.5 kg (239 lb 3.2 oz) IBW/kg (Calculated) : 61.5 Heparin Dosing Weight: 86kg  Vital Signs: Temp: 97.7 F (36.5 C) (11/18 1400) Temp Source: Oral (11/18 1400) BP: 125/76 (11/18 1400) Pulse Rate: 74 (11/18 1400)  Labs: Recent Labs    07/24/23 2041 07/24/23 2253  HGB 14.7  --   HCT 44.3  --   PLT 311  --   CREATININE 1.08  --   TROPONINIHS 7 7    Estimated Creatinine Clearance: 75.4 mL/min (by C-G formula based on SCr of 1.08 mg/dL).   Medical History: Past Medical History:  Diagnosis Date   Anxiety    Arthritis    Cervical spine fracture (HCC)    13 -diving accident, no neurolgic sequelae   Depression    Paroxysmal atrial fibrillation (HCC)    Stroke (HCC)    Syncope 11/27/2012   From Tramadol    Assessment: 80 YOM presenting with bilateral leg weakness, spinal stenosis on MRI with plan for surgical decompression, hx of Afib on Eliquis PTA to be on hold, pharmacy consulted for heparin, last dose taken 11/17 @0700   Goal of Therapy:  Heparin level 0.3-0.7 units/ml aPTT 66-102 seconds Monitor platelets by anticoagulation protocol: Yes   Plan:  Heparin gtt at 1200 units/hr, no bolus F/u 6 hour aPTT/HL   Daylene Posey, PharmD, [facility] Clinical Pharmacist ED Pharmacist Phone # [phone] 07/25/2023 6:39 PM

## 2023-07-25 NOTE — ED Notes (Signed)
ED TO INPATIENT HANDOFF REPORT  ED Nurse Name and Phone #: Gustavo Lah, rn [phone]  S Name/Age/Gender Cristian [place] 67 y.o. male Room/Bed: 011C/011C  Code Status   Code Status: Full Code  Home/SNF/Other Home Patient oriented to: self, place, time, and situation Is this baseline? Yes   Triage Complete: Triage complete  Chief Complaint Weakness [R53.1]  Triage Note Pt here from home with c/o chest pain over the last week worse today also with c/o bil leg weakness that has been radiating up to his arm    Allergies Allergies  Allergen Reactions   Tramadol Other (See Comments)    Passed out   Metoprolol Other (See Comments)    " Made my head feel like it was going to blow up"    Morphine Other (See Comments)    Left red bumps on the back and did not work   Trazodone And Nefazodone Other (See Comments)    Made the patient feel flushed,with blurred vision and muscle cramps     Level of Care/Admitting Diagnosis ED Disposition     ED Disposition  Admit   Condition  --   Comment  Hospital Area: [facility] [[id number]]  Level of Care: Telemetry Surgical [[id number]]  May admit patient to [facility] or [facility] if equivalent level of care is available:: No  Covid Evaluation: Asymptomatic - no recent exposure (last 10 days) testing not required  Diagnosis: Weakness [241835]  Admitting Physician: Clydie Braun [[id number]]  Attending Physician: Clydie Braun [[id number]]  Certification:: I certify this patient will need inpatient services for at least 2 midnights  Expected Medical Readiness: 07/29/2023          B Medical/Surgery History Past Medical History:  Diagnosis Date   Anxiety    Arthritis    Cervical spine fracture (HCC)    13 -diving accident, no neurolgic sequelae   Depression    Paroxysmal atrial fibrillation (HCC)    Stroke (HCC)    Syncope 11/27/2012   From Tramadol   Past Surgical History:  Procedure Laterality Date   BUBBLE  STUDY  03/19/2021   Procedure: BUBBLE STUDY;  Surgeon: Wendall Stade, MD;  Location: [facility] ENDOSCOPY;  Service: Cardiovascular;;   CERVICAL DISCECTOMY  2004   dr Channing Mutters   KNEE SURGERY  1981   right   LOOP RECORDER INSERTION N/A 03/19/2021   Procedure: LOOP RECORDER INSERTION;  Surgeon: Lanier Prude, MD;  Location: [facility] INVASIVE CV LAB;  Service: Cardiovascular;  Laterality: N/A;   NECK SURGERY     REPLACEMENT TOTAL KNEE     right   REPLACEMENT TOTAL KNEE     TEE WITHOUT CARDIOVERSION N/A 03/19/2021   Procedure: TRANSESOPHAGEAL ECHOCARDIOGRAM (TEE);  Surgeon: Wendall Stade, MD;  Location: [facility] ENDOSCOPY;  Service: Cardiovascular;  Laterality: N/A;     A IV Location/Drains/Wounds Patient Lines/Drains/Airways Status     Active Line/Drains/Airways     Name Placement date Placement time Site Days   Peripheral IV 07/25/23 20 G Posterior;Right Hand 07/25/23  0321  Hand  less than 1            Intake/Output Last 24 hours No intake or output data in the 24 hours ending 07/25/23 1245  Labs/Imaging Results for orders placed or performed during the hospital encounter of 07/24/23 (from the past 48 hour(s))  CBG monitoring, ED     Status: Abnormal   Collection Time: 07/24/23  8:21 PM  Result Value Ref Range  Glucose-Capillary 163 (H) 70 - 99 mg/dL    Comment: Glucose reference range applies only to samples taken after fasting for at least 8 hours.  Urinalysis, Routine w reflex microscopic -Urine, Clean Catch     Status: Abnormal   Collection Time: 07/24/23  8:23 PM  Result Value Ref Range   Color, Urine YELLOW YELLOW   APPearance CLEAR CLEAR   Specific Gravity, Urine 1.017 1.005 - 1.030   pH 5.0 5.0 - 8.0   Glucose, UA NEGATIVE NEGATIVE mg/dL   Hgb urine dipstick NEGATIVE NEGATIVE   Bilirubin Urine NEGATIVE NEGATIVE   Ketones, ur NEGATIVE NEGATIVE mg/dL   Protein, ur NEGATIVE NEGATIVE mg/dL   Nitrite NEGATIVE NEGATIVE   Leukocytes,Ua TRACE (A) NEGATIVE   RBC / HPF 0-5 0 - 5  RBC/hpf   WBC, UA 0-5 0 - 5 WBC/hpf   Bacteria, UA NONE SEEN NONE SEEN   Squamous Epithelial / HPF 0-5 0 - 5 /HPF   Mucus PRESENT     Comment: Performed at [facility], [address], [place], [place] [address]  Basic metabolic panel     Status: Abnormal   Collection Time: 07/24/23  8:41 PM  Result Value Ref Range   Sodium 137 135 - 145 mmol/L   Potassium 3.8 3.5 - 5.1 mmol/L   Chloride 102 98 - 111 mmol/L   CO2 24 22 - 32 mmol/L   Glucose, Bld 146 (H) 70 - 99 mg/dL    Comment: Glucose reference range applies only to samples taken after fasting for at least 8 hours.   BUN 18 8 - 23 mg/dL   Creatinine, Ser 1.91 0.61 - 1.24 mg/dL   Calcium 9.6 8.9 - 47.8 mg/dL   GFR, Estimated >29 >56 mL/min    Comment: (NOTE) Calculated using the CKD-EPI Creatinine Equation (2021)    Anion gap 11 5 - 15    Comment: Performed at [facility], [address] [place], [place] [address]  CBC     Status: Abnormal   Collection Time: 07/24/23  8:41 PM  Result Value Ref Range   WBC 11.5 (H) 4.0 - 10.5 K/uL   RBC 4.94 4.22 - 5.81 MIL/uL   Hemoglobin 14.7 13.0 - 17.0 g/dL   HCT 65.7 84.6 - 96.2 %   MCV 89.7 80.0 - 100.0 fL   MCH 29.8 26.0 - 34.0 pg   MCHC 33.2 30.0 - 36.0 g/dL   RDW 95.2 84.1 - 32.4 %   Platelets 311 150 - 400 K/uL   nRBC 0.0 0.0 - 0.2 %    Comment: Performed at [facility], [address] [place], [place] [address]  Troponin I (High Sensitivity)     Status: None   Collection Time: 07/24/23  8:41 PM  Result Value Ref Range   Troponin I (High Sensitivity) 7 <18 ng/L    Comment: (NOTE) Elevated high sensitivity troponin I (hsTnI) values and significant  changes across serial measurements may suggest ACS but many other  chronic and acute conditions are known to elevate hsTnI results.  Refer to the "Links" section for chest pain algorithms and additional  guidance. Performed at [facility], [address], [place], [place] [address]   I-Stat CG4  Lactic Acid, ED     Status: None   Collection Time: 07/24/23  8:54 PM  Result Value Ref Range   Lactic Acid, Venous 1.4 0.5 - 1.9 mmol/L  Troponin I (High Sensitivity)  Status: None   Collection Time: 07/24/23 10:53 PM  Result Value Ref Range   Troponin I (High Sensitivity) 7 <18 ng/L    Comment: (NOTE) Elevated high sensitivity troponin I (hsTnI) values and significant  changes across serial measurements may suggest ACS but many other  chronic and acute conditions are known to elevate hsTnI results.  Refer to the "Links" section for chest pain algorithms and additional  guidance. Performed at [facility], [address], [place], [place] [address]   Magnesium     Status: None   Collection Time: 07/24/23 10:53 PM  Result Value Ref Range   Magnesium 2.2 1.7 - 2.4 mg/dL    Comment: Performed at [facility], [address], [place], [place] [address]   MR Lumbar Spine W Wo Contrast  Result Date: 07/25/2023 CLINICAL DATA:  One week progressive worsening tingling and numbness in the bilateral lower extremities. EXAM: MRI LUMBAR SPINE WITHOUT AND WITH CONTRAST TECHNIQUE: Multiplanar and multiecho pulse sequences of the lumbar spine were obtained without and with intravenous contrast. CONTRAST:  10mL GADAVIST GADOBUTROL 1 MMOL/ML IV SOLN COMPARISON:  02/18/2023 FINDINGS: Segmentation:  Standard. Alignment:  Physiologic. Vertebrae:  No fracture, evidence of discitis, or bone lesion. Conus medullaris and cauda equina: Conus extends to the L2 level. Edematous signal in the cord at T11-12. There may be a new level of cord edema at T10-11, limited by partial coverage. Edema at this upper level would be new from prior. There is cauda equina redundancy from the degree of severe spinal stenosis. Paraspinal and other soft tissues: No perispinal mass or inflammation Disc levels: T10-11: Spondylosis and asymmetric left facet spurring with moderate left foraminal narrowing. The spinal canal  appears patent on sagittal images. T11-12: Disc narrowing with central protrusion. Facet spurring ligamentum flavum thickening causes cord compression and T2 hyperintensity. Moderate left foraminal narrowing. T12- L1: Mild facet spurring L1-L2: Moderate facet spurring. Tiny bilateral paracentral protrusion L2-L3: Disc narrowing and bulging with endplate ridging. Degenerative facet spurring on both sides. Advanced thecal sac narrowing. L3-L4: Degenerative facet spurring and circumferential disc bulging with short pedicles causes compressive thecal sac stenosis. L4-L5: Central protrusion and circumferential disc bulging. Bulky degenerative facet spurring and ligamentum flavum thickening. Advanced spinal stenosis. Moderate left foraminal impingement L5-S1:Bulging disc and facet spurring asymmetric to the left where there is foraminal impingement. Mild to moderate right foraminal narrowing mainly from facet spurring. Patent spinal canal. IMPRESSION: 1. Chronic, degenerative cord compression at T11-12. Cord edema is seen at this level. There may also be cord edema at the T10-11 level, which would be new from June 2024. Suggest thoracic imaging given there is history of proximal deficits. 2. Degeneration and short pedicles causes compressive spinal stenosis at L2-3 to L4-5. 3. Foraminal impingement on the left at T10-11, T11-12, L4-5, and L5-S1. Electronically Signed   By: Tiburcio Pea M.D.   On: 07/25/2023 07:01   DG Chest Portable 1 View  Result Date: 07/25/2023 CLINICAL DATA:  Chest pain EXAM: PORTABLE CHEST 1 VIEW COMPARISON:  02/06/2023 FINDINGS: The heart size and mediastinal contours are within normal limits. Both lungs are clear. The visualized skeletal structures are unremarkable. Loop recorder is noted. IMPRESSION: No acute abnormality noted. Electronically Signed   By: Alcide Clever M.D.   On: 07/25/2023 03:26   CT HEAD WO CONTRAST  Result Date: 07/24/2023 CLINICAL DATA:  Chest pain and syncopal  episode, initial encounter EXAM: CT HEAD WITHOUT CONTRAST TECHNIQUE: Contiguous axial images were obtained from  the base of the skull through the vertex without intravenous contrast. RADIATION DOSE REDUCTION: This exam was performed according to the departmental dose-optimization program which includes automated exposure control, adjustment of the mA and/or kV according to patient size and/or use of iterative reconstruction technique. COMPARISON:  03/17/2021 FINDINGS: Brain: No evidence of acute infarction, hemorrhage, hydrocephalus, extra-axial collection or mass lesion/mass effect. Vascular: No hyperdense vessel or unexpected calcification. Skull: Normal. Negative for fracture or focal lesion. Sinuses/Orbits: No acute finding. Other: None. IMPRESSION: No acute intracranial abnormality noted. Electronically Signed   By: Alcide Clever M.D.   On: 07/24/2023 21:55    Pending Labs Unresulted Labs (From admission, onward)     Start     Ordered   07/26/23 0500  CBC  Tomorrow morning,   R        07/25/23 1101   07/26/23 0500  Comprehensive metabolic panel  Tomorrow morning,   R        07/25/23 1101   07/25/23 1102  HIV Antibody (routine testing w rflx)  (HIV Antibody (Routine testing w reflex) panel)  Once,   R        07/25/23 1101            Vitals/Pain Today's Vitals   07/25/23 0959 07/25/23 1015 07/25/23 1100 07/25/23 1150  BP:  (!) 133/93 100/63 (!) 146/94  Pulse:  70 65 71  Resp:  (!) 21 14   Temp: 98.1 F (36.7 C)     TempSrc: Oral     SpO2:  97% 93%   PainSc:        Isolation Precautions No active isolations  Medications Medications  dexamethasone (DECADRON) injection 4 mg (4 mg Intravenous Given 07/25/23 0959)  buPROPion (WELLBUTRIN XL) 24 hr tablet 150 mg (150 mg Oral Given 07/25/23 1149)  carvedilol (COREG) tablet 3.125 mg (3.125 mg Oral Given 07/25/23 1150)  sodium chloride flush (NS) 0.9 % injection 3 mL (3 mLs Intravenous Not Given 07/25/23 1150)  acetaminophen  (TYLENOL) tablet 650 mg (has no administration in time range)    Or  acetaminophen (TYLENOL) suppository 650 mg (has no administration in time range)  albuterol (PROVENTIL) (2.5 MG/3ML) 0.083% nebulizer solution 2.5 mg (has no administration in time range)  ondansetron (ZOFRAN) tablet 4 mg (has no administration in time range)    Or  ondansetron (ZOFRAN) injection 4 mg (has no administration in time range)  gadobutrol (GADAVIST) 1 MMOL/ML injection 10 mL (10 mLs Intravenous Contrast Given 07/25/23 0640)    Mobility walks with person assist     Focused Assessments Cardiac Assessment Handoff:    Lab Results  Component Value Date   CKTOTAL 91 03/17/2021   TROPONINI <0.30 11/28/2012   No results found for: "DDIMER" Does the Patient currently have chest pain? No   , Neuro Assessment Handoff:  Swallow screen pass? Yes          Neuro Assessment:   Neuro Checks:      Has TPA been given? No If patient is a Neuro Trauma and patient is going to OR before floor call report to 4N Charge nurse: [phone] or [phone]   R Recommendations: See Admitting Provider Note  Report given to:   Additional Notes:

## 2023-07-26 DIAGNOSIS — M48061 Spinal stenosis, lumbar region without neurogenic claudication: Secondary | ICD-10-CM

## 2023-07-26 LAB — APTT
aPTT: 34 s (ref 24–36)
aPTT: 42 s — ABNORMAL HIGH (ref 24–36)

## 2023-07-26 LAB — CBC
HCT: 42.9 % (ref 39.0–52.0)
Hemoglobin: 14.4 g/dL (ref 13.0–17.0)
MCH: 29.8 pg (ref 26.0–34.0)
MCHC: 33.6 g/dL (ref 30.0–36.0)
MCV: 88.6 fL (ref 80.0–100.0)
Platelets: 294 10*3/uL (ref 150–400)
RBC: 4.84 MIL/uL (ref 4.22–5.81)
RDW: 13 % (ref 11.5–15.5)
WBC: 7.5 10*3/uL (ref 4.0–10.5)
nRBC: 0 % (ref 0.0–0.2)

## 2023-07-26 LAB — COMPREHENSIVE METABOLIC PANEL
ALT: 22 U/L (ref 0–44)
AST: 17 U/L (ref 15–41)
Albumin: 3.5 g/dL (ref 3.5–5.0)
Alkaline Phosphatase: 70 U/L (ref 38–126)
Anion gap: 8 (ref 5–15)
BUN: 19 mg/dL (ref 8–23)
CO2: 23 mmol/L (ref 22–32)
Calcium: 9 mg/dL (ref 8.9–10.3)
Chloride: 105 mmol/L (ref 98–111)
Creatinine, Ser: 1.21 mg/dL (ref 0.61–1.24)
GFR, Estimated: >60 mL/min (ref >60)
Glucose, Bld: 159 mg/dL — ABNORMAL HIGH (ref 70–99)
Potassium: 4.2 mmol/L (ref 3.5–5.1)
Sodium: 136 mmol/L (ref 135–145)
Total Bilirubin: 0.7 mg/dL (ref <1.2)
Total Protein: 6.3 g/dL — ABNORMAL LOW (ref 6.5–8.1)

## 2023-07-26 LAB — HEPARIN LEVEL (UNFRACTIONATED)
Heparin Unfractionated: 0.4 [IU]/mL (ref 0.30–0.70)
Heparin Unfractionated: 0.45 [IU]/mL (ref 0.30–0.70)

## 2023-07-26 MED ORDER — CHLORHEXIDINE GLUCONATE CLOTH 2 % EX PADS
6.0000 | MEDICATED_PAD | Freq: Once | CUTANEOUS | Status: AC
  Administered 2023-07-26: 6 via TOPICAL

## 2023-07-26 MED ORDER — CEFAZOLIN SODIUM-DEXTROSE 2-4 GM/100ML-% IV SOLN
2.0000 g | INTRAVENOUS | Status: AC
  Administered 2023-07-27: 2 g via INTRAVENOUS
  Filled 2023-07-26: qty 100

## 2023-07-26 MED ORDER — MELATONIN 5 MG PO TABS
5.0000 mg | ORAL_TABLET | Freq: Once | ORAL | Status: AC
  Administered 2023-07-27: 5 mg via ORAL
  Filled 2023-07-26: qty 1

## 2023-07-26 MED ORDER — ZOLPIDEM TARTRATE 5 MG PO TABS
5.0000 mg | ORAL_TABLET | Freq: Every evening | ORAL | Status: DC | PRN
  Administered 2023-07-26 – 2023-07-27 (×2): 5 mg via ORAL
  Filled 2023-07-26 (×2): qty 1

## 2023-07-26 MED ORDER — CHLORHEXIDINE GLUCONATE CLOTH 2 % EX PADS
6.0000 | MEDICATED_PAD | Freq: Once | CUTANEOUS | Status: AC
  Administered 2023-07-27: 6 via TOPICAL

## 2023-07-26 NOTE — Progress Notes (Signed)
PROGRESS NOTE   NOU DEBOER  ZOX:[id number]    DOB: 1955-10-24    DOA: 07/24/2023  PCP: Shirline Frees, NP   I have briefly reviewed patients previous medical records in [facility].  Chief Complaint  Patient presents with   Chest Pain   Weakness    Brief Hospital Course:  67 year old male with medical history significant for HTN, HLD, PAF on Eliquis, CVA, anxiety and depression, presented to the ED due to complaints of weakness in his legs, legs giving out under him, numbness and tingling in both of his legs, unsteadiness on his feet and the symptoms have progressively gotten worse over a week's time.  Neurosurgery was consulted and indicated that MRI L-spine shows severe stenosis at T11-T12 with cord compression, chronic severe spinal stenosis from L2-5 as well.  There are plans surgical decompression on 11/20.   Assessment & Plan:  Principal Problem:   Spinal stenosis of lumbar region Active Problems:   Cord compression myelopathy (HCC)   Paroxysmal atrial fibrillation (HCC)   Essential hypertension   Hyperlipidemia   Obesity (BMI 30-39.9)   Thoracolumbar spinal stenosis with cord compression at T11-12, L2-L5/ambulatory dysfunction/falls Neurosurgery consulted then plan surgical decompression on 11/20 after 3 days of Eliquis washout.  Last dose of Eliquis 11/17. MRI L-spine results as below.  CT head without acute findings. Continue IV Decadron 4 mg every 6 hourly. Postop PT and OT evaluation.  Currently PT recommending CIR.  Paroxysmal atrial fibrillation Telemetry shows SB in the 50s-SR CHA2DS2-VASc score: 4 Eliquis held and on IV heparin bridging for upcoming surgery.  IV heparin drip discontinued by neurosurgery on 11/19 afternoon. Continue carvedilol 3.125 Mg twice daily  Essential hypertension Controlled on carvedilol  Hyperlipidemia Continue atorvastatin  Insomnia Patient states that he was unable to sleep at all last night.  Indicates melatonin  does not work and he prefers Ambien.  Ordered.  Body mass index is 39.8 kg/m./Obesity   DVT prophylaxis: SCDs Start: 07/25/23 1102     Code Status: Full Code:  Family Communication: None at bedside Disposition:  Status is: Inpatient Remains inpatient appropriate because: Upcoming L-spine surgery.     Consultants:   Neurosurgery  Procedures:     Antimicrobials:      Subjective:  Patient reports absolute inability to sleep last night for no particular reason.  Wants a sleeping aid.  Declines melatonin which has not worked for him in the past.  Requests Ambien and really has tried Ambien CR in the past.  Objective:   Vitals:   07/25/23 1400 07/25/23 2008 07/26/23 0400 07/26/23 0824  BP: 125/76 134/74 (!) 97/58 130/76  Pulse: 74 74  71  Resp: 18 16  17   Temp: 97.7 F (36.5 C) 98.7 F (37.1 C) 98.5 F (36.9 C) 98.4 F (36.9 C)  TempSrc: Oral Oral Oral Oral  SpO2: 94% 95% 95% 98%  Weight: 108.5 kg     Height: 5\' 5"  (1.651 m)       General exam: Middle-age male, moderately built and obese lying comfortably supine in bed without distress. Respiratory system: Clear to auscultation. Respiratory effort normal. Cardiovascular system: S1 & S2 heard, RRR. No JVD, murmurs, rubs, gallops or clicks. No pedal edema.  Telemetry personally reviewed: SB in the high 50s-SR in the 60s without arrhythmia.  Discontinued telemetry. Gastrointestinal system: Abdomen is nondistended, soft and nontender. No organomegaly or masses felt. Normal bowel sounds heard. Central nervous system: Alert and oriented. No focal neurological deficits. Extremities: Symmetric 5  x 5 power. Skin: No rashes, lesions or ulcers Psychiatry: Judgement and insight appear normal. Mood & affect appropriate.     Data Reviewed:   I have personally reviewed following labs and imaging studies   CBC: Recent Labs  Lab 07/24/23 2041 07/26/23 0542  WBC 11.5* 7.5  HGB 14.7 14.4  HCT 44.3 42.9  MCV 89.7 88.6  PLT  311 294    Basic Metabolic Panel: Recent Labs  Lab 07/24/23 2041 07/24/23 2253 07/26/23 0542  NA 137  --  136  K 3.8  --  4.2  CL 102  --  105  CO2 24  --  23  GLUCOSE 146*  --  159*  BUN 18  --  19  CREATININE 1.08  --  1.21  CALCIUM 9.6  --  9.0  MG  --  2.2  --     Liver Function Tests: Recent Labs  Lab 07/26/23 0542  AST 17  ALT 22  ALKPHOS 70  BILITOT 0.7  PROT 6.3*  ALBUMIN 3.5    CBG: Recent Labs  Lab 07/24/23 2021  GLUCAP 163*    Microbiology Studies:  No results found for this or any previous visit (from the past 240 hour(s)).  Radiology Studies:  MR Lumbar Spine W Wo Contrast  Result Date: 07/25/2023 CLINICAL DATA:  One week progressive worsening tingling and numbness in the bilateral lower extremities. EXAM: MRI LUMBAR SPINE WITHOUT AND WITH CONTRAST TECHNIQUE: Multiplanar and multiecho pulse sequences of the lumbar spine were obtained without and with intravenous contrast. CONTRAST:  10mL GADAVIST GADOBUTROL 1 MMOL/ML IV SOLN COMPARISON:  02/18/2023 FINDINGS: Segmentation:  Standard. Alignment:  Physiologic. Vertebrae:  No fracture, evidence of discitis, or bone lesion. Conus medullaris and cauda equina: Conus extends to the L2 level. Edematous signal in the cord at T11-12. There may be a new level of cord edema at T10-11, limited by partial coverage. Edema at this upper level would be new from prior. There is cauda equina redundancy from the degree of severe spinal stenosis. Paraspinal and other soft tissues: No perispinal mass or inflammation Disc levels: T10-11: Spondylosis and asymmetric left facet spurring with moderate left foraminal narrowing. The spinal canal appears patent on sagittal images. T11-12: Disc narrowing with central protrusion. Facet spurring ligamentum flavum thickening causes cord compression and T2 hyperintensity. Moderate left foraminal narrowing. T12- L1: Mild facet spurring L1-L2: Moderate facet spurring. Tiny bilateral paracentral  protrusion L2-L3: Disc narrowing and bulging with endplate ridging. Degenerative facet spurring on both sides. Advanced thecal sac narrowing. L3-L4: Degenerative facet spurring and circumferential disc bulging with short pedicles causes compressive thecal sac stenosis. L4-L5: Central protrusion and circumferential disc bulging. Bulky degenerative facet spurring and ligamentum flavum thickening. Advanced spinal stenosis. Moderate left foraminal impingement L5-S1:Bulging disc and facet spurring asymmetric to the left where there is foraminal impingement. Mild to moderate right foraminal narrowing mainly from facet spurring. Patent spinal canal. IMPRESSION: 1. Chronic, degenerative cord compression at T11-12. Cord edema is seen at this level. There may also be cord edema at the T10-11 level, which would be new from June 2024. Suggest thoracic imaging given there is history of proximal deficits. 2. Degeneration and short pedicles causes compressive spinal stenosis at L2-3 to L4-5. 3. Foraminal impingement on the left at T10-11, T11-12, L4-5, and L5-S1. Electronically Signed   By: Tiburcio Pea M.D.   On: 07/25/2023 07:01   DG Chest Portable 1 View  Result Date: 07/25/2023 CLINICAL DATA:  Chest pain EXAM: PORTABLE CHEST  1 VIEW COMPARISON:  02/06/2023 FINDINGS: The heart size and mediastinal contours are within normal limits. Both lungs are clear. The visualized skeletal structures are unremarkable. Loop recorder is noted. IMPRESSION: No acute abnormality noted. Electronically Signed   By: Alcide Clever M.D.   On: 07/25/2023 03:26   CT HEAD WO CONTRAST  Result Date: 07/24/2023 CLINICAL DATA:  Chest pain and syncopal episode, initial encounter EXAM: CT HEAD WITHOUT CONTRAST TECHNIQUE: Contiguous axial images were obtained from the base of the skull through the vertex without intravenous contrast. RADIATION DOSE REDUCTION: This exam was performed according to the departmental dose-optimization program which  includes automated exposure control, adjustment of the mA and/or kV according to patient size and/or use of iterative reconstruction technique. COMPARISON:  03/17/2021 FINDINGS: Brain: No evidence of acute infarction, hemorrhage, hydrocephalus, extra-axial collection or mass lesion/mass effect. Vascular: No hyperdense vessel or unexpected calcification. Skull: Normal. Negative for fracture or focal lesion. Sinuses/Orbits: No acute finding. Other: None. IMPRESSION: No acute intracranial abnormality noted. Electronically Signed   By: Alcide Clever M.D.   On: 07/24/2023 21:55    Scheduled Meds:    buPROPion  150 mg Oral Daily   carvedilol  3.125 mg Oral BID WC   dexamethasone (DECADRON) injection  4 mg Intravenous Q6H   sodium chloride flush  3 mL Intravenous Q12H    Continuous Infusions:     LOS: 1 day     Marcellus Scott, MD,  FACP, [facility], [facility], [facility]   [facility]      To contact the attending provider between 7A-7P or the covering provider during after hours 7P-7A, please log into the web site [email or web address] and access using universal [place] password for that web site. If you do not have the password, please call the hospital operator.  07/26/2023, 5:31 PM

## 2023-07-26 NOTE — Progress Notes (Signed)
Subjective: Patient reports doing ok, numbness has gotten better on steroids  Objective: Vital signs in last 24 hours: Temp:  [97.7 F (36.5 C)-98.7 F (37.1 C)] 98.5 F (36.9 C) (11/19 0400) Pulse Rate:  [65-79] 74 (11/18 2008) Resp:  [14-21] 16 (11/18 2008) BP: (97-155)/(58-94) 97/58 (11/19 0400) SpO2:  [93 %-98 %] 95 % (11/19 0400) Weight:  [108.5 kg] 108.5 kg (11/18 1400)  Intake/Output from previous day: 11/18 0701 - 11/19 0700 In: 118.4 [I.V.:118.4] Out: 400 [Urine:400] Intake/Output this shift: No intake/output data recorded.  Neurologic: Grossly normal  Lab Results: Lab Results  Component Value Date   WBC 7.5 07/26/2023   HGB 14.4 07/26/2023   HCT 42.9 07/26/2023   MCV 88.6 07/26/2023   PLT 294 07/26/2023   Lab Results  Component Value Date   INR 1.0 02/06/2023   BMET Lab Results  Component Value Date   NA 136 07/26/2023   K 4.2 07/26/2023   CL 105 07/26/2023   CO2 23 07/26/2023   GLUCOSE 159 (H) 07/26/2023   BUN 19 07/26/2023   CREATININE 1.21 07/26/2023   CALCIUM 9.0 07/26/2023    Studies/Results: MR Lumbar Spine W Wo Contrast  Result Date: 07/25/2023 CLINICAL DATA:  One week progressive worsening tingling and numbness in the bilateral lower extremities. EXAM: MRI LUMBAR SPINE WITHOUT AND WITH CONTRAST TECHNIQUE: Multiplanar and multiecho pulse sequences of the lumbar spine were obtained without and with intravenous contrast. CONTRAST:  10mL GADAVIST GADOBUTROL 1 MMOL/ML IV SOLN COMPARISON:  02/18/2023 FINDINGS: Segmentation:  Standard. Alignment:  Physiologic. Vertebrae:  No fracture, evidence of discitis, or bone lesion. Conus medullaris and cauda equina: Conus extends to the L2 level. Edematous signal in the cord at T11-12. There may be a new level of cord edema at T10-11, limited by partial coverage. Edema at this upper level would be new from prior. There is cauda equina redundancy from the degree of severe spinal stenosis. Paraspinal and other  soft tissues: No perispinal mass or inflammation Disc levels: T10-11: Spondylosis and asymmetric left facet spurring with moderate left foraminal narrowing. The spinal canal appears patent on sagittal images. T11-12: Disc narrowing with central protrusion. Facet spurring ligamentum flavum thickening causes cord compression and T2 hyperintensity. Moderate left foraminal narrowing. T12- L1: Mild facet spurring L1-L2: Moderate facet spurring. Tiny bilateral paracentral protrusion L2-L3: Disc narrowing and bulging with endplate ridging. Degenerative facet spurring on both sides. Advanced thecal sac narrowing. L3-L4: Degenerative facet spurring and circumferential disc bulging with short pedicles causes compressive thecal sac stenosis. L4-L5: Central protrusion and circumferential disc bulging. Bulky degenerative facet spurring and ligamentum flavum thickening. Advanced spinal stenosis. Moderate left foraminal impingement L5-S1:Bulging disc and facet spurring asymmetric to the left where there is foraminal impingement. Mild to moderate right foraminal narrowing mainly from facet spurring. Patent spinal canal. IMPRESSION: 1. Chronic, degenerative cord compression at T11-12. Cord edema is seen at this level. There may also be cord edema at the T10-11 level, which would be new from June 2024. Suggest thoracic imaging given there is history of proximal deficits. 2. Degeneration and short pedicles causes compressive spinal stenosis at L2-3 to L4-5. 3. Foraminal impingement on the left at T10-11, T11-12, L4-5, and L5-S1. Electronically Signed   By: Tiburcio Pea M.D.   On: 07/25/2023 07:01   DG Chest Portable 1 View  Result Date: 07/25/2023 CLINICAL DATA:  Chest pain EXAM: PORTABLE CHEST 1 VIEW COMPARISON:  02/06/2023 FINDINGS: The heart size and mediastinal contours are within normal limits. Both lungs are  clear. The visualized skeletal structures are unremarkable. Loop recorder is noted. IMPRESSION: No acute  abnormality noted. Electronically Signed   By: Alcide Clever M.D.   On: 07/25/2023 03:26   CT HEAD WO CONTRAST  Result Date: 07/24/2023 CLINICAL DATA:  Chest pain and syncopal episode, initial encounter EXAM: CT HEAD WITHOUT CONTRAST TECHNIQUE: Contiguous axial images were obtained from the base of the skull through the vertex without intravenous contrast. RADIATION DOSE REDUCTION: This exam was performed according to the departmental dose-optimization program which includes automated exposure control, adjustment of the mA and/or kV according to patient size and/or use of iterative reconstruction technique. COMPARISON:  03/17/2021 FINDINGS: Brain: No evidence of acute infarction, hemorrhage, hydrocephalus, extra-axial collection or mass lesion/mass effect. Vascular: No hyperdense vessel or unexpected calcification. Skull: Normal. Negative for fracture or focal lesion. Sinuses/Orbits: No acute finding. Other: None. IMPRESSION: No acute intracranial abnormality noted. Electronically Signed   By: Alcide Clever M.D.   On: 07/24/2023 21:55    Assessment/Plan: Will plan for decompression surgery tomorrow. NPO after midnight tonight.    LOS: 1 day    Cristian Gonzales Cristian Gonzales 07/26/2023, 7:57 AM
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
## 2023-07-26 NOTE — Progress Notes (Signed)
OT Cancellation Note  Patient Details Name: Cristian Gonzales MRN: [id number] DOB: 1955/10/30   Cancelled Treatment:    Reason Eval/Treat Not Completed: Other (comment). Pt planning for decompression surgery. Will await OT evaluation till after surgery to fully assess functional status and initiate education. Spoke with patient and he reports he as been independently mobility in room and is agreeable to OT evaluation after surgery.   Kenzie Flakes M Waylen Depaolo Jerusalen Mateja MSOT, OTR/L Acute Rehab Office: [phone] 07/26/2023, 4:11 PM

## 2023-07-26 NOTE — Progress Notes (Signed)
Inpatient Rehab Admissions Coordinator:   Per therapy recommendations,  patient was screened for CIR candidacy by Megan Salon, MS, CCC-SLP  At this time, Pt. is not medically ready for CIR.  Surgical decompression planned for 07/27/23. I will not pursue a rehab consult for this Pt. at this time, but CIR admissions team will follow and monitor for medical readiness post op and place consult order if Pt. appears to be an appropriate candidate. Please contact me with any questions.   Megan Salon, MS, CCC-SLP Rehab Admissions Coordinator  [phone] (celll) [phone] (office)

## 2023-07-26 NOTE — Plan of Care (Signed)

## 2023-07-27 ENCOUNTER — Inpatient Hospital Stay (HOSPITAL_COMMUNITY): Payer: Medicare PPO | Admitting: Anesthesiology

## 2023-07-27 ENCOUNTER — Inpatient Hospital Stay (HOSPITAL_COMMUNITY): Payer: Medicare PPO

## 2023-07-27 ENCOUNTER — Other Ambulatory Visit: Payer: Self-pay

## 2023-07-27 ENCOUNTER — Encounter (HOSPITAL_COMMUNITY)
Admission: EM | Disposition: A | Discharge status: Home Health | Payer: Self-pay | Source: Home / Self Care | Attending: Internal Medicine

## 2023-07-27 ENCOUNTER — Encounter (HOSPITAL_COMMUNITY): Payer: Self-pay | Admitting: Internal Medicine

## 2023-07-27 DIAGNOSIS — G959 Disease of spinal cord, unspecified: Secondary | ICD-10-CM | POA: Diagnosis present

## 2023-07-27 DIAGNOSIS — M4804 Spinal stenosis, thoracic region: Secondary | ICD-10-CM

## 2023-07-27 DIAGNOSIS — M48062 Spinal stenosis, lumbar region with neurogenic claudication: Secondary | ICD-10-CM

## 2023-07-27 HISTORY — PX: LUMBAR LAMINECTOMY/DECOMPRESSION MICRODISCECTOMY: SHX5026

## 2023-07-27 LAB — SURGICAL PCR SCREEN
MRSA, PCR: NEGATIVE
Staphylococcus aureus: NEGATIVE

## 2023-07-27 SURGERY — LUMBAR LAMINECTOMY/DECOMPRESSION MICRODISCECTOMY 2 LEVELS
Anesthesia: General | Site: Back

## 2023-07-27 MED ORDER — CHLORHEXIDINE GLUCONATE 0.12 % MT SOLN
OROMUCOSAL | Status: AC
  Administered 2023-07-27: 15 mL via OROMUCOSAL
  Filled 2023-07-27: qty 15

## 2023-07-27 MED ORDER — DEXAMETHASONE SODIUM PHOSPHATE 10 MG/ML IJ SOLN
INTRAMUSCULAR | Status: AC
  Filled 2023-07-27: qty 1

## 2023-07-27 MED ORDER — SODIUM CHLORIDE 0.9% FLUSH
3.0000 mL | Freq: Two times a day (BID) | INTRAVENOUS | Status: DC
Start: 2023-07-27 — End: 2023-07-28
  Administered 2023-07-27: 3 mL via INTRAVENOUS

## 2023-07-27 MED ORDER — THROMBIN 5000 UNITS EX SOLR
CUTANEOUS | Status: AC
  Filled 2023-07-27: qty 5000

## 2023-07-27 MED ORDER — SODIUM CHLORIDE 0.9% FLUSH: 3.0000 mL | INTRAVENOUS | Status: DC | PRN

## 2023-07-27 MED ORDER — OXYCODONE HCL 5 MG PO TABS
5.0000 mg | ORAL_TABLET | ORAL | Status: DC | PRN
  Administered 2023-07-27: 5 mg via ORAL
  Administered 2023-07-28 (×3): 10 mg via ORAL
  Filled 2023-07-27 (×4): qty 2

## 2023-07-27 MED ORDER — HYDROMORPHONE HCL 1 MG/ML IJ SOLN
INTRAMUSCULAR | Status: AC
  Filled 2023-07-27: qty 1

## 2023-07-27 MED ORDER — ATORVASTATIN CALCIUM 80 MG PO TABS
80.0000 mg | ORAL_TABLET | Freq: Every day | ORAL | Status: DC
  Administered 2023-07-28: 80 mg via ORAL
  Filled 2023-07-27: qty 1

## 2023-07-27 MED ORDER — THROMBIN 5000 UNITS EX SOLR
OROMUCOSAL | Status: DC | PRN
  Administered 2023-07-27: 5 mL via TOPICAL

## 2023-07-27 MED ORDER — SODIUM CHLORIDE 0.9 % IV SOLN
250.0000 mL | INTRAVENOUS | Status: DC
Start: 2023-07-27 — End: 2023-07-28
  Administered 2023-07-27: 250 mL via INTRAVENOUS

## 2023-07-27 MED ORDER — EPHEDRINE 5 MG/ML INJ
INTRAVENOUS | Status: AC
  Filled 2023-07-27: qty 5

## 2023-07-27 MED ORDER — HYDROMORPHONE HCL 1 MG/ML IJ SOLN
0.2500 mg | INTRAMUSCULAR | Status: DC | PRN
  Administered 2023-07-27 (×4): 0.5 mg via INTRAVENOUS

## 2023-07-27 MED ORDER — ORAL CARE MOUTH RINSE: 15.0000 mL | Freq: Once | OROMUCOSAL | Status: AC

## 2023-07-27 MED ORDER — CHLORHEXIDINE GLUCONATE 0.12 % MT SOLN: 15.0000 mL | Freq: Once | OROMUCOSAL | Status: AC

## 2023-07-27 MED ORDER — ACETAMINOPHEN 10 MG/ML IV SOLN
1000.0000 mg | Freq: Four times a day (QID) | INTRAVENOUS | Status: DC
  Administered 2023-07-27: 1000 mg via INTRAVENOUS

## 2023-07-27 MED ORDER — 0.9 % SODIUM CHLORIDE (POUR BTL) OPTIME
TOPICAL | Status: DC | PRN
  Administered 2023-07-27: 1000 mL

## 2023-07-27 MED ORDER — OXYCODONE HCL 5 MG PO TABS
5.0000 mg | ORAL_TABLET | Freq: Once | ORAL | Status: AC | PRN
  Administered 2023-07-27: 5 mg via ORAL

## 2023-07-27 MED ORDER — MIDAZOLAM HCL 2 MG/2ML IJ SOLN
INTRAMUSCULAR | Status: AC
  Filled 2023-07-27: qty 2

## 2023-07-27 MED ORDER — PHENOL 1.4 % MT LIQD: 1.0000 | OROMUCOSAL | Status: DC | PRN

## 2023-07-27 MED ORDER — OXYCODONE HCL 5 MG/5ML PO SOLN: 5.0000 mg | Freq: Once | ORAL | Status: AC | PRN

## 2023-07-27 MED ORDER — CEFAZOLIN SODIUM-DEXTROSE 2-4 GM/100ML-% IV SOLN
2.0000 g | Freq: Three times a day (TID) | INTRAVENOUS | Status: AC
  Administered 2023-07-27 – 2023-07-28 (×2): 2 g via INTRAVENOUS
  Filled 2023-07-27 (×2): qty 100

## 2023-07-27 MED ORDER — SUGAMMADEX SODIUM 200 MG/2ML IV SOLN
INTRAVENOUS | Status: DC | PRN
  Administered 2023-07-27 (×2): 200 mg via INTRAVENOUS

## 2023-07-27 MED ORDER — MENTHOL 3 MG MT LOZG: 1.0000 | LOZENGE | OROMUCOSAL | Status: DC | PRN

## 2023-07-27 MED ORDER — ONDANSETRON HCL 4 MG/2ML IJ SOLN
INTRAMUSCULAR | Status: DC | PRN
  Administered 2023-07-27: 4 mg via INTRAVENOUS

## 2023-07-27 MED ORDER — THROMBIN 20000 UNITS EX KIT
PACK | CUTANEOUS | Status: DC | PRN
  Administered 2023-07-27: 20 mL via TOPICAL

## 2023-07-27 MED ORDER — DEXAMETHASONE SODIUM PHOSPHATE 10 MG/ML IJ SOLN
INTRAMUSCULAR | Status: DC | PRN
  Administered 2023-07-27: 10 mg via INTRAVENOUS

## 2023-07-27 MED ORDER — LACTATED RINGERS IV SOLN: INTRAVENOUS | Status: DC

## 2023-07-27 MED ORDER — PHENYLEPHRINE 80 MCG/ML (10ML) SYRINGE FOR IV PUSH (FOR BLOOD PRESSURE SUPPORT)
PREFILLED_SYRINGE | INTRAVENOUS | Status: DC | PRN
  Administered 2023-07-27: 80 ug via INTRAVENOUS

## 2023-07-27 MED ORDER — ALUM & MAG HYDROXIDE-SIMETH 200-200-20 MG/5ML PO SUSP
30.0000 mL | Freq: Four times a day (QID) | ORAL | Status: DC | PRN
Start: 2023-07-27 — End: 2023-07-28

## 2023-07-27 MED ORDER — LACTATED RINGERS IV SOLN: INTRAVENOUS | Status: DC | PRN

## 2023-07-27 MED ORDER — ROCURONIUM BROMIDE 10 MG/ML (PF) SYRINGE
PREFILLED_SYRINGE | INTRAVENOUS | Status: DC | PRN
  Administered 2023-07-27: 70 mg via INTRAVENOUS

## 2023-07-27 MED ORDER — CARVEDILOL 3.125 MG PO TABS
3.1250 mg | ORAL_TABLET | Freq: Two times a day (BID) | ORAL | Status: DC
  Administered 2023-07-28: 3.125 mg via ORAL

## 2023-07-27 MED ORDER — FENTANYL CITRATE (PF) 250 MCG/5ML IJ SOLN
INTRAMUSCULAR | Status: AC
  Filled 2023-07-27: qty 5

## 2023-07-27 MED ORDER — ACETAMINOPHEN 10 MG/ML IV SOLN
INTRAVENOUS | Status: AC
  Filled 2023-07-27: qty 100

## 2023-07-27 MED ORDER — BUPIVACAINE HCL (PF) 0.25 % IJ SOLN
INTRAMUSCULAR | Status: AC
  Filled 2023-07-27: qty 30

## 2023-07-27 MED ORDER — FENTANYL CITRATE (PF) 250 MCG/5ML IJ SOLN
INTRAMUSCULAR | Status: DC | PRN
  Administered 2023-07-27: 25 ug via INTRAVENOUS
  Administered 2023-07-27: 100 ug via INTRAVENOUS
  Administered 2023-07-27: 50 ug via INTRAVENOUS
  Administered 2023-07-27: 75 ug via INTRAVENOUS

## 2023-07-27 MED ORDER — PANTOPRAZOLE SODIUM 40 MG IV SOLR
40.0000 mg | Freq: Every day | INTRAVENOUS | Status: DC
  Administered 2023-07-27: 40 mg via INTRAVENOUS
  Filled 2023-07-27: qty 10

## 2023-07-27 MED ORDER — THROMBIN 20000 UNITS EX SOLR
CUTANEOUS | Status: AC
  Filled 2023-07-27: qty 20000

## 2023-07-27 MED ORDER — BUPIVACAINE HCL (PF) 0.25 % IJ SOLN
INTRAMUSCULAR | Status: DC | PRN
  Administered 2023-07-27: 10 mL

## 2023-07-27 MED ORDER — PHENYLEPHRINE HCL-NACL 20-0.9 MG/250ML-% IV SOLN
INTRAVENOUS | Status: DC | PRN
  Administered 2023-07-27: 15 ug/min via INTRAVENOUS

## 2023-07-27 MED ORDER — FENTANYL CITRATE (PF) 100 MCG/2ML IJ SOLN
25.0000 ug | INTRAMUSCULAR | Status: DC | PRN
  Administered 2023-07-27 (×2): 50 ug via INTRAVENOUS

## 2023-07-27 MED ORDER — HYDROMORPHONE HCL 1 MG/ML IJ SOLN
0.5000 mg | INTRAMUSCULAR | Status: DC | PRN
  Administered 2023-07-27 – 2023-07-28 (×2): 0.5 mg via INTRAVENOUS
  Filled 2023-07-27 (×2): qty 0.5

## 2023-07-27 MED ORDER — PROPOFOL 10 MG/ML IV BOLUS
INTRAVENOUS | Status: DC | PRN
  Administered 2023-07-27: 200 mg via INTRAVENOUS

## 2023-07-27 MED ORDER — LIDOCAINE-EPINEPHRINE 1 %-1:100000 IJ SOLN
INTRAMUSCULAR | Status: AC
  Filled 2023-07-27: qty 1

## 2023-07-27 MED ORDER — OXYCODONE HCL 5 MG PO TABS
ORAL_TABLET | ORAL | Status: AC
  Filled 2023-07-27: qty 1

## 2023-07-27 MED ORDER — MIDAZOLAM HCL 2 MG/2ML IJ SOLN
INTRAMUSCULAR | Status: DC | PRN
  Administered 2023-07-27: 2 mg via INTRAVENOUS

## 2023-07-27 MED ORDER — FENTANYL CITRATE (PF) 100 MCG/2ML IJ SOLN
INTRAMUSCULAR | Status: AC
  Filled 2023-07-27: qty 2

## 2023-07-27 MED ORDER — LIDOCAINE 2% (20 MG/ML) 5 ML SYRINGE
INTRAMUSCULAR | Status: DC | PRN
  Administered 2023-07-27: 100 mg via INTRAVENOUS

## 2023-07-27 MED ORDER — ONDANSETRON HCL 4 MG/2ML IJ SOLN
INTRAMUSCULAR | Status: AC
  Filled 2023-07-27: qty 2

## 2023-07-27 MED ORDER — LIDOCAINE-EPINEPHRINE 1 %-1:100000 IJ SOLN
INTRAMUSCULAR | Status: DC | PRN
  Administered 2023-07-27: 10 mL

## 2023-07-27 SURGICAL SUPPLY — 44 items
BAG COUNTER SPONGE SURGICOUNT (BAG) ×2 IMPLANT
BENZOIN TINCTURE PRP APPL 2/3 (GAUZE/BANDAGES/DRESSINGS) ×2 IMPLANT
BLADE CLIPPER SURG (BLADE) IMPLANT
BLADE SURG 11 STRL SS (BLADE) ×2 IMPLANT
BUR CUTTER 7.0 ROUND (BURR) ×2 IMPLANT
BUR MATCHSTICK NEURO 3.0 LAGG (BURR) ×1 IMPLANT
CANISTER SUCT 3000ML PPV (MISCELLANEOUS) ×1 IMPLANT
DERMABOND ADVANCED .7 DNX12 (GAUZE/BANDAGES/DRESSINGS) ×2 IMPLANT
DRAPE HALF SHEET 40X57 (DRAPES) IMPLANT
DRAPE LAPAROTOMY 100X72X124 (DRAPES) ×2 IMPLANT
DRAPE MICROSCOPE SLANT 54X150 (MISCELLANEOUS) ×2 IMPLANT
DRAPE SURG 17X23 STRL (DRAPES) ×1 IMPLANT
DRSG OPSITE POSTOP 4X6 (GAUZE/BANDAGES/DRESSINGS) IMPLANT
DURAPREP 26ML APPLICATOR (WOUND CARE) ×2 IMPLANT
ELECT REM PT RETURN 9FT ADLT (ELECTROSURGICAL) ×1
ELECTRODE REM PT RTRN 9FT ADLT (ELECTROSURGICAL) ×2 IMPLANT
GAUZE 4X4 16PLY ~~LOC~~+RFID DBL (SPONGE) IMPLANT
GAUZE SPONGE 4X4 12PLY STRL (GAUZE/BANDAGES/DRESSINGS) ×1 IMPLANT
GLOVE BIO SURGEON STRL SZ7 (GLOVE) IMPLANT
GLOVE BIO SURGEON STRL SZ8 (GLOVE) ×2 IMPLANT
GLOVE BIOGEL PI IND STRL 7.0 (GLOVE) IMPLANT
GLOVE EXAM NITRILE XL STR (GLOVE) IMPLANT
GLOVE INDICATOR 8.5 STRL (GLOVE) ×4 IMPLANT
GOWN STRL REUS W/ TWL LRG LVL3 (GOWN DISPOSABLE) ×1 IMPLANT
GOWN STRL REUS W/ TWL XL LVL3 (GOWN DISPOSABLE) ×2 IMPLANT
GOWN STRL REUS W/TWL 2XL LVL3 (GOWN DISPOSABLE) IMPLANT
HEMOSTAT POWDER KIT SURGIFOAM (HEMOSTASIS) IMPLANT
KIT BASIN OR (CUSTOM PROCEDURE TRAY) ×1 IMPLANT
KIT TURNOVER KIT B (KITS) ×1 IMPLANT
NDL HYPO 22X1.5 SAFETY MO (MISCELLANEOUS) ×1 IMPLANT
NDL SPNL 22GX3.5 QUINCKE BK (NEEDLE) ×1 IMPLANT
NEEDLE HYPO 22X1.5 SAFETY MO (MISCELLANEOUS) ×1 IMPLANT
NEEDLE SPNL 22GX3.5 QUINCKE BK (NEEDLE) ×1 IMPLANT
NS IRRIG 1000ML POUR BTL (IV SOLUTION) ×1 IMPLANT
PACK LAMINECTOMY NEURO (CUSTOM PROCEDURE TRAY) ×1 IMPLANT
SPIKE FLUID TRANSFER (MISCELLANEOUS) ×1 IMPLANT
SPONGE SURGIFOAM ABS GEL SZ50 (HEMOSTASIS) ×1 IMPLANT
STRIP CLOSURE SKIN 1/2X4 (GAUZE/BANDAGES/DRESSINGS) ×1 IMPLANT
SUT VIC AB 0 CT1 18XCR BRD8 (SUTURE) ×2 IMPLANT
SUT VIC AB 2-0 CT1 18 (SUTURE) ×2 IMPLANT
SUT VICRYL 4-0 PS2 18IN ABS (SUTURE) ×1 IMPLANT
TOWEL GREEN STERILE (TOWEL DISPOSABLE) ×1 IMPLANT
TOWEL GREEN STERILE FF (TOWEL DISPOSABLE) ×2 IMPLANT
WATER STERILE IRR 1000ML POUR (IV SOLUTION) ×1 IMPLANT

## 2023-07-27 NOTE — Telephone Encounter (Signed)
Not sure if you need to fill or Cardio. Looks like its been filled by both providers.

## 2023-07-27 NOTE — Progress Notes (Signed)
OT Cancellation Note  Patient Details Name: Cristian Gonzales MRN: [id number] DOB: 08-14-1956   Cancelled Treatment:    Reason Eval/Treat Not Completed: Other (comment)- plan for decompression surgery today, holding OT until after surgery.   Barry Brunner, OT Acute Rehabilitation Services Office [phone]   Chancy Milroy 07/27/2023, 1:19 PM

## 2023-07-27 NOTE — Progress Notes (Signed)
Hold coreg per Dr. Chaney Malling due to Heart rate between 56-62.

## 2023-07-27 NOTE — Anesthesia Procedure Notes (Signed)
Procedure Name: Intubation Date/Time: 07/27/2023 5:28 PM  Performed by: Darlina Guys, CRNAPre-anesthesia Checklist: Patient identified, Emergency Drugs available, Suction available, Patient being monitored and Timeout performed Patient Re-evaluated:Patient Re-evaluated prior to induction Oxygen Delivery Method: Circle system utilized Preoxygenation: Pre-oxygenation with 100% oxygen Induction Type: IV induction Ventilation: Oral airway inserted - appropriate to patient size Laryngoscope Size: Glidescope and 3 Grade View: Grade I Tube type: Subglottic suction tube Tube size: 7.5 mm Number of attempts: 1 Airway Equipment and Method: Stylet and Video-laryngoscopy Placement Confirmation: ETT inserted through vocal cords under direct vision, breath sounds checked- equal and bilateral and CO2 detector Secured at: 23 cm Tube secured with: Tape Dental Injury: Teeth and Oropharynx as per pre-operative assessment  Comments: Atraumatic intubation. Dentition as per preop

## 2023-07-27 NOTE — Progress Notes (Signed)
Progress Note   Patient: Cristian Gonzales WUJ:[id number] DOB: September 10, 1955 DOA: 07/24/2023     2 DOS: the patient was seen and examined on 07/27/2023   Brief hospital course: 67yo with h/o HTN, HLD, afib on Eliquis, and CVA who presented on 11/17 with generalized weakness.  He was found to have spinal stenosis with cord compression and neurosurgery is consulting with plan for surgery on 11/20.  He was started on Decadron.  Assessment and Plan:  Spinal stenosis with cord compression Acute on chronic back pain with numbness from the waist down bilaterally and difficulty ambulating MRI shows severe spinal stenosis at T11-T12 with cord compression and chronic severe spinal stenosis at L2-L5 Neurosurgery have been formally consulted and plans for surgery today (11/20) He was started on Decadron 4 mg IV every 6 hours Admitted to a surgical telemetry -> med surg PT to eval and treat Appreciate neurosurgery consultative services, will follow-up for any further recommendations   Paroxysmal fibrillation on chronic anticoagulation Patient appears to be in sinus rhythm at this time CHA2DS2-VASc score is 4 Hold Eliquis Heparin drip for bridging until procedure   Essential hypertension Continue Coreg   Hyperlipidemia Reports no longer taking atorvastatin  Depression Continue bupropion   Class 2 Obesity Body mass index is 41.6 kg/m.Marland Kitchen  Weight loss should be encouraged Outpatient PCP/bariatric medicine f/u encouraged      Consultants: Neurosurgery PT OT CIR  Procedures: T11-12, L2-3 decompression 11/20  Antibiotics: Cefazolin x 1  30 Day Unplanned Readmission Risk Score    Flowsheet Row ED to Hosp-Admission (Current) from 07/24/2023 in [facility]  30 Day Unplanned Readmission Risk Score (%) 9.42 Filed at 07/27/2023 0801       This score is the patient's risk of an unplanned readmission within 30 days of being discharged (0 -100%). The  score is based on dignosis, age, lab data, medications, orders, and past utilization.   Low:  0-14.9   Medium: 15-21.9   High: 22-29.9   Extreme: 30 and above           Subjective: Reports ongoing BLE numbness, pain.  Eager to have surgery today.   Objective: Vitals:   07/27/23 1446 07/27/23 1453  BP:  (!) 144/78  Pulse: 62   Resp: 18   Temp: 97.8 F (36.6 C)   SpO2: 98%    No intake or output data in the 24 hours ending 07/27/23 1708 Filed Weights   07/25/23 1400 07/27/23 1446  Weight: 108.5 kg 113.4 kg    Exam:  General:  Appears calm and comfortable and is in NAD, sitting up in the chair Eyes:  EOMI, normal lids, iris ENT:  grossly normal hearing, lips & tongue, mmm Neck:  no LAD, masses or thyromegaly Cardiovascular:  RRR, no m/r/g. No LE edema.  Respiratory:   CTA bilaterally with no wheezes/rales/rhonchi.  Normal respiratory effort. Abdomen:  soft, NT, ND Skin:  no rash or induration seen on limited exam Musculoskeletal:   no bony abnormality Lower extremity:  No LE edema.  Limited foot exam with no ulcerations.  2+ distal pulses. Psychiatric:  blunted mood and affect, speech fluent and appropriate, AOx3 Neurologic:  CN 2-12 grossly intact, moves all extremities in coordinated fashion  Data Reviewed: I have reviewed the patient's lab results since admission.  Pertinent labs for today include:   None today     Family Communication: Wife was present throughout evaluation  Disposition: Status is: Inpatient Remains inpatient appropriate because:  surgery today     Time spent: 50 minutes  Unresulted Labs (From admission, onward)     Start     Ordered   07/28/23 0500  CBC with Differential/Platelet  Tomorrow morning,   R       Question:  Specimen collection method  Answer:  Lab=Lab collect   07/27/23 1707   07/28/23 0500  Basic metabolic panel  Tomorrow morning,   R       Question:  Specimen collection method  Answer:  Lab=Lab collect   07/27/23  1707             Author: Jonah Blue, MD 07/27/2023 5:08 PM  For on call review [email or web address].

## 2023-07-27 NOTE — Progress Notes (Signed)
PT Cancellation Note  Patient Details Name: Cristian Gonzales MRN: [id number] DOB: [birth year]   Cancelled Treatment:    Reason Eval/Treat Not Completed: Patient at procedure or test/unavailable (to OR for spine surgery. will follwo up at later date/time as schedule allows and pt able.)   Renaldo Fiddler PT, DPT Acute Rehabilitation Services Office [phone]  07/27/23 2:43 PM

## 2023-07-27 NOTE — Hospital Course (Signed)
67yo with h/o HTN, HLD, afib on Eliquis, and CVA who presented on 11/17 with generalized weakness.  He was found to have spinal stenosis with cord compression and neurosurgery is consulting with plan for surgery on 11/20.  He was started on Decadron.

## 2023-07-27 NOTE — Anesthesia Preprocedure Evaluation (Signed)
Anesthesia Evaluation  Patient identified by MRN, date of birth, ID band Patient awake    Reviewed: Allergy & Precautions, NPO status , Patient's Chart, lab work & pertinent test results  Airway Mallampati: II  TM Distance: >3 FB Neck ROM: Full    Dental no notable dental hx.    Pulmonary asthma , Current Smoker   Pulmonary exam normal        Cardiovascular hypertension, Pt. on medications and Pt. on home beta blockers +CHF  + dysrhythmias Atrial Fibrillation  Rhythm:Regular Rate:Normal     Neuro/Psych   Anxiety Depression    CVA    GI/Hepatic negative GI ROS, Neg liver ROS,,,  Endo/Other  negative endocrine ROS    Renal/GU negative Renal ROS  negative genitourinary   Musculoskeletal  (+) Arthritis , Osteoarthritis,    Abdominal Normal abdominal exam  (+)   Peds  Hematology negative hematology ROS (+)   Anesthesia Other Findings   Reproductive/Obstetrics                             Anesthesia Physical Anesthesia Plan  ASA: 3  Anesthesia Plan: General   Post-op Pain Management: Toradol IV (intra-op)* and Ofirmev IV (intra-op)*   Induction: Intravenous  PONV Risk Score and Plan: 1 and Ondansetron, Dexamethasone and Treatment may vary due to age or medical condition  Airway Management Planned: Mask and Oral ETT  Additional Equipment: None  Intra-op Plan:   Post-operative Plan: Extubation in OR  Informed Consent: I have reviewed the patients History and Physical, chart, labs and discussed the procedure including the risks, benefits and alternatives for the proposed anesthesia with the patient or authorized representative who has indicated his/her understanding and acceptance.     Dental advisory given  Plan Discussed with: CRNA  Anesthesia Plan Comments:        Anesthesia Quick Evaluation

## 2023-07-27 NOTE — Op Note (Signed)
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  Postop diagnosis: Same.  Procedure: Decompressive thoracic laminectomy T11-12 with partial facetectomies and foraminotomies of the T11-T12 nerve roots.  Surgeon: Donalee Citrin.  Assistant: Julien Girt.  Anesthesia: General.  EBL: Minimal.  HPI: 67 year old M admitted to the hospital with inability to walk workup revealed severe cord compression with signal change in his cord with stenosis at T11-12.  Due to patient's progression of clinical syndrome imaging findings and failed conservative treatment I recommended decompressive thoracic laminectomy at T11-12.  We extensively reviewed the risks and benefits of the operation with patient as well as perioperative course expectations of outcome and alternatives of surgery and he understood and agreed to proceed forward.  Operative procedure: Patient was brought into the OR was induced under general anesthesia positioned prone the Wilson frame his back was prepped and draped in routine sterile fashion.  AP fluoroscopy confirmed identification rate level which shows the displaced between the T11 and T12 rib on AP imaging.  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  Marching laterally under biting the gutter performed foraminotomies of the T11-T12 nerve roots and decompress the central canal as well as the foramina.  We did this on both sides my assistant was there and assisted with decompression through all parts of the case.  At the end decompression I took another imaging confirming cephalocaudal margins and then wound was copiously irrigated hemostasis was maintained Gelfoam was ON top of the dura the muscle fascia approximate layers were active Vicryl skin  closed running 4 subcuticular Dermabond benzoin Steri-Strips and sterile dressing was applied patient recovery in stable condition.  At the end the case all needle count sponge counts were correct.

## 2023-07-27 NOTE — Transfer of Care (Signed)
Immediate Anesthesia Transfer of Care Note  Patient: Cristian Gonzales  Procedure(s) Performed: Thoracic Eleven-Twelve Decompression (Back)  Patient Location: PACU  Anesthesia Type:General  Level of Consciousness: awake, alert , and patient cooperative  Airway & Oxygen Therapy: Patient Spontanous Breathing and Patient connected to face mask oxygen  Post-op Assessment: Report given to RN and Post -op Vital signs reviewed and stable  Post vital signs: Reviewed and stable  Last Vitals:  Vitals Value Taken Time  BP 143/94 07/27/23 1823  Temp See documentation by PACU RN   Pulse 78 07/27/23 1826  Resp 19 07/27/23 1826  SpO2 99 % 07/27/23 1826  Vitals shown include unfiled device data.  Last Pain:  Vitals:   07/27/23 1446  TempSrc: Oral  PainSc: 7       Patients Stated Pain Goal: 4 (07/27/23 1446)  Complications: No notable events documented.

## 2023-07-27 NOTE — Telephone Encounter (Signed)
Can you try to send this. It keeps printing on my end.

## 2023-07-27 NOTE — Progress Notes (Signed)
Patient ID: Cristian Gonzales, male   DOB: Sep 28, 1955, 67 y.o.   MRN: [id number] Patient with improved pain and lower extremities and strength.  Strength 5 out of 5 lower extremities bilaterally  Patient with severe thoracic cord compression signal changes cord myelopathy and difficulty walking.  Patient improved on steroids presents for decompressive thoracic laminectomy.  He also has severe stenosis at L2-3 L3-4 and L4-5 however this has been stable and actually might be slightly improved from an MRI scan that he had back in June where it showed either a synovial cyst or possibly even epidural fluid collection that seems to be partially resolved so I have and I recommend leaving that alone and just decompressing his spinal cord to facilitate his proximal leg weakness and walking.  We have extensively gone over the risks and benefits of the operation with him as well as perioperative course expectations of outcome and alternatives to surgery and he understood and agreed to proceed forward.

## 2023-07-28 ENCOUNTER — Encounter (HOSPITAL_COMMUNITY): Payer: Self-pay | Admitting: Neurosurgery

## 2023-07-28 LAB — BASIC METABOLIC PANEL
Anion gap: 9 (ref 5–15)
BUN: 28 mg/dL — ABNORMAL HIGH (ref 8–23)
CO2: 24 mmol/L (ref 22–32)
Calcium: 8.2 mg/dL — ABNORMAL LOW (ref 8.9–10.3)
Chloride: 103 mmol/L (ref 98–111)
Creatinine, Ser: 1.44 mg/dL — ABNORMAL HIGH (ref 0.61–1.24)
GFR, Estimated: 53 mL/min — ABNORMAL LOW (ref >60)
Glucose, Bld: 141 mg/dL — ABNORMAL HIGH (ref 70–99)
Potassium: 4.4 mmol/L (ref 3.5–5.1)
Sodium: 136 mmol/L (ref 135–145)

## 2023-07-28 LAB — CBC WITH DIFFERENTIAL/PLATELET
Abs Immature Granulocytes: 0.11 10*3/uL — ABNORMAL HIGH (ref 0.00–0.07)
Basophils Absolute: 0 10*3/uL (ref 0.0–0.1)
Basophils Relative: 0 %
Eosinophils Absolute: 0 10*3/uL (ref 0.0–0.5)
Eosinophils Relative: 0 %
HCT: 41 % (ref 39.0–52.0)
Hemoglobin: 13.1 g/dL (ref 13.0–17.0)
Immature Granulocytes: 1 %
Lymphocytes Relative: 5 %
Lymphs Abs: 0.7 10*3/uL (ref 0.7–4.0)
MCH: 29 pg (ref 26.0–34.0)
MCHC: 32 g/dL (ref 30.0–36.0)
MCV: 90.7 fL (ref 80.0–100.0)
Monocytes Absolute: 1.3 10*3/uL — ABNORMAL HIGH (ref 0.1–1.0)
Monocytes Relative: 9 %
Neutro Abs: 13.3 10*3/uL — ABNORMAL HIGH (ref 1.7–7.7)
Neutrophils Relative %: 85 %
Platelets: 276 10*3/uL (ref 150–400)
RBC: 4.52 MIL/uL (ref 4.22–5.81)
RDW: 13.6 % (ref 11.5–15.5)
WBC: 15.4 10*3/uL — ABNORMAL HIGH (ref 4.0–10.5)
nRBC: 0 % (ref 0.0–0.2)

## 2023-07-28 MED ORDER — OXYCODONE-ACETAMINOPHEN 5-325 MG PO TABS: 1.0000 | ORAL_TABLET | Freq: Four times a day (QID) | ORAL | 0 refills | Status: DC | PRN

## 2023-07-28 MED ORDER — METHOCARBAMOL 750 MG PO TABS: 750.0000 mg | ORAL_TABLET | Freq: Four times a day (QID) | ORAL | 0 refills | Status: DC

## 2023-07-28 NOTE — Anesthesia Postprocedure Evaluation (Signed)
Anesthesia Post Note  Patient: [name]  Procedure(s) Performed: Thoracic Eleven-Twelve Decompression (Back)     Patient location during evaluation: PACU Anesthesia Type: General Level of consciousness: awake and alert Pain management: pain level controlled Vital Signs Assessment: post-procedure vital signs reviewed and stable Respiratory status: spontaneous breathing, nonlabored ventilation, respiratory function stable and patient connected to nasal cannula oxygen Cardiovascular status: blood pressure returned to baseline and stable Postop Assessment: no apparent nausea or vomiting Anesthetic complications: no   No notable events documented.  Last Vitals:  Vitals:   07/27/23 2000 07/27/23 2131  BP: 129/86 125/70  Pulse: 62 64  Resp: 10   Temp: (!) 36.2 C 37 C  SpO2: 99% 97%    Last Pain:  Vitals:   07/28/23 0845  TempSrc:   PainSc: 7                  [name]

## 2023-07-28 NOTE — Progress Notes (Signed)
    Durable Medical Equipment  (From admission, onward)           Start     Ordered   07/28/23 1120  For home use only DME 3 n 1  Once        07/28/23 1119   07/28/23 1118  For home use only DME Bedside commode  Once       Comments: Patient confined to one room  Question:  Patient needs a bedside commode to treat with the following condition  Answer:  Thoracic myelopathy   07/28/23 1118   07/28/23 1116  For home use only DME 3 n 1  Once        07/28/23 1116   07/28/23 1116  For home use only DME Other see comment  Once       Comments: Tub Bench  Question:  Length of Need  Answer:  12 Months   07/28/23 1116   07/28/23 0744  For home use only DME Walker rolling  Once       Question Answer Comment  Walker: With 5 Inch Wheels   Patient needs a walker to treat with the following condition Thoracic myelopathy      07/28/23 0743

## 2023-07-28 NOTE — Progress Notes (Signed)
This nurse dicussed discharge instructions with pt. IV removed and intact. Pt and wife states understanding and denies any questions or concerns at discharge. Pt wheeled to personal car. Pharmacy verified with pt.

## 2023-07-28 NOTE — Evaluation (Addendum)
Occupational Therapy Evaluation Patient Details Name: Cristian Gonzales MRN: [id number] DOB: Apr 15, 1956 Today's Date: 07/28/2023   History of Present Illness Patient is 67 y.o. male presented to ED with c/o chest pain and bil LE weakness with pain in UE's. Pt reports bil LE numbness & tingling. CT of head normal. MRI of lumbar spine shows severe spinal stenosis at T11-T12 with cord compression. He has chronic severe spinal stenosis from L2-L5 as well. Neurosurgery consulted and pt now s/p T11-12 laminectomy for decompression on 07/27/23. PMH significant for anxiety, arthritis, afib, CVA, R TKR, cervical discectomy.   Clinical Impression   PTA patient independent and driving. Admitted for above and presents with problem list below.  Educated on back precautions, ADL compensatory techniques, DME/AE recommendations, mobility progression and safety.  He was able to recall back precautions but requires intermittent cueing to adhere to during functional tasks.  Patient completes transfers with min guard, functional mobility using RW with room with min guard, and ADLs with setup to min guard assist after education of LB AE. Pt will have intermittent support of spouse, educated on showering with supervision and spouse agreeable. Will follow acutely to optimize independence, safety with ADLS/mobility.  Anticipate no further needs after dc home.       If plan is discharge home, recommend the following: A little help with walking and/or transfers;A little help with bathing/dressing/bathroom;Assistance with cooking/housework;Help with stairs or ramp for entrance;Assist for transportation    Functional Status Assessment  Patient has had a recent decline in their functional status and demonstrates the ability to make significant improvements in function in a reasonable and predictable amount of time.  Equipment Recommendations  BSC/3in1;Tub/shower bench;Other (comment) (RW)    Recommendations for Other  Services       Precautions / Restrictions Precautions Precautions: Fall, Back Precaution Booklet Issued: Yes (comment) Precaution Comments: "BLT" Required Braces or Orthoses:  (no brace needed) Restrictions Weight Bearing Restrictions: No      Mobility Bed Mobility Overal bed mobility: Needs Assistance Bed Mobility: Rolling, Sidelying to Sit Rolling: Supervision Sidelying to sit: Contact guard assist       General bed mobility comments: pt educated on technique, flat bed with min guard to fully ascend trunk to upright    Transfers Overall transfer level: Needs assistance Equipment used: Rolling walker (2 wheels) Transfers: Sit to/from Stand Sit to Stand: Contact guard assist           General transfer comment: cueing for hand placement and posture, increased time and effort to power up      Balance Overall balance assessment: Needs assistance Sitting-balance support: No upper extremity supported, Feet supported Sitting balance-Leahy Scale: Good Sitting balance - Comments: limited dynamically   Standing balance support: During functional activity, Bilateral upper extremity supported, Single extremity supported Standing balance-Leahy Scale: Poor Standing balance comment: relies on BUE support dynamically but able to engage in ADls with 0-1 hand support briefly with min guard                           ADL either performed or assessed with clinical judgement   ADL Overall ADL's : Needs assistance/impaired     Grooming: Contact guard assist;Standing;Wash/dry hands           Upper Body Dressing : Set up;Sitting   Lower Body Dressing: Sit to/from stand;Contact guard assist;With adaptive equipment Lower Body Dressing Details (indicate cue type and reason): pt educated on AE LB dressing, able  to return demonstrate use of reacher.  Therapist educated on sock aide and long shoe horn. Min guard in standing Toilet Transfer: Contact guard  assist;Ambulation;Rolling walker (2 wheels)         Tub/Shower Transfer Details (indicate cue type and reason): verbally educated and thearpist demonstrated reverse step technique into bathtub, further education provided on TTB.  preference to TTB. Functional mobility during ADLs: Contact guard assist;Rolling walker (2 wheels)       Vision   Vision Assessment?: No apparent visual deficits     Perception         Praxis         Pertinent Vitals/Pain Pain Assessment Pain Assessment: Faces Faces Pain Scale: Hurts little more Pain Location: back Pain Descriptors / Indicators: Discomfort, Grimacing, Operative site guarding Pain Intervention(s): Monitored during session, Limited activity within patient's tolerance, Repositioned, Premedicated before session     Extremity/Trunk Assessment Upper Extremity Assessment Upper Extremity Assessment: WFL    Cervical / Trunk Assessment Cervical / Trunk Assessment: Back Surgery   Communication Communication Communication: No apparent difficulties   Cognition Arousal: Alert Behavior During Therapy: WFL for tasks assessed/performed Overall Cognitive Status: Within Functional Limits for tasks assessed                                       General Comments  spouse at side and supportive    Exercises     Shoulder Instructions      Home Living Family/patient expects to be discharged to:: Private residence Living Arrangements: Spouse/significant other Available Help at Discharge: Family Type of Home: House Home Access: Stairs to enter Secretary/administrator of Steps: 1   Home Layout: One level     Bathroom Shower/Tub: Chief Strategy Officer: Standard Bathroom Accessibility: No   Home Equipment: Hand held shower head          Prior Functioning/Environment Prior Level of Function : Independent/Modified Independent;Driving;History of Falls (last six months)             Mobility  Comments: ind with no AD, 2 falls in last week ADLs Comments: ind with ADL's. spouse assists with iADLS        OT Problem List: Decreased strength;Decreased activity tolerance;Impaired balance (sitting and/or standing);Pain;Decreased knowledge of precautions;Decreased knowledge of use of DME or AE      OT Treatment/Interventions: Self-care/ADL training;DME and/or AE instruction;Therapeutic activities;Patient/family education;Balance training    OT Goals(Current goals can be found in the care plan section) Acute Rehab OT Goals Patient Stated Goal: home OT Goal Formulation: With patient Time For Goal Achievement: 08/11/23 Potential to Achieve Goals: Good  OT Frequency: Min 1X/week    Co-evaluation              AM-PAC OT "6 Clicks" Daily Activity     Outcome Measure Help from another person eating meals?: None Help from another person taking care of personal grooming?: A Little Help from another person toileting, which includes using toliet, bedpan, or urinal?: A Little Help from another person bathing (including washing, rinsing, drying)?: A Little Help from another person to put on and taking off regular upper body clothing?: A Little Help from another person to put on and taking off regular lower body clothing?: A Little 6 Click Score: 19   End of Session Equipment Utilized During Treatment: Rolling walker (2 wheels) Nurse Communication: Mobility status  Activity Tolerance: Patient tolerated  treatment well Patient left: with call bell/phone within reach;Other (comment) (EOB with PT present)  OT Visit Diagnosis: Other abnormalities of gait and mobility (R26.89);Pain;History of falling (Z91.81) Pain - part of body:  (back)                Time: 1000-1025 OT Time Calculation (min): 25 min Charges:  OT General Charges $OT Visit: 1 Visit OT Evaluation $OT Eval Moderate Complexity: 1 Mod OT Treatments $Self Care/Home Management : 8-22 mins  Barry Brunner, OT Acute  Rehabilitation Services Office [phone]   Chancy Milroy 07/28/2023, 1:32 PM

## 2023-07-28 NOTE — Plan of Care (Signed)

## 2023-07-28 NOTE — Progress Notes (Signed)
Physical Therapy Re-Evaluation/Treatment Patient Details Name: Cristian Gonzales MRN: [id number] DOB: 12-29-1955 Today's Date: 07/28/2023  History of Present Illness  Patient is 67 y.o. male presented to ED with c/o chest pain and bil LE weakness with pain in UE's. Pt reports bil LE numbness & tingling. CT of head normal. MRI of lumbar spine shows severe spinal stenosis at T11-T12 with cord compression. He has chronic severe spinal stenosis from L2-L5 as well. Neurosurgery consulted and pt now s/p T11-12 laminectomy for decompression on 07/27/23. PMH significant for anxiety, arthritis, afib, CVA, R TKR, cervical discectomy.   Clinical Impression  Patient making good progress since surgery yesterday and re-evaluation this visit. Patient noted to have improved Rt LE strength and reports improvement in bil LE sensation. Pt able to recall 2/3 spinal precautions following visit with OT and required cues to recall 3/3 "BLT". Pt able to state precautions at end of session without cuing. Pt amb ~100' x3 with seated rest between bouts. Pt was educated on stair negotiation with RW and completed single curb step up/down with CGA for safety. Pt instructed on tub bench transfer and completed with CGA. Recommend pt have Tub bench as he is a high fall risk with SLS to step into/out of tub. Pt will be alone confined to one room for extended periods of the day and will benefit form BSC for safe toileting. Pt will also require RW to complete transfers and reduce fall risk with ambulation. Recommend HHPT follow up and will continue to progress pt as able during stay.      If plan is discharge home, recommend the following: A little help with walking and/or transfers;Assistance with cooking/housework;Assist for transportation;Help with stairs or ramp for entrance   Can travel by private vehicle        Equipment Recommendations Rolling walker (2 wheels);BSC/3in1 (Tub Bench)  Recommendations for Other Services        Functional Status Assessment Patient has had a recent decline in their functional status and demonstrates the ability to make significant improvements in function in a reasonable and predictable amount of time.     Precautions / Restrictions Precautions Precautions: Fall Precaution Comments: "BLT" Required Braces or Orthoses:  (no brace needed) Restrictions Weight Bearing Restrictions: No      Mobility  Bed Mobility               General bed mobility comments: pt seated EOB with OT at start    Transfers Overall transfer level: Needs assistance Equipment used: Rolling walker (2 wheels) Transfers: Sit to/from Stand Sit to Stand: Contact guard assist, Supervision                Ambulation/Gait Ambulation/Gait assistance: Contact guard assist, Supervision Gait Distance (Feet): 100 Feet (3x100) Assistive device: Rolling walker (2 wheels) Gait Pattern/deviations: Step-through pattern, Decreased step length - right, Decreased stride length, Shuffle Gait velocity: fair        Stairs            Wheelchair Mobility     Tilt Bed    Modified Rankin (Stroke Patients Only)       Balance Overall balance assessment: Needs assistance Sitting-balance support: No upper extremity supported, Feet supported Sitting balance-Leahy Scale: Good     Standing balance support: During functional activity, Reliant on assistive device for balance, Bilateral upper extremity supported Standing balance-Leahy Scale: Poor Standing balance comment: 2 falls week of admission, reliant on RW for support  Pertinent Vitals/Pain Pain Assessment Pain Assessment: Faces Faces Pain Scale: Hurts little more Pain Location: back Pain Descriptors / Indicators: Discomfort Pain Intervention(s): Limited activity within patient's tolerance, Monitored during session, Repositioned    Home Living Family/patient expects to be discharged to:: Private  residence Living Arrangements: Spouse/significant other Available Help at Discharge: Family Type of Home: House Home Access: Stairs to enter   Secretary/administrator of Steps: 1   Home Layout: One level Home Equipment: Hand held shower head      Prior Function Prior Level of Function : Independent/Modified Independent;Driving;History of Falls (last six months)             Mobility Comments: ind with no AD, 2 falls in last week ADLs Comments: ind with ADL's. spouse assists with iADLS     Extremity/Trunk Assessment   Upper Extremity Assessment Upper Extremity Assessment: Defer to OT evaluation    Lower Extremity Assessment Lower Extremity Assessment: RLE deficits/detail;LLE deficits/detail RLE Deficits / Details: hip flex 4/5; quad 4+/5; hamstring 4+/5; DF 4/5; PF 4/5 (on eval: hip flex 3+/5; quad 4+/5; hamstring 4/5; DF 3+/5; PF 4-/5) RLE Sensation:  (pt reports reduced tingling improved light touch) RLE Coordination: decreased gross motor (greatly limited by ROM deficits at knee) LLE Deficits / Details: hip flex 4+/5; quad & hamstring 4+/5; DF 4+/5; PF 4+/5 (on eval: hip flex 4/5; quad & hamstring 4+/5; DF 4+/5; PF 4+/5) LLE Sensation:  (pt reports reduced tingling improved light touch) LLE Coordination: WNL    Cervical / Trunk Assessment Cervical / Trunk Assessment: Back Surgery  Communication   Communication Communication: No apparent difficulties  Cognition Arousal: Alert Behavior During Therapy: WFL for tasks assessed/performed, Flat affect (slighlty flat) Overall Cognitive Status: Within Functional Limits for tasks assessed                                 General Comments: slightly flat however likely baseline personality/sense of humor. Overall pt cognitively intact, reports some ongoing difficulties with his emotions since CVA in 2022 reporting sometimes gets irritable or emotional/sad more easily than he used to.        General Comments       Exercises     Assessment/Plan    PT Assessment Patient needs continued PT services  PT Problem List Decreased strength;Decreased range of motion;Decreased activity tolerance;Decreased balance;Decreased mobility;Decreased coordination;Decreased knowledge of use of DME;Decreased safety awareness;Decreased knowledge of precautions;Cardiopulmonary status limiting activity;Obesity;Impaired sensation       PT Treatment Interventions DME instruction;Gait training;Stair training;Functional mobility training;Therapeutic activities;Therapeutic exercise;Balance training;Neuromuscular re-education;Cognitive remediation;Patient/family education;Wheelchair mobility training    PT Goals (Current goals can be found in the Care Plan section)  Acute Rehab PT Goals Patient Stated Goal: recover and maintain independence PT Goal Formulation: With patient/family Time For Goal Achievement: 08/08/23 Potential to Achieve Goals: Good    Frequency Min 1X/week     Co-evaluation               AM-PAC PT "6 Clicks" Mobility  Outcome Measure Help needed turning from your back to your side while in a flat bed without using bedrails?: A Little Help needed moving from lying on your back to sitting on the side of a flat bed without using bedrails?: A Little Help needed moving to and from a bed to a chair (including a wheelchair)?: A Little Help needed standing up from a chair using your arms (e.g., wheelchair or bedside chair)?: A  Little Help needed to walk in hospital room?: A Little Help needed climbing 3-5 steps with a railing? : A Little 6 Click Score: 18    End of Session Equipment Utilized During Treatment: Gait belt Activity Tolerance: Patient tolerated treatment well Patient left: in chair;with call bell/phone within reach;with family/visitor present Nurse Communication: Mobility status PT Visit Diagnosis: Unsteadiness on feet (R26.81);Other abnormalities of gait and mobility (R26.89);Muscle  weakness (generalized) (M62.81);Difficulty in walking, not elsewhere classified (R26.2);Other symptoms and signs involving the nervous system (R29.898)    Time: 8469-6295 PT Time Calculation (min) (ACUTE ONLY): 26 min   Charges:   PT Evaluation $PT Re-evaluation: 1 Re-eval PT Treatments $Gait Training: 8-22 mins PT General Charges $$ ACUTE PT VISIT: 1 Visit         Wynn Maudlin, DPT Acute Rehabilitation Services Office [phone]  07/28/23 12:21 PM

## 2023-07-28 NOTE — Discharge Summary (Addendum)
Physician Discharge Summary  Patient ID: Cristian Gonzales MRN: [id number] DOB/AGE: [birth year] 67 y.o.  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    Discharge Diagnoses: same   Discharged Condition: good  Hospital Course: The patient was admitted on 07/24/2023 and taken to the operating room where the patient underwent T11-12 laminectomy. The patient tolerated the procedure well and was taken to the recovery room and then to the floor in stable condition. The hospital course was routine. There were no complications. The wound remained clean dry and intact. Pt had appropriate back soreness. No complaints of leg pain or new N/T/W. The patient remained afebrile with stable vital signs, and tolerated a regular diet. The patient continued to increase activities, and pain was well controlled with oral pain medications.   Consults: None  Significant Diagnostic Studies:  Results for orders placed or performed during the hospital encounter of 07/24/23  Surgical pcr screen   Specimen: Nasal Mucosa; Nasal Swab  Result Value Ref Range   MRSA, PCR NEGATIVE NEGATIVE   Staphylococcus aureus NEGATIVE NEGATIVE  Basic metabolic panel  Result Value Ref Range   Sodium 137 135 - 145 mmol/L   Potassium 3.8 3.5 - 5.1 mmol/L   Chloride 102 98 - 111 mmol/L   CO2 24 22 - 32 mmol/L   Glucose, Bld 146 (H) 70 - 99 mg/dL   BUN 18 8 - 23 mg/dL   Creatinine, Ser 1.32 0.61 - 1.24 mg/dL   Calcium 9.6 8.9 - 44.0 mg/dL   GFR, Estimated >10 >27 mL/min   Anion gap 11 5 - 15  CBC  Result Value Ref Range   WBC 11.5 (H) 4.0 - 10.5 K/uL   RBC 4.94 4.22 - 5.81 MIL/uL   Hemoglobin 14.7 13.0 - 17.0 g/dL   HCT 25.3 66.4 - 40.3 %   MCV 89.7 80.0 - 100.0 fL   MCH 29.8 26.0 - 34.0 pg   MCHC 33.2 30.0 - 36.0 g/dL   RDW 47.4 25.9 - 56.3 %   Platelets 311 150 - 400 K/uL   nRBC 0.0 0.0 - 0.2 %  Urinalysis, Routine w reflex microscopic  -Urine, Clean Catch  Result Value Ref Range   Color, Urine YELLOW YELLOW   APPearance CLEAR CLEAR   Specific Gravity, Urine 1.017 1.005 - 1.030   pH 5.0 5.0 - 8.0   Glucose, UA NEGATIVE NEGATIVE mg/dL   Hgb urine dipstick NEGATIVE NEGATIVE   Bilirubin Urine NEGATIVE NEGATIVE   Ketones, ur NEGATIVE NEGATIVE mg/dL   Protein, ur NEGATIVE NEGATIVE mg/dL   Nitrite NEGATIVE NEGATIVE   Leukocytes,Ua TRACE (A) NEGATIVE   RBC / HPF 0-5 0 - 5 RBC/hpf   WBC, UA 0-5 0 - 5 WBC/hpf   Bacteria, UA NONE SEEN NONE SEEN   Squamous Epithelial / HPF 0-5 0 - 5 /HPF   Mucus PRESENT   Magnesium  Result Value Ref Range   Magnesium 2.2 1.7 - 2.4 mg/dL  HIV Antibody (routine testing w rflx)  Result Value Ref Range   HIV Screen 4th Generation wRfx Non Reactive Non Reactive  CBC  Result Value Ref Range   WBC 7.5 4.0 - 10.5 K/uL   RBC 4.84 4.22 - 5.81 MIL/uL   Hemoglobin 14.4 13.0 - 17.0 g/dL   HCT 87.5 64.3 - 32.9 %   MCV 88.6 80.0 - 100.0 fL   MCH 29.8 26.0 - 34.0 pg   MCHC 33.6 30.0 - 36.0  g/dL   RDW 82.9 56.2 - 13.0 %   Platelets 294 150 - 400 K/uL   nRBC 0.0 0.0 - 0.2 %  Comprehensive metabolic panel  Result Value Ref Range   Sodium 136 135 - 145 mmol/L   Potassium 4.2 3.5 - 5.1 mmol/L   Chloride 105 98 - 111 mmol/L   CO2 23 22 - 32 mmol/L   Glucose, Bld 159 (H) 70 - 99 mg/dL   BUN 19 8 - 23 mg/dL   Creatinine, Ser 8.65 0.61 - 1.24 mg/dL   Calcium 9.0 8.9 - 78.4 mg/dL   Total Protein 6.3 (L) 6.5 - 8.1 g/dL   Albumin 3.5 3.5 - 5.0 g/dL   AST 17 15 - 41 U/L   ALT 22 0 - 44 U/L   Alkaline Phosphatase 70 38 - 126 U/L   Total Bilirubin 0.7 <1.2 mg/dL   GFR, Estimated >69 >62 mL/min   Anion gap 8 5 - 15  Heparin level (unfractionated)  Result Value Ref Range   Heparin Unfractionated 0.40 0.30 - 0.70 IU/mL  APTT  Result Value Ref Range   aPTT 34 24 - 36 seconds  Heparin level (unfractionated)  Result Value Ref Range   Heparin Unfractionated 0.45 0.30 - 0.70 IU/mL  APTT  Result Value  Ref Range   aPTT 42 (H) 24 - 36 seconds  CBC with Differential/Platelet  Result Value Ref Range   WBC 15.4 (H) 4.0 - 10.5 K/uL   RBC 4.52 4.22 - 5.81 MIL/uL   Hemoglobin 13.1 13.0 - 17.0 g/dL   HCT 95.2 84.1 - 32.4 %   MCV 90.7 80.0 - 100.0 fL   MCH 29.0 26.0 - 34.0 pg   MCHC 32.0 30.0 - 36.0 g/dL   RDW 40.1 02.7 - 25.3 %   Platelets 276 150 - 400 K/uL   nRBC 0.0 0.0 - 0.2 %   Neutrophils Relative % 85 %   Neutro Abs 13.3 (H) 1.7 - 7.7 K/uL   Lymphocytes Relative 5 %   Lymphs Abs 0.7 0.7 - 4.0 K/uL   Monocytes Relative 9 %   Monocytes Absolute 1.3 (H) 0.1 - 1.0 K/uL   Eosinophils Relative 0 %   Eosinophils Absolute 0.0 0.0 - 0.5 K/uL   Basophils Relative 0 %   Basophils Absolute 0.0 0.0 - 0.1 K/uL   Immature Granulocytes 1 %   Abs Immature Granulocytes 0.11 (H) 0.00 - 0.07 K/uL  Basic metabolic panel  Result Value Ref Range   Sodium 136 135 - 145 mmol/L   Potassium 4.4 3.5 - 5.1 mmol/L   Chloride 103 98 - 111 mmol/L   CO2 24 22 - 32 mmol/L   Glucose, Bld 141 (H) 70 - 99 mg/dL   BUN 28 (H) 8 - 23 mg/dL   Creatinine, Ser 6.64 (H) 0.61 - 1.24 mg/dL   Calcium 8.2 (L) 8.9 - 10.3 mg/dL   GFR, Estimated 53 (L) >60 mL/min   Anion gap 9 5 - 15  CBG monitoring, ED  Result Value Ref Range   Glucose-Capillary 163 (H) 70 - 99 mg/dL  I-Stat CG4 Lactic Acid, ED  Result Value Ref Range   Lactic Acid, Venous 1.4 0.5 - 1.9 mmol/L  Troponin I (High Sensitivity)  Result Value Ref Range   Troponin I (High Sensitivity) 7 <18 ng/L  Troponin I (High Sensitivity)  Result Value Ref Range   Troponin I (High Sensitivity) 7 <18 ng/L    DG Lumbar Spine 2-3 Views  Result Date: 07/27/2023 CLINICAL DATA:  Elective surgery. EXAM: LUMBAR SPINE - 2-3 VIEW COMPARISON:  None Available. FINDINGS: Single fluoroscopic spot view of the thoracolumbar spine obtained in the operating room. Surgical instruments project over the T11 and T12 level. Fluoroscopy time 5 seconds. Dose 5.16 mGy. IMPRESSION:  Intraoperative fluoroscopy during thoracolumbar surgery. Electronically Signed   By: Narda Rutherford M.D.   On: 07/27/2023 21:48   DG C-Arm 1-60 Min-No Report  Result Date: 07/27/2023 Fluoroscopy was utilized by the requesting physician.  No radiographic interpretation.   MR Lumbar Spine W Wo Contrast  Result Date: 07/25/2023 CLINICAL DATA:  One week progressive worsening tingling and numbness in the bilateral lower extremities. EXAM: MRI LUMBAR SPINE WITHOUT AND WITH CONTRAST TECHNIQUE: Multiplanar and multiecho pulse sequences of the lumbar spine were obtained without and with intravenous contrast. CONTRAST:  10mL GADAVIST GADOBUTROL 1 MMOL/ML IV SOLN COMPARISON:  02/18/2023 FINDINGS: Segmentation:  Standard. Alignment:  Physiologic. Vertebrae:  No fracture, evidence of discitis, or bone lesion. Conus medullaris and cauda equina: Conus extends to the L2 level. Edematous signal in the cord at T11-12. There may be a new level of cord edema at T10-11, limited by partial coverage. Edema at this upper level would be new from prior. There is cauda equina redundancy from the degree of severe spinal stenosis. Paraspinal and other soft tissues: No perispinal mass or inflammation Disc levels: T10-11: Spondylosis and asymmetric left facet spurring with moderate left foraminal narrowing. The spinal canal appears patent on sagittal images. T11-12: Disc narrowing with central protrusion. Facet spurring ligamentum flavum thickening causes cord compression and T2 hyperintensity. Moderate left foraminal narrowing. T12- L1: Mild facet spurring L1-L2: Moderate facet spurring. Tiny bilateral paracentral protrusion L2-L3: Disc narrowing and bulging with endplate ridging. Degenerative facet spurring on both sides. Advanced thecal sac narrowing. L3-L4: Degenerative facet spurring and circumferential disc bulging with short pedicles causes compressive thecal sac stenosis. L4-L5: Central protrusion and circumferential disc  bulging. Bulky degenerative facet spurring and ligamentum flavum thickening. Advanced spinal stenosis. Moderate left foraminal impingement L5-S1:Bulging disc and facet spurring asymmetric to the left where there is foraminal impingement. Mild to moderate right foraminal narrowing mainly from facet spurring. Patent spinal canal. IMPRESSION: 1. Chronic, degenerative cord compression at T11-12. Cord edema is seen at this level. There may also be cord edema at the T10-11 level, which would be new from June 2024. Suggest thoracic imaging given there is history of proximal deficits. 2. Degeneration and short pedicles causes compressive spinal stenosis at L2-3 to L4-5. 3. Foraminal impingement on the left at T10-11, T11-12, L4-5, and L5-S1. Electronically Signed   By: Tiburcio Pea M.D.   On: 07/25/2023 07:01   DG Chest Portable 1 View  Result Date: 07/25/2023 CLINICAL DATA:  Chest pain EXAM: PORTABLE CHEST 1 VIEW COMPARISON:  02/06/2023 FINDINGS: The heart size and mediastinal contours are within normal limits. Both lungs are clear. The visualized skeletal structures are unremarkable. Loop recorder is noted. IMPRESSION: No acute abnormality noted. Electronically Signed   By: Alcide Clever M.D.   On: 07/25/2023 03:26   CT HEAD WO CONTRAST  Result Date: 07/24/2023 CLINICAL DATA:  Chest pain and syncopal episode, initial encounter EXAM: CT HEAD WITHOUT CONTRAST TECHNIQUE: Contiguous axial images were obtained from the base of the skull through the vertex without intravenous contrast. RADIATION DOSE REDUCTION: This exam was performed according to the departmental dose-optimization program which includes automated exposure control, adjustment of the mA and/or kV according to patient size and/or use of  iterative reconstruction technique. COMPARISON:  03/17/2021 FINDINGS: Brain: No evidence of acute infarction, hemorrhage, hydrocephalus, extra-axial collection or mass lesion/mass effect. Vascular: No hyperdense vessel  or unexpected calcification. Skull: Normal. Negative for fracture or focal lesion. Sinuses/Orbits: No acute finding. Other: None. IMPRESSION: No acute intracranial abnormality noted. Electronically Signed   By: Alcide Clever M.D.   On: 07/24/2023 21:55   CUP PACEART REMOTE DEVICE CHECK  Result Date: 07/11/2023 ILR summary report received. Battery status OK. Normal device function. No new symptom, tachy, brady, or pause episodes. No new AF episodes. AF burden is 0% of the time.  Monthly summary reports and ROV/PRN ML, CVRS   Antibiotics:  Anti-infectives (From admission, onward)    Start     Dose/Rate Route Frequency Ordered Stop   07/27/23 2300  ceFAZolin (ANCEF) IVPB 2g/100 mL premix        2 g 200 mL/hr over 30 Minutes Intravenous Every 8 hours 07/27/23 2038 07/28/23 1459   07/27/23 0600  ceFAZolin (ANCEF) IVPB 2g/100 mL premix        2 g 200 mL/hr over 30 Minutes Intravenous On call to O.R. 07/26/23 1918 07/27/23 1700       Discharge Exam: Blood pressure 125/70, pulse 64, temperature 98.6 F (37 C), temperature source Oral, resp. rate 10, height 5\' 5"  (1.651 m), weight 113.4 kg, SpO2 97%. Neurologic: Grossly normal Ambulating and voiding well incision cdi   Discharge Medications:   Allergies as of 07/28/2023       Reactions   Tramadol Other (See Comments)   Passed out   Metoprolol Other (See Comments)   " Made my head feel like it was going to blow up"    Morphine Other (See Comments)   Left red bumps on the back and did not work   Trazodone And Nefazodone Other (See Comments)   Made the patient feel flushed,with blurred vision and muscle cramps         Medication List     STOP taking these medications    apixaban 5 MG Tabs tablet Commonly known as: ELIQUIS   HYDROcodone bit-homatropine 5-1.5 MG/5ML syrup Commonly known as: HYCODAN   predniSONE 10 MG tablet Commonly known as: DELTASONE       TAKE these medications    acetaminophen 325 MG tablet Commonly  known as: TYLENOL Take 650 mg by mouth as needed for mild pain (pain score 1-3) or moderate pain (pain score 4-6).   atorvastatin 80 MG tablet Commonly known as: LIPITOR TAKE 1 TABLET EVERY DAY   buPROPion 150 MG 24 hr tablet Commonly known as: WELLBUTRIN XL TAKE 1 TABLET BY MOUTH EVERY DAY   carvedilol 3.125 MG tablet Commonly known as: COREG TAKE 1 TABLET BY MOUTH TWICE A DAY WITH FOOD What changed: See the new instructions.   ibuprofen 200 MG tablet Commonly known as: ADVIL Take 400 mg by mouth as needed for mild pain (pain score 1-3) or moderate pain (pain score 4-6).   methocarbamol 750 MG tablet Commonly known as: Robaxin-750 Take 1 tablet (750 mg total) by mouth 4 (four) times daily.   oxyCODONE-acetaminophen 5-325 MG tablet Commonly known as: PERCOCET/ROXICET Take 1-2 tablets by mouth every 6 (six) hours as needed for severe pain (pain score 7-10).       Restart eliquis in 3 days  Disposition: home   Final Dx: T11-12 laminectomy   Discharge Instructions      Remove dressing in 72 hours   Complete by: As directed  Call MD for:  difficulty breathing, headache or visual disturbances   Complete by: As directed    Call MD for:  hives   Complete by: As directed    Call MD for:  persistant dizziness or light-headedness   Complete by: As directed    Call MD for:  persistant nausea and vomiting   Complete by: As directed    Call MD for:  redness, tenderness, or signs of infection (pain, swelling, redness, odor or green/yellow discharge around incision site)   Complete by: As directed    Call MD for:  severe uncontrolled pain   Complete by: As directed    Call MD for:  temperature >100.4   Complete by: As directed    Diet - low sodium heart healthy   Complete by: As directed    Driving Restrictions   Complete by: As directed    No driving for 2 weeks, no riding in the car for 1 week   Increase activity slowly   Complete by: As directed    Lifting  restrictions   Complete by: As directed    No lifting more than 8 lbs        Follow-up Information     Shirline Frees, NP Follow up.   Specialty: Family Medicine Contact information: [address] [place] [address] [phone]                  Signed: Tiana Loft [facility] 07/28/2023, 7:42 AM

## 2023-07-28 NOTE — TOC Transition Note (Addendum)
Transition of Care [facility]) - CM/SW Discharge Note   Patient Details  Name: Cristian Gonzales MRN: [id number] Date of Birth: 04-May-1956  [facility] CM/SW Contact:  Epifanio Lesches, RN Phone Number: 07/28/2023, 1:22 PM   Clinical Narrative:    Patient will DC to: home Anticipated DC date: 07/28/2023 Family notified: yes Transport by: car  Thoracic myelopathy from severe stenosis T11-12    S/P decompressive thoracic laminectomy T11-12 , 11/20       Per MD patient ready for DC today. RN, patient, and patient's wife notified of discharge.Pt states wife will assist with care once home if needed. Orders noted for home health services and DME( RW, transfer bench and BSC). Pt agreeable to services. Pt without provider preference. Referral made with [facility] and accepted. Referral made with [employer] for DME. [employer] stated pt will have a high deductible and copay cost. Information explained to pt and pt declined equipment. Stated he will  get RW himself. Pt without RX med concerns. Pt will pick up medications from local pharmacy. Wife to provide transportation to home. Post hospital f/u noted on AVS.  RNCM will sign off for now as intervention is no longer needed. Please consult [place] again if new needs arise.   07/28/2023 @ 1400 NCM made aware by [facility],home health services will be delayed until next Thursday. Referral cancelled with [facility] .  New referral made with [facility] and accepted,start of care with 48 hours post discharge.    Barriers to Discharge: No Barriers Identified   Patient Goals and CMS Choice   Choice offered to / list presented to : Patient  Discharge Placement                         Discharge Plan and Services Additional resources added to the After Visit Summary for                  DME Arranged: Bedside commode, Walker rolling, Tub bench DME Agency: [facility] Date DME Agency Contacted:  07/28/23 Time DME Agency Contacted: 1308 Representative spoke with at DME Agency: [facility] HH Arranged: PT HH Agency: [facility] Date [facility] Agency Contacted: 07/28/23 Time HH Agency Contacted: 1320 Representative spoke with at [facility] Agency: [place]  Social Determinants of Health (SDOH) Interventions SDOH Screenings   Food Insecurity: No Food Insecurity (07/25/2023)  Housing: Low Risk  (07/25/2023)  Transportation Needs: No Transportation Needs (07/25/2023)  Utilities: Not At Risk (07/25/2023)  Alcohol Screen: Low Risk  (09/21/2022)  Depression (PHQ2-9): High Risk (12/08/2022)  Financial Resource Strain: Low Risk  (09/21/2022)  Physical Activity: Sufficiently Active (09/21/2022)  Social Connections: Moderately Isolated (09/21/2022)  Stress: No Stress Concern Present (09/21/2022)  Tobacco Use: High Risk (07/27/2023)     Readmission Risk Interventions     No data to display

## 2023-07-29 ENCOUNTER — Telehealth: Payer: Self-pay

## 2023-07-29 DIAGNOSIS — G9520 Unspecified cord compression: Secondary | ICD-10-CM | POA: Diagnosis not present

## 2023-07-29 DIAGNOSIS — F419 Anxiety disorder, unspecified: Secondary | ICD-10-CM | POA: Diagnosis not present

## 2023-07-29 DIAGNOSIS — M48061 Spinal stenosis, lumbar region without neurogenic claudication: Secondary | ICD-10-CM | POA: Diagnosis not present

## 2023-07-29 DIAGNOSIS — I480 Paroxysmal atrial fibrillation: Secondary | ICD-10-CM | POA: Diagnosis not present

## 2023-07-29 DIAGNOSIS — I10 Essential (primary) hypertension: Secondary | ICD-10-CM | POA: Diagnosis not present

## 2023-07-29 DIAGNOSIS — Z4789 Encounter for other orthopedic aftercare: Secondary | ICD-10-CM | POA: Diagnosis not present

## 2023-07-29 DIAGNOSIS — G8929 Other chronic pain: Secondary | ICD-10-CM | POA: Diagnosis not present

## 2023-07-29 DIAGNOSIS — M4804 Spinal stenosis, thoracic region: Secondary | ICD-10-CM | POA: Diagnosis not present

## 2023-07-29 DIAGNOSIS — F32A Depression, unspecified: Secondary | ICD-10-CM | POA: Diagnosis not present

## 2023-07-29 NOTE — Transitions of Care (Post Inpatient/ED Visit) (Signed)
   07/29/2023  Name: Cristian Gonzales MRN: [id number] DOB: [birth year]  Today's TOC FU Call Status: Today's TOC FU Call Status:: Unsuccessful Call (1st Attempt) Unsuccessful Call (1st Attempt) Date: 07/29/23  Attempted to reach the patient regarding the most recent Inpatient/ED visit.  Follow Up Plan: Additional outreach attempts will be made to reach the patient to complete the Transitions of Care (Post Inpatient/ED visit) call.     Antionette Fairy, RN,BSN,CCM RN Care Manager Transitions of Care  [place]/Population Health  Direct Phone: [phone] Toll Free: [phone] Fax: [phone]

## 2023-07-29 NOTE — Transitions of Care (Post Inpatient/ED Visit) (Signed)
07/29/2023  Name: Cristian Gonzales MRN: [id number] DOB: 1956-04-26  Today's TOC FU Call Status: Today's TOC FU Call Status:: Successful TOC FU Call Completed TOC FU Call Complete Date: 07/29/23 Patient's Name and Date of Birth confirmed.  Transition Care Management Follow-up Telephone Call Date of Discharge: 07/28/23 Discharge Facility: [facility]) Type of Discharge: Inpatient Admission Primary Inpatient Discharge Diagnosis:: "weakness" s/p T11-12 laminectomy How have you been since you were released from the hospital?: Better (pt voices he is managing okay-"not his first rodeo-has been through this before"-current pain7-9/10-just took some Tylenol prior to nurse calling, he voices HHPT just left home from working with him, he has been up walking ok, surg area looks fine) Any questions or concerns?: No  Items Reviewed: Did you receive and understand the discharge instructions provided?: Yes Medications obtained,verified, and reconciled?: Yes (Medications Reviewed) Any new allergies since your discharge?: No Dietary orders reviewed?: Yes Type of Diet Ordered:: low salt/heart healthy Do you have support at home?: Yes People in Home: spouse Name of Support/Comfort Primary Source: [place]  Medications Reviewed Today: Medications Reviewed Today     Reviewed by Charlyn Minerva, RN (Registered Nurse) on 07/29/23 at 1533  Med List Status: <None>   Medication Order Taking? Sig Documenting Provider Last Dose Status Informant  acetaminophen (TYLENOL) 325 MG tablet [id number] Yes Take 650 mg by mouth as needed for mild pain (pain score 1-3) or moderate pain (pain score 4-6). [provider] Taking Active Self, Pharmacy Records  atorvastatin (LIPITOR) 80 MG tablet [id number] No TAKE 1 TABLET EVERY DAY  Patient not taking: Reported on 07/25/2023   Shirline Frees, NP Not Taking Active Self, Pharmacy Records  buPROPion (WELLBUTRIN XL) 150 MG 24 hr tablet [id number] Yes  TAKE 1 TABLET BY MOUTH EVERY DAY Nafziger, Kandee Keen, NP Taking Active Self, Pharmacy Records  carvedilol (COREG) 3.125 MG tablet [id number] Yes TAKE 1 TABLET BY MOUTH TWICE A DAY WITH FOOD Nafziger, Kandee Keen, NP Taking Active   ibuprofen (ADVIL) 200 MG tablet [id number]  Take 400 mg by mouth as needed for mild pain (pain score 1-3) or moderate pain (pain score 4-6). [provider]  Active Self, Pharmacy Records  methocarbamol (ROBAXIN-750) 750 MG tablet [id number] Yes Take 1 tablet (750 mg total) by mouth 4 (four) times daily. Meyran, Tiana Loft, NP Taking Active   oxyCODONE-acetaminophen (PERCOCET/ROXICET) 5-325 MG tablet [id number] Yes Take 1-2 tablets by mouth every 6 (six) hours as needed for severe pain (pain score 7-10). Meyran, Tiana Loft, NP Taking Active             Home Care and Equipment/Supplies: Were Home Health Services Ordered?: Yes [facility] of [facility] Health Agency:: [facility] Has Agency set up a time to come to your home?: Yes First Home Health Visit Date: 07/29/23 (pt confirms that therapist just left the home a short while ago) Any new equipment or medical supplies ordered?: No  Functional Questionnaire: Do you need assistance with bathing/showering or dressing?: No Do you need assistance with meal preparation?: No Do you need assistance with eating?: No Do you have difficulty maintaining continence: No Do you need assistance with getting out of bed/getting out of a chair/moving?: No Do you have difficulty managing or taking your medications?: No  Follow up appointments reviewed: PCP Follow-up appointment confirmed?: NA Specialist Hospital Follow-up appointment confirmed?: Yes Follow-Up Specialty Provider:: pt confirms that wife called and made surgeon follow up appt today but he is unable to recall the date Do you need transportation to  your follow-up appointment?: No Do you understand care options if your condition(s) worsen?: Yes-patient verbalized  understanding  SDOH Interventions Today    Flowsheet Row Most Recent Value  SDOH Interventions   Food Insecurity Interventions Intervention Not Indicated  Housing Interventions Intervention Not Indicated  Utilities Interventions Intervention Not Indicated       TOC interventions discussed/reviewed: -Discussed/reviewed insurance/health plans benefits -Doctor visit discussed/reviewed -PCP -Doctor visits discussed/reviewed-Specialist -Provided Verbal Education: 30-day TOC program, nutrition, meds & their functions, sx mgmt-pain and bowel mgmt, fall/safety risk -Post discharge activity limitations per provider -Post op wound/incision care -S/S of infection   Antionette Fairy, RN,BSN,CCM RN Care Manager Transitions of Care  [place]/Population Health  Direct Phone: [phone] Toll Free: [phone] Fax: [phone]

## 2023-08-03 DIAGNOSIS — M48061 Spinal stenosis, lumbar region without neurogenic claudication: Secondary | ICD-10-CM | POA: Diagnosis not present

## 2023-08-03 DIAGNOSIS — F419 Anxiety disorder, unspecified: Secondary | ICD-10-CM | POA: Diagnosis not present

## 2023-08-03 DIAGNOSIS — G9520 Unspecified cord compression: Secondary | ICD-10-CM | POA: Diagnosis not present

## 2023-08-03 DIAGNOSIS — F32A Depression, unspecified: Secondary | ICD-10-CM | POA: Diagnosis not present

## 2023-08-03 DIAGNOSIS — M4804 Spinal stenosis, thoracic region: Secondary | ICD-10-CM | POA: Diagnosis not present

## 2023-08-03 DIAGNOSIS — I10 Essential (primary) hypertension: Secondary | ICD-10-CM | POA: Diagnosis not present

## 2023-08-03 DIAGNOSIS — G8929 Other chronic pain: Secondary | ICD-10-CM | POA: Diagnosis not present

## 2023-08-03 DIAGNOSIS — Z4789 Encounter for other orthopedic aftercare: Secondary | ICD-10-CM | POA: Diagnosis not present

## 2023-08-03 DIAGNOSIS — I480 Paroxysmal atrial fibrillation: Secondary | ICD-10-CM | POA: Diagnosis not present

## 2023-08-08 ENCOUNTER — Ambulatory Visit (INDEPENDENT_AMBULATORY_CARE_PROVIDER_SITE_OTHER): Payer: Medicare PPO

## 2023-08-08 DIAGNOSIS — F419 Anxiety disorder, unspecified: Secondary | ICD-10-CM | POA: Diagnosis not present

## 2023-08-08 DIAGNOSIS — G8929 Other chronic pain: Secondary | ICD-10-CM | POA: Diagnosis not present

## 2023-08-08 DIAGNOSIS — I63412 Cerebral infarction due to embolism of left middle cerebral artery: Secondary | ICD-10-CM | POA: Diagnosis not present

## 2023-08-08 DIAGNOSIS — Z4789 Encounter for other orthopedic aftercare: Secondary | ICD-10-CM | POA: Diagnosis not present

## 2023-08-08 DIAGNOSIS — I10 Essential (primary) hypertension: Secondary | ICD-10-CM | POA: Diagnosis not present

## 2023-08-08 DIAGNOSIS — F32A Depression, unspecified: Secondary | ICD-10-CM | POA: Diagnosis not present

## 2023-08-08 DIAGNOSIS — I480 Paroxysmal atrial fibrillation: Secondary | ICD-10-CM | POA: Diagnosis not present

## 2023-08-08 DIAGNOSIS — M4804 Spinal stenosis, thoracic region: Secondary | ICD-10-CM | POA: Diagnosis not present

## 2023-08-08 DIAGNOSIS — M48061 Spinal stenosis, lumbar region without neurogenic claudication: Secondary | ICD-10-CM | POA: Diagnosis not present

## 2023-08-08 DIAGNOSIS — G9520 Unspecified cord compression: Secondary | ICD-10-CM | POA: Diagnosis not present

## 2023-08-09 ENCOUNTER — Encounter: Payer: Self-pay | Admitting: Adult Health

## 2023-08-09 ENCOUNTER — Ambulatory Visit: Payer: Medicare PPO | Admitting: Adult Health

## 2023-08-09 VITALS — BP 130/88 | HR 76 | Temp 98.3°F | Ht 65.0 in | Wt 242.0 lb

## 2023-08-09 DIAGNOSIS — E782 Mixed hyperlipidemia: Secondary | ICD-10-CM

## 2023-08-09 DIAGNOSIS — D72829 Elevated white blood cell count, unspecified: Secondary | ICD-10-CM

## 2023-08-09 DIAGNOSIS — E669 Obesity, unspecified: Secondary | ICD-10-CM

## 2023-08-09 DIAGNOSIS — I480 Paroxysmal atrial fibrillation: Secondary | ICD-10-CM | POA: Diagnosis not present

## 2023-08-09 DIAGNOSIS — M4714 Other spondylosis with myelopathy, thoracic region: Secondary | ICD-10-CM

## 2023-08-09 DIAGNOSIS — I10 Essential (primary) hypertension: Secondary | ICD-10-CM
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
## 2023-08-10 DIAGNOSIS — I480 Paroxysmal atrial fibrillation: Secondary | ICD-10-CM | POA: Diagnosis not present

## 2023-08-10 DIAGNOSIS — G8929 Other chronic pain: Secondary | ICD-10-CM | POA: Diagnosis not present

## 2023-08-10 DIAGNOSIS — F32A Depression, unspecified: Secondary | ICD-10-CM | POA: Diagnosis not present

## 2023-08-10 DIAGNOSIS — I10 Essential (primary) hypertension: Secondary | ICD-10-CM | POA: Diagnosis not present

## 2023-08-10 DIAGNOSIS — M4804 Spinal stenosis, thoracic region: Secondary | ICD-10-CM | POA: Diagnosis not present

## 2023-08-10 DIAGNOSIS — G9520 Unspecified cord compression: Secondary | ICD-10-CM | POA: Diagnosis not present

## 2023-08-10 DIAGNOSIS — M48061 Spinal stenosis, lumbar region without neurogenic claudication: Secondary | ICD-10-CM | POA: Diagnosis not present

## 2023-08-10 DIAGNOSIS — F419 Anxiety disorder, unspecified: Secondary | ICD-10-CM | POA: Diagnosis not present

## 2023-08-10 DIAGNOSIS — Z4789 Encounter for other orthopedic aftercare: Secondary | ICD-10-CM | POA: Diagnosis not present

## 2023-08-11 LAB — CUP PACEART REMOTE DEVICE CHECK
Date Time Interrogation Session: 20241202230424
Implantable Pulse Generator Implant Date: 20220714

## 2023-08-15 DIAGNOSIS — I10 Essential (primary) hypertension: Secondary | ICD-10-CM | POA: Diagnosis not present

## 2023-08-15 DIAGNOSIS — M48061 Spinal stenosis, lumbar region without neurogenic claudication: Secondary | ICD-10-CM | POA: Diagnosis not present

## 2023-08-15 DIAGNOSIS — Z4789 Encounter for other orthopedic aftercare: Secondary | ICD-10-CM | POA: Diagnosis not present

## 2023-08-15 DIAGNOSIS — I480 Paroxysmal atrial fibrillation: Secondary | ICD-10-CM | POA: Diagnosis not present

## 2023-08-15 DIAGNOSIS — F32A Depression, unspecified: Secondary | ICD-10-CM | POA: Diagnosis not present

## 2023-08-15 DIAGNOSIS — M4804 Spinal stenosis, thoracic region: Secondary | ICD-10-CM | POA: Diagnosis not present

## 2023-08-15 DIAGNOSIS — G8929 Other chronic pain: Secondary | ICD-10-CM | POA: Diagnosis not present

## 2023-08-15 DIAGNOSIS — F419 Anxiety disorder, unspecified: Secondary | ICD-10-CM | POA: Diagnosis not present

## 2023-08-15 DIAGNOSIS — G9520 Unspecified cord compression: Secondary | ICD-10-CM | POA: Diagnosis not present

## 2023-08-17 DIAGNOSIS — M4804 Spinal stenosis, thoracic region: Secondary | ICD-10-CM | POA: Diagnosis not present

## 2023-08-17 DIAGNOSIS — I10 Essential (primary) hypertension: Secondary | ICD-10-CM | POA: Diagnosis not present

## 2023-08-17 DIAGNOSIS — F32A Depression, unspecified: Secondary | ICD-10-CM | POA: Diagnosis not present

## 2023-08-17 DIAGNOSIS — Z4789 Encounter for other orthopedic aftercare: Secondary | ICD-10-CM | POA: Diagnosis not present

## 2023-08-17 DIAGNOSIS — G9520 Unspecified cord compression: Secondary | ICD-10-CM | POA: Diagnosis not present

## 2023-08-17 DIAGNOSIS — F419 Anxiety disorder, unspecified: Secondary | ICD-10-CM | POA: Diagnosis not present

## 2023-08-17 DIAGNOSIS — M48061 Spinal stenosis, lumbar region without neurogenic claudication: Secondary | ICD-10-CM | POA: Diagnosis not present

## 2023-08-17 DIAGNOSIS — G8929 Other chronic pain: Secondary | ICD-10-CM | POA: Diagnosis not present

## 2023-08-17 DIAGNOSIS — I480 Paroxysmal atrial fibrillation: Secondary | ICD-10-CM | POA: Diagnosis not present

## 2023-08-19 NOTE — Progress Notes (Signed)
Carelink Summary Report / Loop Recorder 

## 2023-08-21 NOTE — Progress Notes (Unsigned)
Cardiology Office Note:    Date:  08/23/2023   ID:  Cristian Gonzales, DOB March 15, 1956, MRN [id number]  PCP:  Shirline Frees, NP  Cardiologist:  Meriam Sprague, MD (Inactive)  Electrophysiologist:  None   Referring MD: Shirline Frees, NP   Chief Complaint  Patient presents with   Atrial Fibrillation    History of Present Illness:    Cristian Gonzales is a 67 y.o. male with a hx of atrial fibrillation, CVA, chronic diastolic heart failure, hyperlipidemia who presents for follow-up.  Found have acute CVA 03/2021 and underwent loop recorder placement.  Presented to ED 02/2023 with chest pain, was found to be in atrial flutter with rate 130 bpm.  He converted spontaneously to sinus rhythm.  Started on Eliquis and metoprolol at that time.  He reports he had back surgery last month.  Had some lightheadedness afterwards, denies any syncope.  Denies any chest pain, dyspnea,  lower extremity edema, or palpitations.  He is doing physical therapy.  He reports has failed multiple statins due to myalagias, has been off for 2-3 months.   Past Medical History:  Diagnosis Date   Anxiety    Arthritis    Cervical spine fracture (HCC)    13 -diving accident, no neurolgic sequelae   Depression    Paroxysmal atrial fibrillation (HCC)    Stroke (HCC)    Syncope 11/27/2012   From Tramadol    Past Surgical History:  Procedure Laterality Date   BUBBLE STUDY  03/19/2021   Procedure: BUBBLE STUDY;  Surgeon: Wendall Stade, MD;  Location: [facility] ENDOSCOPY;  Service: Cardiovascular;;   CERVICAL DISCECTOMY  2004   dr Channing Mutters   KNEE SURGERY  1981   right   LOOP RECORDER INSERTION N/A 03/19/2021   Procedure: LOOP RECORDER INSERTION;  Surgeon: Lanier Prude, MD;  Location: [facility] INVASIVE CV LAB;  Service: Cardiovascular;  Laterality: N/A;   LUMBAR LAMINECTOMY/DECOMPRESSION MICRODISCECTOMY N/A 07/27/2023   Procedure: Thoracic Eleven-Twelve Decompression;  Surgeon: Donalee Citrin, MD;  Location: [facility] OR;  Service:  Neurosurgery;  Laterality: N/A;   NECK SURGERY     REPLACEMENT TOTAL KNEE     right   REPLACEMENT TOTAL KNEE     TEE WITHOUT CARDIOVERSION N/A 03/19/2021   Procedure: TRANSESOPHAGEAL ECHOCARDIOGRAM (TEE);  Surgeon: Wendall Stade, MD;  Location: [facility] ENDOSCOPY;  Service: Cardiovascular;  Laterality: N/A;    Current Medications: Current Meds  Medication Sig   apixaban (ELIQUIS) 5 MG TABS tablet    buPROPion (WELLBUTRIN XL) 150 MG 24 hr tablet TAKE 1 TABLET BY MOUTH EVERY DAY   carvedilol (COREG) 3.125 MG tablet TAKE 1 TABLET BY MOUTH TWICE A DAY WITH FOOD     Allergies:   Tramadol, Metoprolol, Morphine, and Trazodone and nefazodone   Social History   Socioeconomic History   Marital status: Married    Spouse name: Not on file   Number of children: Not on file   Years of education: Not on file   Highest education level: Not on file  Occupational History   Not on file  Tobacco Use   Smoking status: Some Days    Types: Cigars    Last attempt to quit: 01/04/2010    Years since quitting: 13.6   Smokeless tobacco: Never   Tobacco comments:    Smokes 1 cigar per day  Vaping Use   Vaping status: Never Used  Substance and Sexual Activity   Alcohol use: No   Drug use: No  Sexual activity: Not on file  Other Topics Concern   Not on file  Social History Narrative   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    Married for 25 years    5 children ( 3 in [place] and 2 in [place])   Social Drivers of Health   Financial Resource Strain: Low Risk  (09/21/2022)   Overall Financial Resource Strain (CARDIA)    Difficulty of Paying Living Expenses: Not hard at all  Food Insecurity: No Food Insecurity (07/29/2023)   Hunger Vital Sign    Worried About Running Out of Food in the Last Year: Never true    Ran Out of Food in the Last Year: Never true  Transportation Needs: No Transportation Needs (07/25/2023)   PRAPARE - Administrator, Civil Service (Medical): No     Lack of Transportation (Non-Medical): No  Physical Activity: Sufficiently Active (09/21/2022)   Exercise Vital Sign    Days of Exercise per Week: 5 days    Minutes of Exercise per Session: 30 min  Stress: No Stress Concern Present (09/21/2022)   [employer] of Occupational Health - Occupational Stress Questionnaire    Feeling of Stress : Not at all  Social Connections: Moderately Isolated (09/21/2022)   Social Connection and Isolation Panel [NHANES]    Frequency of Communication with Friends and Family: More than three times a week    Frequency of Social Gatherings with Friends and Family: More than three times a week    Attends Religious Services: Never    Database administrator or Organizations: No    Attends Engineer, structural: Never    Marital Status: Married     Family History: The patient's family history includes Heart attack (age of onset: 4) in his father; Liver cancer (age of onset: 14) in his mother.  ROS:   Please see the history of present illness.     All other systems reviewed and are negative.  EKGs/Labs/Other Studies Reviewed:    The following studies were reviewed today:   EKG:   07/24/2023: Normal sinus rhythm, rate 90, left axis deviation, Q waves in leads III, aVF and V3/4  Recent Labs: 02/06/2023: B Natriuretic Peptide 20.2; TSH 1.451 07/24/2023: Magnesium 2.2 07/26/2023: ALT 22 07/28/2023: BUN 28; Creatinine, Ser 1.44; Hemoglobin 13.1; Platelets 276; Potassium 4.4; Sodium 136  Recent Lipid Panel    Component Value Date/Time   CHOL 154 01/06/2022 1105   TRIG 92.0 01/06/2022 1105   TRIG 72 06/21/2006 1046   HDL 49.80 01/06/2022 1105   CHOLHDL 3 01/06/2022 1105   VLDL 18.4 01/06/2022 1105   LDLCALC 86 01/06/2022 1105   LDLDIRECT 161.7 10/31/2009 0926    Physical Exam:    VS:  BP 128/78   Pulse 75   Ht 5\' 5"  (1.651 m)   Wt 242 lb (109.8 kg)   SpO2 97%   BMI 40.27 kg/m     Wt Readings from Last 3 Encounters:  08/23/23 242 lb  (109.8 kg)  08/09/23 242 lb (109.8 kg)  07/27/23 250 lb (113.4 kg)     GEN:  Well nourished, well developed in no acute distress HEENT: Normal NECK: No JVD; No carotid bruits LYMPHATICS: No lymphadenopathy CARDIAC: RRR, no murmurs, rubs, gallops RESPIRATORY:  Clear to auscultation without rales, wheezing or rhonchi  ABDOMEN: Soft, non-tender, non-distended MUSCULOSKELETAL:  No edema; No deformity  SKIN: Warm and dry NEUROLOGIC:  Alert and oriented x 3 PSYCHIATRIC:  Normal  affect   ASSESSMENT:    1. PAF (paroxysmal atrial fibrillation) (HCC)   2. Cerebrovascular accident (CVA), unspecified mechanism (HCC)   3. Mixed hyperlipidemia   4. Snoring   5. Morbid obesity (HCC)   6. AKI (acute kidney injury) (HCC)    PLAN:    Paroxysmal atrial fibrillation: Diagnosed on ED visit 02/2023.  CHA2DS2-VASc 3 (age, CVA x 2).  Echocardiogram 02/07/2023 showed EF 60 to 65%, normal RV function, no significant valvular disease. -Continue Eliquis 5 mg twice daily -Continue carvedilol 3.125 mg twice daily -Check sleep study  CVA: Admitted 03/2021 with acute CVA.  TEE 03/2021 showed EF 55%, small PFO.  Has a loop recorder.  Found to have atrial fibrillation/flutter as above.  On Eliquis.  Has been unable to tolerate statins, referred to pharmacy lipid clinic to evaluate for PCSK9 inhibitor  Hyperlipidemia: Reports intolerance to multiple statins due to myalgias.  Currently off statin.  Update lipid panel.  Referred to pharmacy lipid clinic to consider PCSK9 inhibitor  Morbid obesity: Body mass index is 40.27 kg/m.  Diet/exercise encouraged.  Discussed referral to healthy weight and wellness but he declines at this time  Snoring: Check [facility] sleep study.  STOP-BANG 5  AKI: Creatinine 1.44 on 07/27/2020 after spinal surgery, baseline appears 1-1.2.  Will check BMET   RTC in 6 months  Medication Adjustments/Labs and Tests Ordered: Current medicines are reviewed at length with the patient today.   Concerns regarding medicines are outlined above.  Orders Placed This Encounter  Procedures   Lipid panel   Basic Metabolic Panel (BMET)   AMB Referral to [facility] Pharm-D   [facility] Sleep Study   No orders of the defined types were placed in this encounter.   Patient Instructions  Medication Instructions:  Continue all current medications *If you need a refill on your cardiac medications before your next appointment, please call your pharmacy*   Lab Work: Bmet, Lipid Today If you have labs (blood work) drawn today and your tests are completely normal, you will receive your results only by: MyChart Message (if you have MyChart) OR A paper copy in the mail If you have any lab test that is abnormal or we need to change your treatment, we will call you to review the results.   Testing/Procedures: WatchPAT?  Is a FDA cleared portable home sleep study test that uses a watch and 3 points of contact to monitor 7 different channels, including your heart rate, oxygen saturations, body position, snoring, and chest motion.  The study is easy to use from the comfort of your own home and accurately detect sleep apnea.  Before bed, you attach the chest sensor, attached the sleep apnea bracelet to your nondominant hand, and attach the finger probe.  After the study, the raw data is downloaded from the watch and scored for apnea events.   For more information: [email or web address]  Patient Testing Instructions:  Do not put battery into the device until bedtime when you are ready to begin the test. Please call the support number if you need assistance after following the instructions below: 24 hour support line- [phone] or ITAMAR support at [phone] (option 2)  Download the IntelWatchPAT One" app through the google play store or App Store  Be sure to turn on or enable access to bluetooth in settlings on your smartphone/ device  Make sure no other bluetooth devices are  on and within the vicinity of your smartphone/ device and WatchPAT watch during testing.  Make sure  to leave your smart phone/ device plugged in and charging all night.  When ready for bed:  Follow the instructions step by step in the WatchPAT One App to activate the testing device. For additional instructions, including video instruction, visit the WatchPAT One video on Youtube. You can search for WatchPat One within Youtube (video is 4 minutes and 18 seconds) or enter: https://youtube/watch?[email or web address]_vbiwxE Please note: You will be prompted to enter a Pin to connect via bluetooth when starting the test. The PIN will be assigned to you when you receive the test.  The device is disposable, but it recommended that you retain the device until you receive a call letting you know the study has been received and the results have been interpreted.  We will let you know if the study did not transmit to [place] properly after the test is completed. You do not need to call us to confirm the receipt of the test.  Please complete the test within 48 hours of receiving PIN.   Frequently Asked Questions:  What is Watch Dennie Bible one?  A single use fully disposable home sleep apnea testing device and will not need to be returned after completion.  What are the requirements to use WatchPAT one?  The be able to have a successful watchpat one sleep study, you should have your Watch pat one device, your smart phone, watch pat one app, your PIN number and Internet access What type of phone do I need?  You should have a smart phone that uses Android 5.1 and above or any Iphone with IOS 10 and above How can I download the WatchPAT one app?  Based on your device type search for WatchPAT one app either in google play for android devices or APP store for Iphone's Where will I get my PIN for the study?  Your PIN will be provided by your physician's office. It is used for authentication and if you lose/forget your PIN, please reach out  to your providers office.  I do not have Internet at home. Can I do WatchPAT one study?  WatchPAT One needs Internet connection throughout the night to be able to transmit the sleep data. You can use your home/local internet or your cellular's data package. However, it is always recommended to use home/local Internet. It is estimated that between 20MB-30MB will be used with each study.However, the application will be looking for space in the phone to start the study.  What happens if I lose internet or bluetooth connection?  During the internet disconnection, your phone will not be able to transmit the sleep data. All the data, will be stored in your phone. As soon as the internet connection is back on, the phone will being sending the sleep data. During the bluetooth disconnection, WatchPAT one will not be able to to send the sleep data to your phone. Data will be kept in the [facility] one until two devices have bluetooth connection back on. As soon as the connection is back on, WatchPAT one will send the sleep data to the phone.  How long do I need to wear the WatchPAT one?  After you start the study, you should wear the device at least 6 hours.  How far should I keep my phone from the device?  During the night, your phone should be within 15 feet.  What happens if I leave the room for restroom or other reasons?  Leaving the room for any reason will not cause any problem. As soon  as your get back to the room, both devices will reconnect and will continue to send the sleep data. Can I use my phone during the sleep study?  Yes, you can use your phone as usual during the study. But it is recommended to put your watchpat one on when you are ready to go to bed.  How will I get my study results?  A soon as you completed your study, your sleep data will be sent to the provider. They will then share the results with you when they are ready.      Follow-Up: At [facility], you and your  health needs are our priority.  As part of our continuing mission to provide you with exceptional heart care, we have created designated Provider Care Teams.  These Care Teams include your primary Cardiologist (physician) and Advanced Practice Providers (APPs -  Physician Assistants and Nurse Practitioners) who all work together to provide you with the care you need, when you need it.  We recommend signing up for the patient portal called "MyChart".  Sign up information is provided on this After Visit Summary.  MyChart is used to connect with patients for Virtual Visits (Telemedicine).  Patients are able to view lab/test results, encounter notes, upcoming appointments, etc.  Non-urgent messages can be sent to your provider as well.   To learn more about what you can do with MyChart, go to [email or web address].    Your next appointment:   6 month(s)  Provider:   Dr. Bjorn Pippin  Other Instructions Referral to Pharm D Hyperlipidemia        Signed, Little Ishikawa, MD  08/23/2023 12:35 PM    [facility]

## 2023-08-22 DIAGNOSIS — Z4789 Encounter for other orthopedic aftercare: Secondary | ICD-10-CM | POA: Diagnosis not present

## 2023-08-22 DIAGNOSIS — F419 Anxiety disorder, unspecified: Secondary | ICD-10-CM | POA: Diagnosis not present

## 2023-08-22 DIAGNOSIS — F32A Depression, unspecified: Secondary | ICD-10-CM | POA: Diagnosis not present

## 2023-08-22 DIAGNOSIS — M48061 Spinal stenosis, lumbar region without neurogenic claudication: Secondary | ICD-10-CM | POA: Diagnosis not present

## 2023-08-22 DIAGNOSIS — I480 Paroxysmal atrial fibrillation: Secondary | ICD-10-CM | POA: Diagnosis not present

## 2023-08-22 DIAGNOSIS — M4804 Spinal stenosis, thoracic region: Secondary | ICD-10-CM | POA: Diagnosis not present

## 2023-08-22 DIAGNOSIS — I10 Essential (primary) hypertension: Secondary | ICD-10-CM | POA: Diagnosis not present

## 2023-08-22 DIAGNOSIS — G8929 Other chronic pain: Secondary | ICD-10-CM | POA: Diagnosis not present

## 2023-08-22 DIAGNOSIS — G9520 Unspecified cord compression: Secondary | ICD-10-CM | POA: Diagnosis not present

## 2023-08-23 ENCOUNTER — Encounter: Payer: Self-pay | Admitting: Cardiology

## 2023-08-23 ENCOUNTER — Ambulatory Visit: Payer: Medicare PPO | Attending: Cardiology | Admitting: Cardiology

## 2023-08-23 VITALS — BP 128/78 | HR 75 | Ht 65.0 in | Wt 242.0 lb

## 2023-08-23 DIAGNOSIS — R0683 Snoring: Secondary | ICD-10-CM | POA: Diagnosis not present

## 2023-08-23 DIAGNOSIS — I639 Cerebral infarction, unspecified: Secondary | ICD-10-CM

## 2023-08-23 DIAGNOSIS — E782 Mixed hyperlipidemia: Secondary | ICD-10-CM | POA: Diagnosis not present

## 2023-08-23 DIAGNOSIS — I480 Paroxysmal atrial fibrillation: Secondary | ICD-10-CM

## 2023-08-23 DIAGNOSIS — I5032 Chronic diastolic (congestive) heart failure: Secondary | ICD-10-CM | POA: Diagnosis not present

## 2023-08-23 DIAGNOSIS — N179 Acute kidney failure, unspecified: Secondary | ICD-10-CM

## 2023-08-23 LAB — BASIC METABOLIC PANEL
BUN/Creatinine Ratio: 19 (ref 10–24)
BUN: 19 mg/dL (ref 8–27)
CO2: 24 mmol/L (ref 20–29)
Calcium: 9.6 mg/dL (ref 8.6–10.2)
Chloride: 101 mmol/L (ref 96–106)
Creatinine, Ser: 1.01 mg/dL (ref 0.76–1.27)
Glucose: 90 mg/dL (ref 70–99)
Potassium: 4.8 mmol/L (ref 3.5–5.2)
Sodium: 139 mmol/L (ref 134–144)
eGFR: 82 mL/min/{1.73_m2} (ref >59)

## 2023-08-23 LAB — LIPID PANEL
Chol/HDL Ratio: 5.7 {ratio} — ABNORMAL HIGH (ref 0.0–5.0)
Cholesterol, Total: 244 mg/dL — ABNORMAL HIGH (ref 100–199)
HDL: 43 mg/dL (ref >39)
LDL Chol Calc (NIH): 178 mg/dL — ABNORMAL HIGH (ref 0–99)
Triglycerides: 127 mg/dL (ref 0–149)
VLDL Cholesterol Cal: 23 mg/dL (ref 5–40)

## 2023-08-23 NOTE — Patient Instructions (Signed)
Medication Instructions:  Continue all current medications *If you need a refill on your cardiac medications before your next appointment, please call your pharmacy*   Lab Work: Bmet, Lipid Today If you have labs (blood work) drawn today and your tests are completely normal, you will receive your results only by: MyChart Message (if you have MyChart) OR A paper copy in the mail If you have any lab test that is abnormal or we need to change your treatment, we will call you to review the results.   Testing/Procedures: WatchPAT?  Is a FDA cleared portable home sleep study test that uses a watch and 3 points of contact to monitor 7 different channels, including your heart rate, oxygen saturations, body position, snoring, and chest motion.  The study is easy to use from the comfort of your own home and accurately detect sleep apnea.  Before bed, you attach the chest sensor, attached the sleep apnea bracelet to your nondominant hand, and attach the finger probe.  After the study, the raw data is downloaded from the watch and scored for apnea events.   For more information: [email or web address]  Patient Testing Instructions:  Do not put battery into the device until bedtime when you are ready to begin the test. Please call the support number if you need assistance after following the instructions below: 24 hour support line- [phone] or ITAMAR support at [phone] (option 2)  Download the IntelWatchPAT One" app through the google play store or App Store  Be sure to turn on or enable access to bluetooth in settlings on your smartphone/ device  Make sure no other bluetooth devices are on and within the vicinity of your smartphone/ device and WatchPAT watch during testing.  Make sure to leave your smart phone/ device plugged in and charging all night.  When ready for bed:  Follow the instructions step by step in the WatchPAT One App to activate the testing device. For  additional instructions, including video instruction, visit the WatchPAT One video on Youtube. You can search for WatchPat One within Youtube (video is 4 minutes and 18 seconds) or enter: https://youtube/watch?[email or web address]_vbiwxE Please note: You will be prompted to enter a Pin to connect via bluetooth when starting the test. The PIN will be assigned to you when you receive the test.  The device is disposable, but it recommended that you retain the device until you receive a call letting you know the study has been received and the results have been interpreted.  We will let you know if the study did not transmit to [place] properly after the test is completed. You do not need to call us to confirm the receipt of the test.  Please complete the test within 48 hours of receiving PIN.   Frequently Asked Questions:  What is Watch Dennie Bible one?  A single use fully disposable home sleep apnea testing device and will not need to be returned after completion.  What are the requirements to use WatchPAT one?  The be able to have a successful watchpat one sleep study, you should have your Watch pat one device, your smart phone, watch pat one app, your PIN number and Internet access What type of phone do I need?  You should have a smart phone that uses Android 5.1 and above or any Iphone with IOS 10 and above How can I download the WatchPAT one app?  Based on your device type search for WatchPAT one app either in google play for android devices or APP  store for Iphone's Where will I get my PIN for the study?  Your PIN will be provided by your physician's office. It is used for authentication and if you lose/forget your PIN, please reach out to your providers office.  I do not have Internet at home. Can I do WatchPAT one study?  WatchPAT One needs Internet connection throughout the night to be able to transmit the sleep data. You can use your home/local internet or your cellular's data package. However, it is always  recommended to use home/local Internet. It is estimated that between 20MB-30MB will be used with each study.However, the application will be looking for space in the phone to start the study.  What happens if I lose internet or bluetooth connection?  During the internet disconnection, your phone will not be able to transmit the sleep data. All the data, will be stored in your phone. As soon as the internet connection is back on, the phone will being sending the sleep data. During the bluetooth disconnection, WatchPAT one will not be able to to send the sleep data to your phone. Data will be kept in the [facility] one until two devices have bluetooth connection back on. As soon as the connection is back on, WatchPAT one will send the sleep data to the phone.  How long do I need to wear the WatchPAT one?  After you start the study, you should wear the device at least 6 hours.  How far should I keep my phone from the device?  During the night, your phone should be within 15 feet.  What happens if I leave the room for restroom or other reasons?  Leaving the room for any reason will not cause any problem. As soon as your get back to the room, both devices will reconnect and will continue to send the sleep data. Can I use my phone during the sleep study?  Yes, you can use your phone as usual during the study. But it is recommended to put your watchpat one on when you are ready to go to bed.  How will I get my study results?  A soon as you completed your study, your sleep data will be sent to the provider. They will then share the results with you when they are ready.      Follow-Up: At [facility], you and your health needs are our priority.  As part of our continuing mission to provide you with exceptional heart care, we have created designated Provider Care Teams.  These Care Teams include your primary Cardiologist (physician) and Advanced Practice Providers (APPs -  Physician Assistants  and Nurse Practitioners) who all work together to provide you with the care you need, when you need it.  We recommend signing up for the patient portal called "MyChart".  Sign up information is provided on this After Visit Summary.  MyChart is used to connect with patients for Virtual Visits (Telemedicine).  Patients are able to view lab/test results, encounter notes, upcoming appointments, etc.  Non-urgent messages can be sent to your provider as well.   To learn more about what you can do with MyChart, go to [email or web address].    Your next appointment:   6 month(s)  Provider:   Dr. Bjorn Pippin  Other Instructions Referral to Pharm D Hyperlipidemia

## 2023-08-24 ENCOUNTER — Telehealth: Payer: Self-pay | Admitting: Cardiology

## 2023-08-24 NOTE — Telephone Encounter (Signed)
Patient called the answering service this evening concerned that his HR was a bit irregular. Reports that ever since he had his stroke in 2022, he has had a habit of feeling the pulse on his wrist. Today, he has noticed that every 50-70 beats or so, his heart will skip a beat. He does not have any symptoms with this.   Discussed that it is likely he is having PACs or PVCs, which are not dangerous. He reports compliance with his eliquis and carvedilol. Discussed that if his occasional skipped beats are due to atrial fibrillation or flutter, he is on good medications and his eliquis is helping to prevent strokes. As he is asymptomatic, we can continue to monitor his symptoms for now.   Patient voiced understanding. I will forward to Dr. Bjorn Pippin as an Olegario Shearer, PA-C 08/24/2023 7:13 PM

## 2023-08-25 DIAGNOSIS — I480 Paroxysmal atrial fibrillation: Secondary | ICD-10-CM | POA: Diagnosis not present

## 2023-08-25 DIAGNOSIS — G8929 Other chronic pain: Secondary | ICD-10-CM | POA: Diagnosis not present

## 2023-08-25 DIAGNOSIS — F32A Depression, unspecified: Secondary | ICD-10-CM | POA: Diagnosis not present

## 2023-08-25 DIAGNOSIS — F419 Anxiety disorder, unspecified: Secondary | ICD-10-CM | POA: Diagnosis not present

## 2023-08-25 DIAGNOSIS — I10 Essential (primary) hypertension: Secondary | ICD-10-CM | POA: Diagnosis not present

## 2023-08-25 DIAGNOSIS — Z1212 Encounter for screening for malignant neoplasm of rectum: Secondary | ICD-10-CM | POA: Diagnosis not present

## 2023-08-25 DIAGNOSIS — Z1211 Encounter for screening for malignant neoplasm of colon: Secondary | ICD-10-CM | POA: Diagnosis not present

## 2023-08-25 DIAGNOSIS — G9520 Unspecified cord compression: Secondary | ICD-10-CM | POA: Diagnosis not present

## 2023-08-25 DIAGNOSIS — Z4789 Encounter for other orthopedic aftercare: Secondary | ICD-10-CM | POA: Diagnosis not present

## 2023-08-25 DIAGNOSIS — M4804 Spinal stenosis, thoracic region: Secondary | ICD-10-CM | POA: Diagnosis not present

## 2023-08-25 DIAGNOSIS — M48061 Spinal stenosis, lumbar region without neurogenic claudication: Secondary | ICD-10-CM | POA: Diagnosis not present

## 2023-08-29 ENCOUNTER — Telehealth: Payer: Self-pay | Admitting: *Deleted

## 2023-08-29 DIAGNOSIS — E782 Mixed hyperlipidemia: Secondary | ICD-10-CM

## 2023-08-29 NOTE — Telephone Encounter (Signed)
Called patient to give  Dr. Bjorn Pippin recommendations. Unable to leave VM.

## 2023-08-29 NOTE — Telephone Encounter (Deleted)
-----   Message from Little Ishikawa sent at 08/24/2023  8:47 AM EST ----- Kidney function has improved to normal.  Cholesterol significantly elevated but he reports has not been able to tolerate statin.  Continue with plan to refer to pharmacy lipid clinic for PCSK9 inhibitor

## 2023-08-29 NOTE — Telephone Encounter (Signed)
Spoke to patient and Per Dr. Bjorn Pippin kidney function has improved to normal.  Cholesterol significantly elevated but he reports has not been able to tolerate statin.  Continue with plan to refer to pharmacy lipid clinic for PCSK9 inhibitor. Referral made and patient voiced an understanding

## 2023-08-30 ENCOUNTER — Other Ambulatory Visit: Payer: Self-pay | Admitting: Adult Health

## 2023-08-30 DIAGNOSIS — M48061 Spinal stenosis, lumbar region without neurogenic claudication: Secondary | ICD-10-CM | POA: Diagnosis not present

## 2023-08-30 DIAGNOSIS — F39 Unspecified mood [affective] disorder: Secondary | ICD-10-CM

## 2023-08-30 DIAGNOSIS — I10 Essential (primary) hypertension: Secondary | ICD-10-CM | POA: Diagnosis not present

## 2023-08-30 DIAGNOSIS — I480 Paroxysmal atrial fibrillation: Secondary | ICD-10-CM | POA: Diagnosis not present

## 2023-08-30 DIAGNOSIS — G8929 Other chronic pain: Secondary | ICD-10-CM | POA: Diagnosis not present

## 2023-08-30 DIAGNOSIS — Z4789 Encounter for other orthopedic aftercare: Secondary | ICD-10-CM | POA: Diagnosis not present

## 2023-08-30 DIAGNOSIS — G9520 Unspecified cord compression: Secondary | ICD-10-CM | POA: Diagnosis not present

## 2023-08-30 DIAGNOSIS — M4804 Spinal stenosis, thoracic region: Secondary | ICD-10-CM | POA: Diagnosis not present

## 2023-08-30 DIAGNOSIS — F32A Depression, unspecified: Secondary | ICD-10-CM | POA: Diagnosis not present

## 2023-08-30 DIAGNOSIS — F419 Anxiety disorder, unspecified: Secondary | ICD-10-CM | POA: Diagnosis not present

## 2023-09-05 DIAGNOSIS — F32A Depression, unspecified: Secondary | ICD-10-CM | POA: Diagnosis not present

## 2023-09-05 DIAGNOSIS — I480 Paroxysmal atrial fibrillation: Secondary | ICD-10-CM | POA: Diagnosis not present

## 2023-09-05 DIAGNOSIS — Z4789 Encounter for other orthopedic aftercare: Secondary | ICD-10-CM | POA: Diagnosis not present

## 2023-09-05 DIAGNOSIS — I10 Essential (primary) hypertension: Secondary | ICD-10-CM | POA: Diagnosis not present

## 2023-09-05 DIAGNOSIS — M48061 Spinal stenosis, lumbar region without neurogenic claudication: Secondary | ICD-10-CM | POA: Diagnosis not present

## 2023-09-05 DIAGNOSIS — M4804 Spinal stenosis, thoracic region: Secondary | ICD-10-CM | POA: Diagnosis not present

## 2023-09-05 DIAGNOSIS — G8929 Other chronic pain: Secondary | ICD-10-CM | POA: Diagnosis not present

## 2023-09-05 DIAGNOSIS — F419 Anxiety disorder, unspecified: Secondary | ICD-10-CM | POA: Diagnosis not present

## 2023-09-05 DIAGNOSIS — G9520 Unspecified cord compression: Secondary | ICD-10-CM | POA: Diagnosis not present

## 2023-09-09 LAB — COLOGUARD: COLOGUARD: NEGATIVE

## 2023-09-14 DIAGNOSIS — M4714 Other spondylosis with myelopathy, thoracic region: Secondary | ICD-10-CM | POA: Diagnosis not present

## 2023-09-14 DIAGNOSIS — M25562 Pain in left knee: Secondary | ICD-10-CM | POA: Diagnosis not present

## 2023-09-19 DIAGNOSIS — M4714 Other spondylosis with myelopathy, thoracic region: Secondary | ICD-10-CM | POA: Diagnosis not present

## 2023-09-19 DIAGNOSIS — M25562 Pain in left knee: Secondary | ICD-10-CM | POA: Diagnosis not present

## 2023-09-26 DIAGNOSIS — M25562 Pain in left knee: Secondary | ICD-10-CM | POA: Diagnosis not present

## 2023-09-26 DIAGNOSIS — M4714 Other spondylosis with myelopathy, thoracic region: Secondary | ICD-10-CM | POA: Diagnosis not present

## 2023-09-30 DIAGNOSIS — M4714 Other spondylosis with myelopathy, thoracic region: Secondary | ICD-10-CM | POA: Diagnosis not present

## 2023-09-30 DIAGNOSIS — M25562 Pain in left knee: Secondary | ICD-10-CM | POA: Diagnosis not present

## 2023-10-03 ENCOUNTER — Other Ambulatory Visit: Payer: Self-pay | Admitting: Adult Health

## 2023-10-03 ENCOUNTER — Ambulatory Visit (INDEPENDENT_AMBULATORY_CARE_PROVIDER_SITE_OTHER): Payer: Medicare PPO

## 2023-10-03 VITALS — BP 120/60 | HR 70 | Temp 98.1°F | Ht 65.0 in | Wt 244.4 lb

## 2023-10-03 DIAGNOSIS — Z23 Encounter for immunization: Secondary | ICD-10-CM

## 2023-10-03 DIAGNOSIS — I4891 Unspecified atrial fibrillation: Secondary | ICD-10-CM

## 2023-10-03 DIAGNOSIS — Z0000 Encounter for general adult medical examination without abnormal findings: Secondary | ICD-10-CM | POA: Diagnosis not present

## 2023-10-03 NOTE — Progress Notes (Signed)
Subjective:   Cristian Gonzales is a 68 y.o. male who presents for Medicare Annual/Subsequent preventive examination.  Visit Complete: In person      Objective:    Today's Vitals   10/03/23 1406  BP: 120/60  Pulse: 70  Temp: 98.1 F (36.7 C)  TempSrc: Oral  SpO2: 96%  Weight: 244 lb 6.4 oz (110.9 kg)  Height: 5\' 5"  (1.651 m)   Body mass index is 40.67 kg/m.     10/03/2023    2:36 PM 07/27/2023    2:41 PM 07/25/2023   12:16 PM 02/18/2023    1:15 AM 02/07/2023    4:14 AM 02/06/2023    7:44 PM 09/21/2022    9:05 AM  Advanced Directives  Does Patient Have a Medical Advance Directive? No No No No  No No  Would patient like information on creating a medical advance directive? No - Patient declined No - Patient declined No - Patient declined  No - Patient declined  No - Patient declined    Current Medications (verified) Outpatient Encounter Medications as of 10/03/2023  Medication Sig   acetaminophen (TYLENOL) 325 MG tablet Take 650 mg by mouth as needed for mild pain (pain score 1-3) or moderate pain (pain score 4-6). (Patient not taking: Reported on 08/23/2023)   apixaban (ELIQUIS) 5 MG TABS tablet    atorvastatin (LIPITOR) 80 MG tablet TAKE 1 TABLET EVERY DAY (Patient not taking: Reported on 08/23/2023)   buPROPion (WELLBUTRIN XL) 150 MG 24 hr tablet TAKE 1 TABLET BY MOUTH EVERY DAY   carvedilol (COREG) 3.125 MG tablet TAKE 1 TABLET BY MOUTH TWICE A DAY WITH FOOD   ibuprofen (ADVIL) 200 MG tablet Take 400 mg by mouth as needed for mild pain (pain score 1-3) or moderate pain (pain score 4-6). (Patient not taking: Reported on 08/23/2023)   No facility-administered encounter medications on file as of 10/03/2023.    Allergies (verified) Tramadol, Metoprolol, Morphine, and Trazodone and nefazodone   History: Past Medical History:  Diagnosis Date   Anxiety    Arthritis    Cervical spine fracture (HCC)    13 -diving accident, no neurolgic sequelae   Depression     Paroxysmal atrial fibrillation (HCC)    Stroke (HCC)    Syncope 11/27/2012   From Tramadol   Past Surgical History:  Procedure Laterality Date   BUBBLE STUDY  03/19/2021   Procedure: BUBBLE STUDY;  Surgeon: Wendall Stade, MD;  Location: [facility] ENDOSCOPY;  Service: Cardiovascular;;   CERVICAL DISCECTOMY  2004   dr Channing Mutters   KNEE SURGERY  1981   right   LOOP RECORDER INSERTION N/A 03/19/2021   Procedure: LOOP RECORDER INSERTION;  Surgeon: Lanier Prude, MD;  Location: [facility] INVASIVE CV LAB;  Service: Cardiovascular;  Laterality: N/A;   LUMBAR LAMINECTOMY/DECOMPRESSION MICRODISCECTOMY N/A 07/27/2023   Procedure: Thoracic Eleven-Twelve Decompression;  Surgeon: Donalee Citrin, MD;  Location: [facility] OR;  Service: Neurosurgery;  Laterality: N/A;   NECK SURGERY     REPLACEMENT TOTAL KNEE     right   REPLACEMENT TOTAL KNEE     TEE WITHOUT CARDIOVERSION N/A 03/19/2021   Procedure: TRANSESOPHAGEAL ECHOCARDIOGRAM (TEE);  Surgeon: Wendall Stade, MD;  Location: [facility] ENDOSCOPY;  Service: Cardiovascular;  Laterality: N/A;   Family History  Problem Relation Age of Onset   Heart attack Father 28       Sudden Cardiac Death   Liver cancer Mother 40   Social History   Socioeconomic History   Marital  status: Married    Spouse name: Not on file   Number of children: Not on file   Years of education: Not on file   Highest education level: Not on file  Occupational History   Not on file  Tobacco Use   Smoking status: Some Days    Types: Cigars    Last attempt to quit: 01/04/2010    Years since quitting: 13.7   Smokeless tobacco: Never   Tobacco comments:    Smokes 1 cigar per day  Vaping Use   Vaping status: Never Used  Substance and Sexual Activity   Alcohol use: No   Drug use: No   Sexual activity: Not on file  Other Topics Concern   Not on file  Social History Narrative   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    Married for 25 years    5 children ( 3 in [place] and 2 in [place])    Social Drivers of Health   Financial Resource Strain: Low Risk  (10/03/2023)   Overall Financial Resource Strain (CARDIA)    Difficulty of Paying Living Expenses: Not hard at all  Food Insecurity: No Food Insecurity (10/03/2023)   Hunger Vital Sign    Worried About Running Out of Food in the Last Year: Never true    Ran Out of Food in the Last Year: Never true  Transportation Needs: No Transportation Needs (10/03/2023)   PRAPARE - Administrator, Civil Service (Medical): No    Lack of Transportation (Non-Medical): No  Physical Activity: Sufficiently Active (10/03/2023)   Exercise Vital Sign    Days of Exercise per Week: 5 days    Minutes of Exercise per Session: 30 min  Stress: No Stress Concern Present (10/03/2023)   [employer] of Occupational Health - Occupational Stress Questionnaire    Feeling of Stress : Not at all  Social Connections: Moderately Integrated (10/03/2023)   Social Connection and Isolation Panel [NHANES]    Frequency of Communication with Friends and Family: More than three times a week    Frequency of Social Gatherings with Friends and Family: More than three times a week    Attends Religious Services: Never    Database administrator or Organizations: Yes    Attends Engineer, structural: More than 4 times per year    Marital Status: Married    Tobacco Counseling Ready to quit: No Counseling given: Yes Tobacco comments: Smokes 1 cigar per day   Clinical Intake:  Pre-visit preparation completed: Yes  Pain : No/denies pain     BMI - recorded: 40.67 Nutritional Status: BMI > 30  Obese Nutritional Risks: None Diabetes: No  How often do you need to have someone help you when you read instructions, pamphlets, or other written materials from your doctor or pharmacy?: 1 - Never  Interpreter Needed?: No  Information entered by :: Theresa Mulligan LPN   Activities of Daily Living    10/03/2023    2:34 PM 07/25/2023   12:16 PM   In your present state of health, do you have any difficulty performing the following activities:  Hearing? 0 0  Vision? 0 1  Difficulty concentrating or making decisions? 0 0  Walking or climbing stairs? 1   Comment Uses a Cane   Dressing or bathing? 0   Doing errands, shopping? 0 0  Preparing Food and eating ? N   Using the Toilet? N   In the past six  months, have you accidently leaked urine? N   Do you have problems with loss of bowel control? N   Managing your Medications? N   Managing your Finances? N   Housekeeping or managing your Housekeeping? N     Patient Care Team: Shirline Frees, NP as PCP - General (Family Medicine) Meriam Sprague, MD (Inactive) as PCP - Cardiology (Cardiology)  Indicate any recent Medical Services you may have received from other than [facility] providers in the past year (date may be approximate).     Assessment:   This is a routine wellness examination for Lola.  Hearing/Vision screen Hearing Screening - Comments:: Denies hearing difficulties   Vision Screening - Comments:: Wears rx glasses - up to date with routine eye exams with  Deferred   Goals Addressed               This Visit's Progress     Increase physical activity (pt-stated)        Stay Active!       Depression Screen    10/03/2023    2:13 PM 08/09/2023    7:27 AM 12/08/2022    4:19 PM 09/21/2022    8:44 AM 01/06/2022   11:13 AM 08/19/2021    7:43 AM 03/31/2021    7:36 AM  PHQ 2/9 Scores  PHQ - 2 Score 0 0 5 0 3 6 0  PHQ- 9 Score 0 4 12  12 14      Fall Risk    10/03/2023    2:35 PM 08/09/2023    7:27 AM 09/21/2022    9:05 AM  Fall Risk   Falls in the past year? 1 1 0  Number falls in past yr: 0 1 0  Injury with Fall? 0 0 0  Risk for fall due to : No Fall Risks  No Fall Risks  Follow up Falls prevention discussed Falls evaluation completed Falls prevention discussed    MEDICARE RISK AT HOME: Medicare Risk at Home Any stairs in or around the home?: No If  so, are there any without handrails?: No Home free of loose throw rugs in walkways, pet beds, electrical cords, etc?: Yes Adequate lighting in your home to reduce risk of falls?: Yes Life alert?: No Use of a cane, walker or w/c?: Yes Grab bars in the bathroom?: No Shower chair or bench in shower?: No Elevated toilet seat or a handicapped toilet?: No  TIMED UP AND GO:  Was the test performed?  Yes  Length of time to ambulate 10 feet: 10 sec Gait slow and steady with assistive device    Cognitive Function:        10/03/2023    2:36 PM 09/21/2022    9:06 AM  6CIT Screen  What Year? 0 points 0 points  What month? 0 points 0 points  What time? 0 points 0 points  Count back from 20 2 points 0 points  Months in reverse 2 points 0 points  Repeat phrase 0 points 0 points  Total Score 4 points 0 points    Immunizations Immunization History  Administered Date(s) Administered   Fluad Quad(high Dose 65+) 09/21/2022   Fluad Trivalent(High Dose 65+) 10/03/2023   Influenza Inj Mdck Quad Pf 09/28/2018   Influenza Split 06/30/2011   Influenza Whole 08/22/2012   Influenza,inj,Quad PF,6+ Mos 06/25/2013, 06/05/2014, 06/17/2015, 10/08/2016, 11/01/2017, 06/08/2019   PFIZER(Purple Top)SARS-COV-2 Vaccination 12/06/2019, 12/31/2019   Td 09/06/2002   Tdap 06/17/2015    TDAP status: Up to date  Flu Vaccine status: Completed at today's visit  Pneumococcal vaccine status: Due, Education has been provided regarding the importance of this vaccine. Advised may receive this vaccine at local pharmacy or Health Dept. Aware to provide a copy of the vaccination record if obtained from local pharmacy or Health Dept. Verbalized acceptance and understanding.  Covid-19 vaccine status: Declined, Education has been provided regarding the importance of this vaccine but patient still declined. Advised may receive this vaccine at local pharmacy or Health Dept.or vaccine clinic. Aware to provide a copy of the  vaccination record if obtained from local pharmacy or Health Dept. Verbalized acceptance and understanding.  Qualifies for Shingles Vaccine? Yes   Zostavax completed No   Shingrix Completed?: No.    Education has been provided regarding the importance of this vaccine. Patient has been advised to call insurance company to determine out of pocket expense if they have not yet received this vaccine. Advised may also receive vaccine at local pharmacy or Health Dept. Verbalized acceptance and understanding.  Screening Tests Health Maintenance  Topic Date Due   Pneumonia Vaccine 57+ Years old (1 of 2 - PCV) Never done   Zoster Vaccines- Shingrix (1 of 2) Never done   COVID-19 Vaccine (3 - 2024-25 season) 05/08/2023   Medicare Annual Wellness (AWV)  10/02/2024   DTaP/Tdap/Td (3 - Td or Tdap) 06/16/2025   Fecal DNA (Cologuard)  08/24/2026   INFLUENZA VACCINE  Completed   Hepatitis C Screening  Completed   HPV VACCINES  Aged Out    Health Maintenance  Health Maintenance Due  Topic Date Due   Pneumonia Vaccine 26+ Years old (1 of 2 - PCV) Never done   Zoster Vaccines- Shingrix (1 of 2) Never done   COVID-19 Vaccine (3 - 2024-25 season) 05/08/2023    Colorectal cancer screening: Type of screening: Cologuard. Completed 08/25/23. Repeat every 3 years    Additional Screening:  Hepatitis C Screening: does qualify; Completed 11/01/17  Vision Screening: Recommended annual ophthalmology exams for early detection of glaucoma and other disorders of the eye. Is the patient up to date with their annual eye exam?  Yes  Who is the provider or what is the name of the office in which the patient attends annual eye exams? Deferred If pt is not established with a provider, would they like to be referred to a provider to establish care? No .   Dental Screening: Recommended annual dental exams for proper oral hygiene    Community Resource Referral / Chronic Care Management:  CRR required this visit?   No   CCM required this visit?  No     Plan:     I have personally reviewed and noted the following in the patient's chart:   Medical and social history Use of alcohol, tobacco or illicit drugs  Current medications and supplements including opioid prescriptions. Patient is not currently taking opioid prescriptions. Functional ability and status Nutritional status Physical activity Advanced directives List of other physicians Hospitalizations, surgeries, and ER visits in previous 12 months Vitals Screenings to include cognitive, depression, and falls Referrals and appointments  In addition, I have reviewed and discussed with patient certain preventive protocols, quality metrics, and best practice recommendations. A written personalized care plan for preventive services as well as general preventive health recommendations were provided to patient.     Tillie Rung, LPN   0/98/1191   After Visit Summary: (In Person-Printed) AVS printed and given to the patient  Nurse Notes: None

## 2023-10-03 NOTE — Patient Instructions (Addendum)
Cristian Gonzales , Thank you for taking time to come for your Medicare Wellness Visit. I appreciate your ongoing commitment to your health goals. Please review the following plan we discussed and let me know if I can assist you in the future.   Referrals/Orders/Follow-Ups/Clinician Recommendations:     This is a list of the screening recommended for you and due dates:  Health Maintenance  Topic Date Due   Pneumonia Vaccine (1 of 2 - PCV) Never done   Zoster (Shingles) Vaccine (1 of 2) Never done   COVID-19 Vaccine (3 - 2024-25 season) 05/08/2023   Medicare Annual Wellness Visit  10/02/2024   DTaP/Tdap/Td vaccine (3 - Td or Tdap) 06/16/2025   Cologuard (Stool DNA test)  08/24/2026   Flu Shot  Completed   Hepatitis C Screening  Completed   HPV Vaccine  Aged Out    Advanced directives: (Declined) Advance directive discussed with you today. Even though you declined this today, please call our office should you change your mind, and we can give you the proper paperwork for you to fill out.  Next Medicare Annual Wellness Visit scheduled for next year: Yes

## 2023-10-04 ENCOUNTER — Other Ambulatory Visit (HOSPITAL_COMMUNITY): Payer: Self-pay

## 2023-10-04 ENCOUNTER — Ambulatory Visit: Payer: Medicare PPO | Attending: Cardiology | Admitting: Pharmacist

## 2023-10-04 ENCOUNTER — Telehealth: Payer: Self-pay | Admitting: Pharmacy Technician

## 2023-10-04 ENCOUNTER — Telehealth: Payer: Self-pay | Admitting: Pharmacist

## 2023-10-04 ENCOUNTER — Encounter: Payer: Self-pay | Admitting: Pharmacist

## 2023-10-04 DIAGNOSIS — T466X5D Adverse effect of antihyperlipidemic and antiarteriosclerotic drugs, subsequent encounter: Secondary | ICD-10-CM | POA: Diagnosis not present

## 2023-10-04 DIAGNOSIS — I63412 Cerebral infarction due to embolism of left middle cerebral artery: Secondary | ICD-10-CM | POA: Diagnosis not present

## 2023-10-04 DIAGNOSIS — M791 Myalgia, unspecified site: Secondary | ICD-10-CM | POA: Diagnosis not present

## 2023-10-04 DIAGNOSIS — E782 Mixed hyperlipidemia: Secondary | ICD-10-CM

## 2023-10-04 NOTE — Telephone Encounter (Signed)
REPATHA AND PRALUENT REJECTED, REPATHA COMES UP PREFERRED  Pharmacy Patient Advocate Encounter   Received notification from Pt Calls Messages that prior authorization for Repatha SureClick 140MG /ML auto-injectors is required/requested.   Insurance verification completed.   The patient is insured through [employer] .   Per test claim: PA required; PA submitted to above mentioned insurance via CoverMyMeds Key/confirmation #/EOC [id number] Status is pending

## 2023-10-04 NOTE — Patient Instructions (Signed)
It was nice meeting you today  We would like your LDL (bad cholesterol) to be less than 55  The medication we discussed today is called Repatha which is an injection you would take once every 2 weeks  I will complete the prior authorization for you and contact you when it is approved  Once you start the medication we will recheck your fasting lipid panel in 2-3 months  Please let us know if you have any questions   Laural Golden, PharmD, BCACP, CDCES, CPP [address], [address] [place], [place], [address] Phone: [phone], Fax: [phone]

## 2023-10-04 NOTE — Telephone Encounter (Signed)
Please complete PA for Repatha

## 2023-10-04 NOTE — Progress Notes (Unsigned)
Patient ID: Cristian Gonzales                 DOB: Jan 10, 1956                    MRN: [id number]     HPI: Cristian Gonzales is a 68 y.o. male patient referred to lipid clinic by Dr Bjorn Pippin. PMH is significant for CVA, HLD, CHF, A fib, obesity, depression, anxiety, and statin intolerance.  Patient presents today to discuss lipid management. Has multiple statins in the past and all caused joint pains.   Had back surgery in November 2024. Was previously going to gym 5x weekly. Now only able to go once or twice a week.  Current Medications: N/A  Intolerances:  Atorvastatin  Pravastatin  Risk Factors:  Stroke CHF HTN Family history   LDL goal: <55  Labs: TC 244, Trigs 127, HDL 43, LDL 178 (08/23/23)  Past Medical History:  Diagnosis Date   Anxiety    Arthritis    Cervical spine fracture (HCC)    13 -diving accident, no neurolgic sequelae   Depression    Paroxysmal atrial fibrillation (HCC)    Stroke (HCC)    Syncope 11/27/2012   From Tramadol    Current Outpatient Medications on File Prior to Visit  Medication Sig Dispense Refill   acetaminophen (TYLENOL) 325 MG tablet Take 650 mg by mouth as needed for mild pain (pain score 1-3) or moderate pain (pain score 4-6). (Patient not taking: Reported on 08/23/2023)     apixaban (ELIQUIS) 5 MG TABS tablet      atorvastatin (LIPITOR) 80 MG tablet TAKE 1 TABLET EVERY DAY (Patient not taking: Reported on 08/23/2023) 90 tablet 4   buPROPion (WELLBUTRIN XL) 150 MG 24 hr tablet TAKE 1 TABLET BY MOUTH EVERY DAY 90 tablet 1   carvedilol (COREG) 3.125 MG tablet TAKE 1 TABLET BY MOUTH TWICE A DAY WITH FOOD 180 tablet 1   ibuprofen (ADVIL) 200 MG tablet Take 400 mg by mouth as needed for mild pain (pain score 1-3) or moderate pain (pain score 4-6). (Patient not taking: Reported on 08/23/2023)     No current facility-administered medications on file prior to visit.    Allergies  Allergen Reactions   Tramadol Other (See Comments)     Passed out   Metoprolol Other (See Comments)    " Made my head feel like it was going to blow up"    Morphine Other (See Comments)    Left red bumps on the back and did not work   Trazodone And Nefazodone Other (See Comments)    Made the patient feel flushed,with blurred vision and muscle cramps     Assessment/Plan:  1. Hyperlipidemia - Patient last LDL 178 which is well above goal of <55. Aggressive goal due to history of CVA and CAD. Intolerant to statins. Recommend starting PCSK9i.  Using demo pen, educated patient on mechanism of action, storage, site selection, administration, and possible adverse effects. Will complete PA and contact patient when approved. Recheck lipid panel in 2-3 months.  Start Repatha/Praluent q 2 weeks Recheck fasting lipid panel in 2-3 months  Laural Golden, PharmD, BCACP, CDCES, CPP [address] [place], [place], [address] Phone: [phone], Fax: [phone]

## 2023-10-05 ENCOUNTER — Telehealth: Payer: Self-pay | Admitting: Pharmacy Technician

## 2023-10-05 ENCOUNTER — Other Ambulatory Visit (HOSPITAL_COMMUNITY): Payer: Self-pay

## 2023-10-05 MED ORDER — REPATHA SURECLICK 140 MG/ML ~~LOC~~ SOAJ: 1.0000 mL | SUBCUTANEOUS | 5 refills | Status: DC

## 2023-10-05 NOTE — Telephone Encounter (Signed)
Patient Advocate Encounter   The patient was approved for a [employer] grant that will help cover the cost of REPATHA Total amount awarded, 2500.00.  Effective: 09/05/23 - 09/03/24   ZOX:[id number] [id number]   I will call [facility] their preferred pharmacy and give them the grant information as soon as I get off the phone with this other insurance

## 2023-10-05 NOTE — Addendum Note (Signed)
Addended by: Cheree Ditto on: 10/05/2023 11:38 AM   Modules accepted: Orders

## 2023-10-05 NOTE — Telephone Encounter (Signed)
Contacted patient to let him know Repatha approved and lipid panel ordered

## 2023-10-05 NOTE — Telephone Encounter (Signed)
Pharmacy Patient Advocate Encounter  Received notification from [facility] that Prior Authorization for  Repatha SureClick 140MG /ML auto-injectors  has been APPROVED from 09/07/23 to 09/05/24. Ran test claim, Copay is $497.00. This test claim was processed through [facility]- copay amounts may vary at other pharmacies due to pharmacy/plan contracts, or as the patient moves through the different stages of their insurance plan.   I ALSO WENT AHEAD AND GOT THEM A GRANT TO COVER THAT HIGH COPAY BECAUSE OF THEIR DEDUCTIBLE.   Patient Advocate Encounter   The patient was approved for a [employer] grant that will help cover the cost of REPATHA Total amount awarded, 2500.00.  Effective: 09/05/23 - 09/03/24   [id number] [id number]   I will call [facility] their preferred pharmacy and give them the grant information as soon as I get off the phone with this other insurance  PA #/Case ID/Reference #: [id number]

## 2023-10-05 NOTE — Telephone Encounter (Signed)
I gave [facility] the grant info and they processed and it will be ready for the patient at 0.00 charge

## 2023-10-17 ENCOUNTER — Ambulatory Visit (INDEPENDENT_AMBULATORY_CARE_PROVIDER_SITE_OTHER): Payer: Medicare PPO

## 2023-10-17 DIAGNOSIS — I63412 Cerebral infarction due to embolism of left middle cerebral artery: Secondary | ICD-10-CM | POA: Diagnosis not present

## 2023-11-01 ENCOUNTER — Other Ambulatory Visit: Payer: Self-pay | Admitting: Adult Health

## 2023-11-01 DIAGNOSIS — I4891 Unspecified atrial fibrillation: Secondary | ICD-10-CM

## 2023-11-02 LAB — CUP PACEART REMOTE DEVICE CHECK
Date Time Interrogation Session: 20250205230417
Implantable Pulse Generator Implant Date: 20220714

## 2023-11-03 DIAGNOSIS — H25812 Combined forms of age-related cataract, left eye: Secondary | ICD-10-CM | POA: Diagnosis not present

## 2023-11-08 ENCOUNTER — Encounter: Payer: Self-pay | Admitting: Cardiology

## 2023-11-09 DIAGNOSIS — H2512 Age-related nuclear cataract, left eye: Secondary | ICD-10-CM | POA: Diagnosis not present

## 2023-11-11 DIAGNOSIS — H25812 Combined forms of age-related cataract, left eye: Secondary | ICD-10-CM | POA: Diagnosis not present

## 2023-11-15 DIAGNOSIS — M4714 Other spondylosis with myelopathy, thoracic region: Secondary | ICD-10-CM | POA: Diagnosis not present

## 2023-11-15 DIAGNOSIS — Z6841 Body Mass Index (BMI) 40.0 and over, adult: Secondary | ICD-10-CM | POA: Diagnosis not present

## 2023-11-21 ENCOUNTER — Ambulatory Visit (INDEPENDENT_AMBULATORY_CARE_PROVIDER_SITE_OTHER): Payer: Medicare PPO

## 2023-11-21 DIAGNOSIS — I63412 Cerebral infarction due to embolism of left middle cerebral artery: Secondary | ICD-10-CM | POA: Diagnosis not present

## 2023-11-22 LAB — CUP PACEART REMOTE DEVICE CHECK
Date Time Interrogation Session: 20250316230623
Implantable Pulse Generator Implant Date: 20220714

## 2023-11-27 ENCOUNTER — Encounter: Payer: Self-pay | Admitting: Cardiology

## 2023-11-28 NOTE — Progress Notes (Signed)
 Carelink Summary Report / Loop Recorder

## 2023-12-03 ENCOUNTER — Other Ambulatory Visit: Payer: Self-pay | Admitting: Adult Health

## 2023-12-03 DIAGNOSIS — I4891 Unspecified atrial fibrillation: Secondary | ICD-10-CM

## 2023-12-07 ENCOUNTER — Other Ambulatory Visit: Payer: Self-pay | Admitting: Adult Health

## 2023-12-07 DIAGNOSIS — H2511 Age-related nuclear cataract, right eye: Secondary | ICD-10-CM | POA: Diagnosis not present

## 2023-12-07 DIAGNOSIS — M26609 Unspecified temporomandibular joint disorder, unspecified side: Secondary | ICD-10-CM

## 2023-12-07 DIAGNOSIS — F39 Unspecified mood [affective] disorder: Secondary | ICD-10-CM

## 2023-12-07 DIAGNOSIS — H9192 Unspecified hearing loss, left ear: Secondary | ICD-10-CM

## 2023-12-07 NOTE — Telephone Encounter (Signed)
  The original prescription was discontinued on 02/07/2023 by Silvana Newness, RPH. Renewing this prescription may not be appropriate

## 2023-12-09 DIAGNOSIS — H25811 Combined forms of age-related cataract, right eye: Secondary | ICD-10-CM | POA: Diagnosis not present

## 2023-12-26 ENCOUNTER — Ambulatory Visit (INDEPENDENT_AMBULATORY_CARE_PROVIDER_SITE_OTHER): Payer: Medicare PPO

## 2023-12-26 DIAGNOSIS — I63412 Cerebral infarction due to embolism of left middle cerebral artery: Secondary | ICD-10-CM | POA: Diagnosis not present

## 2023-12-26 LAB — CUP PACEART REMOTE DEVICE CHECK
Date Time Interrogation Session: 20250420230810
Implantable Pulse Generator Implant Date: 20220714

## 2023-12-27 ENCOUNTER — Encounter: Payer: Self-pay | Admitting: Cardiology

## 2024-01-02 ENCOUNTER — Other Ambulatory Visit: Payer: Self-pay | Admitting: Adult Health

## 2024-01-02 DIAGNOSIS — I4891 Unspecified atrial fibrillation: Secondary | ICD-10-CM

## 2024-01-10 NOTE — Addendum Note (Signed)
 Addended by: Edra Govern D on: 01/10/2024 01:59 PM   Modules accepted: Orders

## 2024-01-10 NOTE — Progress Notes (Signed)
 Carelink Summary Report / Loop Recorder

## 2024-01-24 NOTE — Progress Notes (Deleted)
     Shenouda Genova T. Franklyn Cafaro, MD, CAQ Sports Medicine [facility] [address] [place], [address]  Phone: [phone]  FAX: [phone]  Cristian Gonzales - 68 y.o. male  MRN [id number]  Date of Birth: 02/27/1956  Date: 01/25/2024  PCP: Alto Atta, NP  Referral: Alto Atta, NP  No chief complaint on file.  Subjective:   Cristian Gonzales is a 68 y.o. very pleasant male patient with There is no height or weight on file to calculate BMI. who presents with the following:  Is a very pleasant patient who is a patient of [facility], and he presents with some ongoing knee pain.    Review of Systems is noted in the HPI, as appropriate  Objective:   There were no vitals taken for this visit.  GEN: No acute distress; alert,appropriate. PULM: Breathing comfortably in no respiratory distress PSYCH: Normally interactive.   Laboratory and Imaging Data:  Assessment and Plan:   ***

## 2024-01-25 ENCOUNTER — Ambulatory Visit (INDEPENDENT_AMBULATORY_CARE_PROVIDER_SITE_OTHER): Admitting: Internal Medicine

## 2024-01-25 ENCOUNTER — Ambulatory Visit: Admitting: Family Medicine

## 2024-01-25 ENCOUNTER — Encounter: Payer: Self-pay | Admitting: Internal Medicine

## 2024-01-25 VITALS — BP 120/62 | HR 77 | Temp 98.3°F | Ht 65.0 in | Wt 245.7 lb

## 2024-01-25 DIAGNOSIS — G8929 Other chronic pain: Secondary | ICD-10-CM | POA: Diagnosis not present

## 2024-01-25 DIAGNOSIS — M25562 Pain in left knee: Secondary | ICD-10-CM | POA: Diagnosis not present

## 2024-01-25 MED ORDER — MELOXICAM 7.5 MG PO TABS: 7.5000 mg | ORAL_TABLET | Freq: Every day | ORAL | 0 refills | Status: DC

## 2024-01-25 NOTE — Progress Notes (Signed)
 Established Patient Office Visit     CC/Reason for Visit: Chronic left knee pain  HPI: Cristian Gonzales is a 68 y.o. male who is coming in today for the above mentioned reasons.  Left knee pain has been present for months if not years.  He has already been told he has bone-on-bone arthritis.  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  He has never seen orthopedics for his left knee.   Past Medical/Surgical History: Past Medical History:  Diagnosis Date   Anxiety    Arthritis    Cervical spine fracture (HCC)    13 -diving accident, no neurolgic sequelae   Depression    Paroxysmal atrial fibrillation (HCC)    Stroke (HCC)    Syncope 11/27/2012   From Tramadol     Past Surgical History:  Procedure Laterality Date   BUBBLE STUDY  03/19/2021   Procedure: BUBBLE STUDY;  Surgeon: Loyde Rule, MD;  Location: [facility] ENDOSCOPY;  Service: Cardiovascular;;   CERVICAL DISCECTOMY  2004   dr Joanette Moynahan   KNEE SURGERY  1981   right   LOOP RECORDER INSERTION N/A 03/19/2021   Procedure: LOOP RECORDER INSERTION;  Surgeon: Boyce Byes, MD;  Location: [facility] INVASIVE CV LAB;  Service: Cardiovascular;  Laterality: N/A;   LUMBAR LAMINECTOMY/DECOMPRESSION MICRODISCECTOMY N/A 07/27/2023   Procedure: Thoracic Eleven-Twelve Decompression;  Surgeon: Gearl Keens, MD;  Location: [facility] OR;  Service: Neurosurgery;  Laterality: N/A;   NECK SURGERY     REPLACEMENT TOTAL KNEE     right   REPLACEMENT TOTAL KNEE     TEE WITHOUT CARDIOVERSION N/A 03/19/2021   Procedure: TRANSESOPHAGEAL ECHOCARDIOGRAM (TEE);  Surgeon: Loyde Rule, MD;  Location: [facility] ENDOSCOPY;  Service: Cardiovascular;  Laterality: N/A;    Social History:  reports that he has been smoking cigars. He has never used smokeless tobacco. He reports that he does not drink alcohol and does not use drugs.  Allergies: Allergies  Allergen Reactions   Tramadol  Other (See Comments)    Passed out   Metoprolol  Other (See Comments)    " Made my head feel like  it was going to blow up"    Morphine Other (See Comments)    Left red bumps on the back and did not work   Trazodone  And Nefazodone Other (See Comments)    Made the patient feel flushed,with blurred vision and muscle cramps     Family History:  Family History  Problem Relation Age of Onset   Heart attack Father 57       Sudden Cardiac Death   Liver cancer Mother 70     Current Outpatient Medications:    apixaban  (ELIQUIS ) 5 MG TABS tablet, Take 5 mg by mouth 2 (two) times daily., Disp: , Rfl:    buPROPion  (WELLBUTRIN  XL) 150 MG 24 hr tablet, TAKE 1 TABLET BY MOUTH EVERY DAY, Disp: 90 tablet, Rfl: 1   carvedilol  (COREG ) 3.125 MG tablet, TAKE 1 TABLET BY MOUTH TWICE A DAY WITH FOOD, Disp: 60 tablet, Rfl: 0   meloxicam  (MOBIC ) 7.5 MG tablet, Take 1 tablet (7.5 mg total) by mouth daily., Disp: 30 tablet, Rfl: 0   Evolocumab  (REPATHA  SURECLICK) 140 MG/ML SOAJ, Inject 140 mg into the skin every 14 (fourteen) days. (Patient not taking: Reported on 01/25/2024), Disp: 2 mL, Rfl: 5  Review of Systems:  Negative unless indicated in HPI.   Physical Exam: Vitals:   01/25/24 1425  BP: 120/62  Pulse: 77  Temp: 98.3 F (36.8  C)  TempSrc: Oral  SpO2: 97%  Weight: 245 lb 11.2 oz (111.4 kg)  Height: 5\' 5"  (1.651 m)    Body mass index is 40.89 kg/m.   Impression and Plan:  Chronic pain of left knee -     Ambulatory referral to Orthopedic Surgery -     Meloxicam ; Take 1 tablet (7.5 mg total) by mouth daily.  Dispense: 30 tablet; Refill: 0   - Will place referral to orthopedics, hopefully a cortisone injection can buy him some time.  Will also need to discuss potential knee replacement surgery. - In the meantime have advised bracing and meloxicam .  Time spent:20 minutes reviewing chart, interviewing and examining patient and formulating plan of care.     Marguerita Shih, MD [facility]

## 2024-02-07 ENCOUNTER — Other Ambulatory Visit (INDEPENDENT_AMBULATORY_CARE_PROVIDER_SITE_OTHER): Payer: Self-pay

## 2024-02-07 ENCOUNTER — Encounter: Payer: Self-pay | Admitting: Orthopaedic Surgery

## 2024-02-07 ENCOUNTER — Telehealth: Payer: Self-pay | Admitting: Adult Health

## 2024-02-07 ENCOUNTER — Telehealth: Payer: Self-pay

## 2024-02-07 ENCOUNTER — Ambulatory Visit: Admitting: Orthopaedic Surgery

## 2024-02-07 VITALS — Ht 64.0 in | Wt 238.0 lb

## 2024-02-07 DIAGNOSIS — M1712 Unilateral primary osteoarthritis, left knee: Secondary | ICD-10-CM | POA: Diagnosis not present

## 2024-02-07 DIAGNOSIS — Z6841 Body Mass Index (BMI) 40.0 and over, adult: Secondary | ICD-10-CM | POA: Diagnosis not present

## 2024-02-07 MED ORDER — LIDOCAINE HCL 1 % IJ SOLN
2.0000 mL | INTRAMUSCULAR | Status: AC | PRN
  Administered 2024-02-07: 2 mL

## 2024-02-07 MED ORDER — BUPIVACAINE HCL 0.5 % IJ SOLN
2.0000 mL | INTRAMUSCULAR | Status: AC | PRN
  Administered 2024-02-07: 2 mL via INTRA_ARTICULAR

## 2024-02-07 MED ORDER — METHYLPREDNISOLONE ACETATE 40 MG/ML IJ SUSP
40.0000 mg | INTRAMUSCULAR | Status: AC | PRN
  Administered 2024-02-07: 40 mg via INTRA_ARTICULAR

## 2024-02-07 NOTE — Telephone Encounter (Signed)
 Clearance form given to patient to take to his PCP for medical clearance. Patient aware that we cannot proceed with scheduling surgery until form has been sent back to us .

## 2024-02-07 NOTE — Progress Notes (Signed)
 Office Visit Note   Patient: Cristian Gonzales           Date of Birth: Oct 04, 1955           MRN: [id number] Visit Date: 02/07/2024              Requested by: Zilphia Hilt, Charyl Coppersmith, MD [address] [place],  [place] [address] PCP: Alto Atta, NP   Assessment & Plan: Visit Diagnoses:  1. Primary osteoarthritis of left knee   2. Body mass index 40.0-44.9, adult (HCC)     Plan: History of Present Illness Cristian Gonzales is a 68 year old male with a history of right knee replacement who presents with worsening left knee pain.  He has experienced left knee pain and deformity for several years, with progressive worsening. The pain is primarily medial but now radiates circumferentially. It is described as excruciating, causing tears, and the knee gives way, especially when turning, with popping and cracking sounds. He massages the knee for relief and repositioning.  He uses a cane, initially obtained after back surgery in November, when he experienced numbness from the hips down. Once sensation returned, the knee pain became more pronounced, and the knee began to 'pop out' more frequently. For pain management, he uses ice and THC drinks, which provide temporary relief for a couple of hours.  His medical history includes a right knee replacement many years ago and back surgery in November. He takes Eliquis  and carvedilol . No catching or locking of the knee is reported, but the knee gives way, especially when turning, with associated popping and cracking sounds.  Physical Exam MUSCULOSKELETAL: No effusion in left knee. Pain along medial joint line of left knee.  Varus deformity in left knee.  Pain and crepitus with range of motion.  Results RADIOLOGY Left knee X-ray: Severe osteoarthritis with varus deformity, extensive osteophytes, and lateral tibial subluxation  Assessment and Plan Left knee osteoarthritis with varus deformity Chronic left knee pain with severe  osteoarthritis, varus deformity, and mechanical symptoms. X-rays show bone-on-bone contact. Total knee replacement recommended.  Explained why he is not a candidate for partial replacement. - Administer cortisone injection to the left knee for temporary pain relief. - Obtain clearance from primary care physician for knee replacement surgery. - Measure height and weight to assess BMI for surgical eligibility. - Schedule knee replacement surgery after obtaining necessary clearances and ensuring BMI is below 40.  Goal weight 230 pounds. - He will follow-up with us  once he has met his goal weight.  Explained why we cannot confirm surgery date until goal weight has been met.  Follow-Up Instructions: No follow-ups on file.   Orders:  Orders Placed This Encounter  Procedures   Large Joint Inj: L knee   XR KNEE 3 VIEW LEFT   No orders of the defined types were placed in this encounter.     Procedures: Large Joint Inj: L knee on 02/07/2024 9:27 AM Details: 22 G needle Medications: 2 mL bupivacaine  0.5 %; 2 mL lidocaine  1 %; 40 mg methylPREDNISolone  acetate 40 MG/ML Outcome: tolerated well, no immediate complications Patient was prepped and draped in the usual sterile fashion.       Clinical Data: No additional findings.   Subjective: Chief Complaint  Patient presents with   Left Knee - Pain    HPI  Review of Systems  Constitutional: Negative.   HENT: Negative.    Eyes: Negative.   Respiratory: Negative.    Cardiovascular: Negative.  Gastrointestinal: Negative.   Endocrine: Negative.   Genitourinary: Negative.   Skin: Negative.   Allergic/Immunologic: Negative.   Neurological: Negative.   Hematological: Negative.   Psychiatric/Behavioral: Negative.    All other systems reviewed and are negative.    Objective: Vital Signs: Ht 5\' 4"  (1.626 m)   Wt 238 lb (108 kg)   BMI 40.85 kg/m   Physical Exam Vitals and nursing note reviewed.  Constitutional:       Appearance: He is well-developed.  HENT:     Head: Normocephalic and atraumatic.  Eyes:     Pupils: Pupils are equal, round, and reactive to light.  Pulmonary:     Effort: Pulmonary effort is normal.  Abdominal:     Palpations: Abdomen is soft.  Musculoskeletal:        General: Normal range of motion.     Cervical back: Neck supple.  Skin:    General: Skin is warm.  Neurological:     Mental Status: He is alert and oriented to person, place, and time.  Psychiatric:        Behavior: Behavior normal.        Thought Content: Thought content normal.        Judgment: Judgment normal.     Ortho Exam  Specialty Comments:  No specialty comments available.  Imaging: XR KNEE 3 VIEW LEFT Result Date: 02/07/2024 X-rays of the left knee show advanced tricompartmental degenerative joint disease.  Bone-on-bone joint space narrowing of the medial compartment with varus deformity    PMFS History: Patient Active Problem List   Diagnosis Date Noted   Primary osteoarthritis of left knee 02/07/2024   Body mass index 40.0-44.9, adult (HCC) 02/07/2024   Myelopathy (HCC) 07/27/2023   Spinal stenosis of lumbar region 07/25/2023   Cord compression myelopathy (HCC) 07/25/2023   Paroxysmal atrial fibrillation (HCC) 07/25/2023   Essential hypertension 07/25/2023   Obesity (BMI 30-39.9) 07/25/2023   Atrial fibrillation with RVR (HCC) 02/07/2023   Elevated troponin 02/07/2023   Hypokalemia 02/07/2023   SIRS (systemic inflammatory response syndrome) (HCC) 02/07/2023   Pulmonary nodule 02/07/2023   Chronic diastolic CHF (congestive heart failure) (HCC) 02/07/2023   Chest pain 02/06/2023   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   Hyperlipidemia 07/30/2015   Lumbar disc disease with radiculopathy 07/29/2014   Left-sided thoracic back pain 06/05/2014   Numbness and tingling sensation of skin 06/25/2013   Syncope 11/27/2012   Depression 11/27/2012   Epididymitis, left 11/23/2010   Allergic rhinitis  11/07/2009   Extrinsic asthma 02/05/2008   Anxiety state 08/08/2007   Past Medical History:  Diagnosis Date   Anxiety    Arthritis    Cervical spine fracture (HCC)    13 -diving accident, no neurolgic sequelae   Depression    Paroxysmal atrial fibrillation (HCC)    Stroke (HCC)    Syncope 11/27/2012   From Tramadol     Family History  Problem Relation Age of Onset   Heart attack Father 31       Sudden Cardiac Death   Liver cancer Mother 81    Past Surgical History:  Procedure Laterality Date   BUBBLE STUDY  03/19/2021   Procedure: BUBBLE STUDY;  Surgeon: Loyde Rule, MD;  Location: [facility] ENDOSCOPY;  Service: Cardiovascular;;   CERVICAL DISCECTOMY  2004   dr Joanette Moynahan   KNEE SURGERY  1981   right   LOOP RECORDER INSERTION N/A 03/19/2021   Procedure: LOOP RECORDER INSERTION;  Surgeon: Boyce Byes, MD;  Location: [facility]  INVASIVE CV LAB;  Service: Cardiovascular;  Laterality: N/A;   LUMBAR LAMINECTOMY/DECOMPRESSION MICRODISCECTOMY N/A 07/27/2023   Procedure: Thoracic Eleven-Twelve Decompression;  Surgeon: Gearl Keens, MD;  Location: [facility] OR;  Service: Neurosurgery;  Laterality: N/A;   NECK SURGERY     REPLACEMENT TOTAL KNEE     right   REPLACEMENT TOTAL KNEE     TEE WITHOUT CARDIOVERSION N/A 03/19/2021   Procedure: TRANSESOPHAGEAL ECHOCARDIOGRAM (TEE);  Surgeon: Loyde Rule, MD;  Location: [facility] ENDOSCOPY;  Service: Cardiovascular;  Laterality: N/A;   Social History   Occupational History   Not on file  Tobacco Use   Smoking status: Some Days    Types: Cigars    Last attempt to quit: 01/04/2010    Years since quitting: 14.1   Smokeless tobacco: Never   Tobacco comments:    Smokes 1 cigar per day  Vaping Use   Vaping status: Never Used  Substance and Sexual Activity   Alcohol use: No   Drug use: No   Sexual activity: Not on file

## 2024-02-07 NOTE — Telephone Encounter (Signed)
 Noted

## 2024-02-07 NOTE — Telephone Encounter (Signed)
 [facility] Surgical Clearance form to be filled out--placed in providers folder.  Please fax to [phone] upon completion.

## 2024-02-09 NOTE — Telephone Encounter (Signed)
 Pt needs an appt. Called pt 2x no answer.

## 2024-02-09 NOTE — Telephone Encounter (Signed)
 Have not seen clearance. Called Ortho but stated they were closed at the time. Will try again later.

## 2024-02-10 NOTE — Telephone Encounter (Signed)
 Pt has been scheduled.

## 2024-02-14 DIAGNOSIS — Z6839 Body mass index (BMI) 39.0-39.9, adult: Secondary | ICD-10-CM | POA: Diagnosis not present

## 2024-02-14 DIAGNOSIS — M48062 Spinal stenosis, lumbar region with neurogenic claudication: Secondary | ICD-10-CM | POA: Diagnosis not present

## 2024-02-15 ENCOUNTER — Encounter: Payer: Self-pay | Admitting: Adult Health

## 2024-02-15 ENCOUNTER — Ambulatory Visit: Admitting: Adult Health

## 2024-02-15 VITALS — BP 120/76 | HR 56 | Temp 98.2°F | Ht 64.0 in | Wt 236.0 lb

## 2024-02-15 DIAGNOSIS — M25562 Pain in left knee: Secondary | ICD-10-CM

## 2024-02-15 DIAGNOSIS — Z01818 Encounter for other preprocedural examination: Secondary | ICD-10-CM

## 2024-02-15 DIAGNOSIS — G8929 Other chronic pain: Secondary | ICD-10-CM

## 2024-02-15 MED ORDER — OXYCODONE-ACETAMINOPHEN 5-325 MG PO TABS: 1.0000 | ORAL_TABLET | ORAL | 0 refills | Status: AC | PRN

## 2024-02-15 NOTE — Progress Notes (Signed)
 Subjective:    Patient ID: Cristian Gonzales, male    DOB: 30-Mar-1956, 68 y.o.   MRN: [id number]  HPI 68 year old male who  has a past medical history of Anxiety, Arthritis, Cervical spine fracture (HCC), Depression, Paroxysmal atrial fibrillation (HCC), Stroke [facility]), and Syncope (11/27/2012).  He presents to the office today for preoperative clearance. He plans to have a left knee replacement done by Dr. Christiane Cowing. Surgery has not been scheduled yet as he needs to lose weight prior to surgery. His goal is below a BMI of 40 with a goal weight under 230 lbs. He reports significant pain in his left knee that is not relieved by OTC medication.   Wt Readings from Last 10 Encounters:  02/15/24 236 lb (107 kg)  02/07/24 238 lb (108 kg)  01/25/24 245 lb 11.2 oz (111.4 kg)  10/03/23 244 lb 6.4 oz (110.9 kg)  08/23/23 242 lb (109.8 kg)  08/09/23 242 lb (109.8 kg)  07/27/23 250 lb (113.4 kg)  04/15/23 245 lb (111.1 kg)  02/21/23 251 lb (113.9 kg)  02/18/23 250 lb (113.4 kg)   Review of Systems See HPI   Past Medical History:  Diagnosis Date   Anxiety    Arthritis    Cervical spine fracture (HCC)    13 -diving accident, no neurolgic sequelae   Depression    Paroxysmal atrial fibrillation (HCC)    Stroke (HCC)    Syncope 11/27/2012   From Tramadol     Social History   Socioeconomic History   Marital status: Married    Spouse name: Not on file   Number of children: Not on file   Years of education: Not on file   Highest education level: Not on file  Occupational History   Not on file  Tobacco Use   Smoking status: Some Days    Types: Cigars    Last attempt to quit: 01/04/2010    Years since quitting: 14.1   Smokeless tobacco: Never   Tobacco comments:    Smokes 1 cigar per day  Vaping Use   Vaping status: Never Used  Substance and Sexual Activity   Alcohol use: No   Drug use: No   Sexual activity: Not on file  Other Topics Concern   Not on file  Social History Narrative   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    Married for 25 years    5 children ( 3 in [place] and 2 in [place])   Social Drivers of Health   Financial Resource Strain: Low Risk  (10/03/2023)   Overall Financial Resource Strain (CARDIA)    Difficulty of Paying Living Expenses: Not hard at all  Food Insecurity: No Food Insecurity (10/03/2023)   Hunger Vital Sign    Worried About Running Out of Food in the Last Year: Never true    Ran Out of Food in the Last Year: Never true  Transportation Needs: No Transportation Needs (10/03/2023)   PRAPARE - Administrator, Civil Service (Medical): No    Lack of Transportation (Non-Medical): No  Physical Activity: Sufficiently Active (10/03/2023)   Exercise Vital Sign    Days of Exercise per Week: 5 days    Minutes of Exercise per Session: 30 min  Stress: No Stress Concern Present (10/03/2023)   [employer] of Occupational Health - Occupational Stress Questionnaire    Feeling of Stress : Not at all  Social Connections: Moderately Integrated (10/03/2023)   Social  Connection and Isolation Panel [NHANES]    Frequency of Communication with Friends and Family: More than three times a week    Frequency of Social Gatherings with Friends and Family: More than three times a week    Attends Religious Services: Never    Database administrator or Organizations: Yes    Attends Engineer, structural: More than 4 times per year    Marital Status: Married  Catering manager Violence: Not At Risk (10/03/2023)   Humiliation, Afraid, Rape, and Kick questionnaire    Fear of Current or Ex-Partner: No    Emotionally Abused: No    Physically Abused: No    Sexually Abused: No    Past Surgical History:  Procedure Laterality Date   BUBBLE STUDY  03/19/2021   Procedure: BUBBLE STUDY;  Surgeon: Loyde Rule, MD;  Location: [facility] ENDOSCOPY;  Service: Cardiovascular;;   CERVICAL DISCECTOMY  2004   dr Joanette Moynahan   KNEE SURGERY  1981   right   LOOP  RECORDER INSERTION N/A 03/19/2021   Procedure: LOOP RECORDER INSERTION;  Surgeon: Boyce Byes, MD;  Location: [facility] INVASIVE CV LAB;  Service: Cardiovascular;  Laterality: N/A;   LUMBAR LAMINECTOMY/DECOMPRESSION MICRODISCECTOMY N/A 07/27/2023   Procedure: Thoracic Eleven-Twelve Decompression;  Surgeon: Gearl Keens, MD;  Location: [facility] OR;  Service: Neurosurgery;  Laterality: N/A;   NECK SURGERY     REPLACEMENT TOTAL KNEE     right   REPLACEMENT TOTAL KNEE     TEE WITHOUT CARDIOVERSION N/A 03/19/2021   Procedure: TRANSESOPHAGEAL ECHOCARDIOGRAM (TEE);  Surgeon: Loyde Rule, MD;  Location: [facility] ENDOSCOPY;  Service: Cardiovascular;  Laterality: N/A;    Family History  Problem Relation Age of Onset   Heart attack Father 96       Sudden Cardiac Death   Liver cancer Mother 34    Allergies  Allergen Reactions   Tramadol  Other (See Comments)    Passed out   Metoprolol  Other (See Comments)     Made my head feel like it was going to blow up    Morphine Other (See Comments)    Left red bumps on the back and did not work   Trazodone  And Nefazodone Other (See Comments)    Made the patient feel flushed,with blurred vision and muscle cramps     Current Outpatient Medications on File Prior to Visit  Medication Sig Dispense Refill   apixaban  (ELIQUIS ) 5 MG TABS tablet Take 5 mg by mouth 2 (two) times daily.     buPROPion  (WELLBUTRIN  XL) 150 MG 24 hr tablet TAKE 1 TABLET BY MOUTH EVERY DAY 90 tablet 1   carvedilol  (COREG ) 3.125 MG tablet TAKE 1 TABLET BY MOUTH TWICE A DAY WITH FOOD 60 tablet 0   Evolocumab  (REPATHA  SURECLICK) 140 MG/ML SOAJ Inject 140 mg into the skin every 14 (fourteen) days. 2 mL 5   No current facility-administered medications on file prior to visit.    BP 120/76   Pulse (!) 56   Temp 98.2 F (36.8 C) (Oral)   Ht 5' 4 (1.626 m)   Wt 236 lb (107 kg)   SpO2 97%   BMI 40.51 kg/m       Objective:   Physical Exam Vitals and nursing note reviewed.   Constitutional:      Appearance: Normal appearance.  Cardiovascular:     Rate and Rhythm: Normal rate and regular rhythm.     Pulses: Normal pulses.     Heart sounds: Normal heart  sounds.  Musculoskeletal:        General: Normal range of motion.  Skin:    General: Skin is warm and dry.     Capillary Refill: Capillary refill takes less than 2 seconds.  Neurological:     General: No focal deficit present.     Mental Status: He is alert and oriented to person, place, and time.  Psychiatric:        Mood and Affect: Mood normal.        Thought Content: Thought content normal.        Judgment: Judgment normal.        Assessment & Plan:  1. Preoperative clearance (Primary) - He is low risk for adverse outcome for this surgical procedure. Ok to proceed with surgery once he loses weight  - EKG 12-Lead- Sinus Brady, Rate 59.  - Continue with weight loss measures  - Stop Eliquis  2-5 days prior to surgery - CBC; Future - Basic Metabolic Panel; Future  2. Chronic pain of left knee - He is unable to take NSAIDS or Tramadol . Will give a one time prescription for Percocet  - oxyCODONE -acetaminophen  (PERCOCET/ROXICET) 5-325 MG tablet; Take 1 tablet by mouth every 4 (four) hours as needed for up to 5 days for severe pain (pain score 7-10).  Dispense: 10 tablet; Refill: 0  Alto Atta, NP

## 2024-02-15 NOTE — Progress Notes (Signed)
 Carelink Summary Report / Loop Recorder

## 2024-02-16 ENCOUNTER — Ambulatory Visit: Payer: Self-pay | Admitting: Adult Health

## 2024-02-16 LAB — CBC
HCT: 45.3 % (ref 38.5–50.0)
Hemoglobin: 14.6 g/dL (ref 13.2–17.1)
MCH: 29.3 pg (ref 27.0–33.0)
MCHC: 32.2 g/dL (ref 32.0–36.0)
MCV: 91 fL (ref 80.0–100.0)
MPV: 9.5 fL (ref 7.5–12.5)
Platelets: 303 10*3/uL (ref 140–400)
RBC: 4.98 10*6/uL (ref 4.20–5.80)
RDW: 13.2 % (ref 11.0–15.0)
WBC: 7.8 10*3/uL (ref 3.8–10.8)

## 2024-02-16 LAB — BASIC METABOLIC PANEL WITH GFR
BUN: 13 mg/dL (ref 7–25)
CO2: 26 mmol/L (ref 20–32)
Calcium: 9.2 mg/dL (ref 8.6–10.3)
Chloride: 104 mmol/L (ref 98–110)
Creat: 1.13 mg/dL (ref 0.70–1.35)
Glucose, Bld: 93 mg/dL (ref 65–99)
Potassium: 4.7 mmol/L (ref 3.5–5.3)
Sodium: 141 mmol/L (ref 135–146)
eGFR: 71 mL/min/{1.73_m2} (ref >=60)

## 2024-02-23 ENCOUNTER — Other Ambulatory Visit: Payer: Self-pay | Admitting: Internal Medicine

## 2024-02-23 DIAGNOSIS — G8929 Other chronic pain: Secondary | ICD-10-CM

## 2024-02-23 NOTE — Telephone Encounter (Signed)
  The original prescription was discontinued on 02/15/2024 by Cosimo Diones, CMA. Renewing this prescription may not be appropriate.

## 2024-02-26 NOTE — Progress Notes (Unsigned)
 Cardiology Office Note:    Date:  02/27/2024   ID:  Oneal, Schoenberger [birth year], MRN [id number]  PCP:  Merna Huxley, NP  Cardiologist:  Powell FORBES Sorrow, MD (Inactive)  Electrophysiologist:  None   Referring MD: Merna Huxley, NP   Chief Complaint  Patient presents with   Atrial Fibrillation    History of Present Illness:    Cristian Gonzales is a 68 y.o. male with a hx of atrial fibrillation, CVA, chronic diastolic heart failure, hyperlipidemia who presents for follow-up.  Found have acute CVA 03/2021 and underwent loop recorder placement.  Presented to ED 02/2023 with chest pain, was found to be in atrial flutter with rate 130 bpm.  He converted spontaneously to sinus rhythm.  Started on Eliquis  and metoprolol  at that time.  Since last appointment, he reports he is doing well  Denies any chest pain, dyspnea, lower extremity edema, or palpitations.  Reports some lightheadedness but denies any syncope.  Reports he checks his pulse regularly, no evidence of recurrent A-fib.  He goes to the gym for 3 to 4 days/week, does upper body workout.  He walks his dog daily for about 0.5 miles, can walk up flight of stairs without stopping.  Denies any exertional symptoms.    Past Medical History:  Diagnosis Date   Anxiety    Arthritis    Cervical spine fracture (HCC)    13 -diving accident, no neurolgic sequelae   Depression    Paroxysmal atrial fibrillation (HCC)    Stroke (HCC)    Syncope 11/27/2012   From Tramadol     Past Surgical History:  Procedure Laterality Date   BUBBLE STUDY  03/19/2021   Procedure: BUBBLE STUDY;  Surgeon: Delford Maude BROCKS, MD;  Location: [facility] ENDOSCOPY;  Service: Cardiovascular;;   CERVICAL DISCECTOMY  2004   dr gaither   KNEE SURGERY  1981   right   LOOP RECORDER INSERTION N/A 03/19/2021   Procedure: LOOP RECORDER INSERTION;  Surgeon: Cindie Ole DASEN, MD;  Location: [facility] INVASIVE CV LAB;  Service: Cardiovascular;  Laterality: N/A;   LUMBAR  LAMINECTOMY/DECOMPRESSION MICRODISCECTOMY N/A 07/27/2023   Procedure: Thoracic Eleven-Twelve Decompression;  Surgeon: Onetha Kuba, MD;  Location: [facility] OR;  Service: Neurosurgery;  Laterality: N/A;   NECK SURGERY     REPLACEMENT TOTAL KNEE     right   REPLACEMENT TOTAL KNEE     TEE WITHOUT CARDIOVERSION N/A 03/19/2021   Procedure: TRANSESOPHAGEAL ECHOCARDIOGRAM (TEE);  Surgeon: Delford Maude BROCKS, MD;  Location: [facility] ENDOSCOPY;  Service: Cardiovascular;  Laterality: N/A;    Current Medications: Current Meds  Medication Sig   apixaban  (ELIQUIS ) 5 MG TABS tablet Take 5 mg by mouth 2 (two) times daily.   buPROPion  (WELLBUTRIN  XL) 150 MG 24 hr tablet TAKE 1 TABLET BY MOUTH EVERY DAY   carvedilol  (COREG ) 3.125 MG tablet TAKE 1 TABLET BY MOUTH TWICE A DAY WITH FOOD   Evolocumab  (REPATHA  SURECLICK) 140 MG/ML SOAJ Inject 140 mg into the skin every 14 (fourteen) days.     Allergies:   Tramadol , Metoprolol , Morphine, and Trazodone  and nefazodone   Social History   Socioeconomic History   Marital status: Married    Spouse name: Not on file   Number of children: Not on file   Years of education: Not on file   Highest education level: Not on file  Occupational History   Not on file  Tobacco Use   Smoking status: Some Days    Types: Cigars  Last attempt to quit: 01/04/2010    Years since quitting: 14.1   Smokeless tobacco: Never   Tobacco comments:    Smokes 1 cigar per day  Vaping Use   Vaping status: Never Used  Substance and Sexual Activity   Alcohol use: No   Drug use: No   Sexual activity: Not on file  Other Topics Concern   Not on file  Social History Narrative   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    Married for 25 years    5 children ( 3 in [place] and 2 in [place])   Social Drivers of Health   Financial Resource Strain: Low Risk  (10/03/2023)   Overall Financial Resource Strain (CARDIA)    Difficulty of Paying Living Expenses: Not hard at all  Food Insecurity:  No Food Insecurity (10/03/2023)   Hunger Vital Sign    Worried About Running Out of Food in the Last Year: Never true    Ran Out of Food in the Last Year: Never true  Transportation Needs: No Transportation Needs (10/03/2023)   PRAPARE - Administrator, Civil Service (Medical): No    Lack of Transportation (Non-Medical): No  Physical Activity: Sufficiently Active (10/03/2023)   Exercise Vital Sign    Days of Exercise per Week: 5 days    Minutes of Exercise per Session: 30 min  Stress: No Stress Concern Present (10/03/2023)   [employer] of Occupational Health - Occupational Stress Questionnaire    Feeling of Stress : Not at all  Social Connections: Moderately Integrated (10/03/2023)   Social Connection and Isolation Panel    Frequency of Communication with Friends and Family: More than three times a week    Frequency of Social Gatherings with Friends and Family: More than three times a week    Attends Religious Services: Never    Database administrator or Organizations: Yes    Attends Engineer, structural: More than 4 times per year    Marital Status: Married     Family History: The patient's family history includes Heart attack (age of onset: 65) in his father; Liver cancer (age of onset: 20) in his mother.  ROS:   Please see the history of present illness.     All other systems reviewed and are negative.  EKGs/Labs/Other Studies Reviewed:    The following studies were reviewed today:   EKG:   07/24/2023: Normal sinus rhythm, rate 90, left axis deviation, Q waves in leads III, aVF and V3/4 02/27/2024: Sinus rhythm with first-degree AV block, left axis deviation, rate 64, poor R wave progression, Q waves in leads III, aVF  Recent Labs: 07/24/2023: Magnesium 2.2 07/26/2023: ALT 22 02/15/2024: BUN 13; Creat 1.13; Hemoglobin 14.6; Platelets 303; Potassium 4.7; Sodium 141  Recent Lipid Panel    Component Value Date/Time   CHOL 244 (H) 08/23/2023 1108    TRIG 127 08/23/2023 1108   TRIG 72 06/21/2006 1046   HDL 43 08/23/2023 1108   CHOLHDL 5.7 (H) 08/23/2023 1108   CHOLHDL 3 01/06/2022 1105   VLDL 18.4 01/06/2022 1105   LDLCALC 178 (H) 08/23/2023 1108   LDLDIRECT 161.7 10/31/2009 0926    Physical Exam:    VS:  BP (!) 160/88 (BP Location: Left Arm, Patient Position: Sitting, Cuff Size: Large)   Pulse 69   Resp 16   Ht 5' 4 (1.626 m)   Wt 230 lb 8 oz (104.6 kg)   SpO2 95%  BMI 39.57 kg/m     Wt Readings from Last 3 Encounters:  02/27/24 230 lb 8 oz (104.6 kg)  02/15/24 236 lb (107 kg)  02/07/24 238 lb (108 kg)     GEN:  Well nourished, well developed in no acute distress HEENT: Normal NECK: No JVD; No carotid bruits LYMPHATICS: No lymphadenopathy CARDIAC: RRR, no murmurs, rubs, gallops RESPIRATORY:  Clear to auscultation without rales, wheezing or rhonchi  ABDOMEN: Soft, non-tender, non-distended MUSCULOSKELETAL:  No edema; No deformity  SKIN: Warm and dry NEUROLOGIC:  Alert and oriented x 3 PSYCHIATRIC:  Normal affect   ASSESSMENT:    1. Pre-op evaluation   2. PAF (paroxysmal atrial fibrillation) (HCC)   3. Snoring   4. Mixed hyperlipidemia   5. Essential hypertension     PLAN:    Preop evaluation: Prior to knee surgery.  Good functional capacity, greater than 4 METS.  Denies any anginal symptoms.  RCRI score 1 given CVA history.  Overall would classify as intermediate risk for intermediate risk surgery. - No further cardiac workup recommended prior to surgery - Hold Eliquis  x 2 days prior to surgery  Paroxysmal atrial fibrillation: Diagnosed on ED visit 02/2023.  CHA2DS2-VASc 3 (age, CVA x 2).  Echocardiogram 02/07/2023 showed EF 60 to 65%, normal RV function, no significant valvular disease. -Continue Eliquis  5 mg twice daily -Continue carvedilol , will increase to 6.25 mg twice daily -Check sleep study  Hypertension: On carvedilol  3.125 mg twice daily.  BP elevated, will increase to 6.25 mg twice daily.   Asked to check BP daily for next 2 weeks and let us  know results  CVA: Admitted 03/2021 with acute CVA.  TEE 03/2021 showed EF 55%, small PFO.  Has a loop recorder.  Found to have atrial fibrillation/flutter as above.  On Eliquis .  Has been unable to tolerate statins, referred to pharmacy lipid clinic and started on Repatha   Hyperlipidemia: Reports intolerance to multiple statins due to myalgias.  LDL 178 on 08/2023.  Referred to pharmacy lipid clinic and started on Repatha .  Update lipid panel  Morbid obesity: Body mass index is 39.57 kg/m.  Diet/exercise encouraged.  Discussed referral to healthy weight and wellness but he declined  Snoring: Check Itamar sleep study.  STOP-BANG 5   RTC in 6 months  Medication Adjustments/Labs and Tests Ordered: Current medicines are reviewed at length with the patient today.  Concerns regarding medicines are outlined above.  Orders Placed This Encounter  Procedures   Lipid panel   EKG 12-Lead   Itamar Sleep Study   No orders of the defined types were placed in this encounter.   Patient Instructions  Medication Instructions:  increase COREG  6.25 MG twice a day continue CURRENT MEDICATIONS *If you need a refill on your cardiac medications before your next appointment, please call your pharmacy*  Lab Work: Lipid panel today If you have labs (blood work) drawn today and your tests are completely normal, you will receive your results only by: MyChart Message (if you have MyChart) OR A paper copy in the mail If you have any lab test that is abnormal or we need to change your treatment, we will call you to review the results.  Testing/Procedures: Itamar  WatchPAT?  Is a FDA cleared portable home sleep study test that uses a watch and 3 points of contact to monitor 7 different channels, including your heart rate, oxygen saturations, body position, snoring, and chest motion.  The study is easy to use from the comfort of your  own home and accurately  detect sleep apnea.  Before bed, you attach the chest sensor, attached the sleep apnea bracelet to your nondominant hand, and attach the finger probe.  After the study, the raw data is downloaded from the watch and scored for apnea events.   For more information: [email or web address]  Patient Testing Instructions:  Do not put battery into the device until bedtime when you are ready to begin the test. Please call the support number if you need assistance after following the instructions below: 24 hour support line- [phone] or ITAMAR support at [phone] (option 2)  Download the Itamar WatchPAT One app through the google play store or App Store  Be sure to turn on or enable access to bluetooth in settlings on your smartphone/ device  Make sure no other bluetooth devices are on and within the vicinity of your smartphone/ device and WatchPAT watch during testing.  Make sure to leave your smart phone/ device plugged in and charging all night.  When ready for bed:  Follow the instructions step by step in the WatchPAT One App to activate the testing device. For additional instructions, including video instruction, visit the WatchPAT One video on Youtube. You can search for WatchPat One within Youtube (video is 4 minutes and 18 seconds) or enter: https://youtube/watch?v=[email or web address]_vbiwxE Please note: You will be prompted to enter a Pin to connect via bluetooth when starting the test. The PIN will be assigned to you when you receive the test.  The device is disposable, but it recommended that you retain the device until you receive a call letting you know the study has been received and the results have been interpreted.  We will let you know if the study did not transmit to us  properly after the test is completed. You do not need to call us  to confirm the receipt of the test.  Please complete the test within 48 hours of receiving PIN.   Frequently Asked Questions:  What is Watch Bruna  one?  A single use fully disposable home sleep apnea testing device and will not need to be returned after completion.  What are the requirements to use WatchPAT one?  The be able to have a successful watchpat one sleep study, you should have your Watch pat one device, your smart phone, watch pat one app, your PIN number and Internet access What type of phone do I need?  You should have a smart phone that uses Android 5.1 and above or any Iphone with IOS 10 and above How can I download the WatchPAT one app?  Based on your device type search for WatchPAT one app either in google play for android devices or APP store for Iphone's Where will I get my PIN for the study?  Your PIN will be provided by your physician's office. It is used for authentication and if you lose/forget your PIN, please reach out to your providers office.  I do not have Internet at home. Can I do WatchPAT one study?  WatchPAT One needs Internet connection throughout the night to be able to transmit the sleep data. You can use your home/local internet or your cellular's data package. However, it is always recommended to use home/local Internet. It is estimated that between 20MB-30MB will be used with each study.However, the application will be looking for space in the phone to start the study.  What happens if I lose internet or bluetooth connection?  During the internet disconnection, your phone will not be able to  transmit the sleep data. All the data, will be stored in your phone. As soon as the internet connection is back on, the phone will being sending the sleep data. During the bluetooth disconnection, WatchPAT one will not be able to to send the sleep data to your phone. Data will be kept in the WatchPAT one until two devices have bluetooth connection back on. As soon as the connection is back on, WatchPAT one will send the sleep data to the phone.  How long do I need to wear the WatchPAT one?  After you start the  study, you should wear the device at least 6 hours.  How far should I keep my phone from the device?  During the night, your phone should be within 15 feet.  What happens if I leave the room for restroom or other reasons?  Leaving the room for any reason will not cause any problem. As soon as your get back to the room, both devices will reconnect and will continue to send the sleep data. Can I use my phone during the sleep study?  Yes, you can use your phone as usual during the study. But it is recommended to put your watchpat one on when you are ready to go to bed.  How will I get my study results?  A soon as you completed your study, your sleep data will be sent to the provider. They will then share the results with you when they are ready.     Follow-Up: At [facility], you and your health needs are our priority.  As part of our continuing mission to provide you with exceptional heart care, our providers are all part of one team.  This team includes your primary Cardiologist (physician) and Advanced Practice Providers or APPs (Physician Assistants and Nurse Practitioners) who all work together to provide you with the care you need, when you need it.  Your next appointment:   6 month(s)  Provider:   Dr. Kate  We recommend signing up for the patient portal called MyChart.  Sign up information is provided on this After Visit Summary.  MyChart is used to connect with patients for Virtual Visits (Telemedicine).  Patients are able to view lab/test results, encounter notes, upcoming appointments, etc.  Non-urgent messages can be sent to your provider as well.   To learn more about what you can do with MyChart, go to [email or web address].   Other Instructions Please check blood pressure once for next 2 weeks and send those reading       Signed, Lonni LITTIE Kate, MD  02/27/2024 5:49 PM    [facility]

## 2024-02-27 ENCOUNTER — Ambulatory Visit: Payer: Medicare PPO | Attending: Cardiology | Admitting: Cardiology

## 2024-02-27 ENCOUNTER — Telehealth: Payer: Self-pay

## 2024-02-27 ENCOUNTER — Encounter: Payer: Self-pay | Admitting: Cardiology

## 2024-02-27 VITALS — BP 160/88 | HR 69 | Resp 16 | Ht 64.0 in | Wt 230.5 lb

## 2024-02-27 DIAGNOSIS — I480 Paroxysmal atrial fibrillation: Secondary | ICD-10-CM

## 2024-02-27 DIAGNOSIS — E782 Mixed hyperlipidemia: Secondary | ICD-10-CM

## 2024-02-27 DIAGNOSIS — Z01818 Encounter for other preprocedural examination: Secondary | ICD-10-CM

## 2024-02-27 DIAGNOSIS — R0683 Snoring: Secondary | ICD-10-CM | POA: Diagnosis not present

## 2024-02-27 DIAGNOSIS — I10 Essential (primary) hypertension: Secondary | ICD-10-CM | POA: Diagnosis not present

## 2024-02-27 DIAGNOSIS — I4891 Unspecified atrial fibrillation: Secondary | ICD-10-CM | POA: Diagnosis not present

## 2024-02-27 NOTE — Telephone Encounter (Signed)
**Note De-Identified Khiry Pasquariello Obfuscation** Ordering provider: Dr Kate Associated diagnoses: Snoring-R06.33  WatchPAT PA obtained on 02/27/2024 by Eleshia Wooley, Avelina HERO, LPN. Authorization: Per the [employer] provider portal: Preauthorization is not required for services provided by nonparticipating health care providers for [place] P[place] members.  Patient notified of PIN (1234) on 02/27/2024 Randle Shatzer Notification Method: MyChart message.  Phone note routed to covering staff for follow-up.

## 2024-02-27 NOTE — Patient Instructions (Addendum)
 Medication Instructions:  increase COREG  6.25 MG twice a day continue CURRENT MEDICATIONS *If you need a refill on your cardiac medications before your next appointment, please call your pharmacy*  Lab Work: Lipid panel today If you have labs (blood work) drawn today and your tests are completely normal, you will receive your results only by: MyChart Message (if you have MyChart) OR A paper copy in the mail If you have any lab test that is abnormal or we need to change your treatment, we will call you to review the results.  Testing/Procedures: Itamar  WatchPAT?  Is a FDA cleared portable home sleep study test that uses a watch and 3 points of contact to monitor 7 different channels, including your heart rate, oxygen saturations, body position, snoring, and chest motion.  The study is easy to use from the comfort of your own home and accurately detect sleep apnea.  Before bed, you attach the chest sensor, attached the sleep apnea bracelet to your nondominant hand, and attach the finger probe.  After the study, the raw data is downloaded from the watch and scored for apnea events.   For more information: [email or web address]  Patient Testing Instructions:  Do not put battery into the device until bedtime when you are ready to begin the test. Please call the support number if you need assistance after following the instructions below: 24 hour support line- [phone] or ITAMAR support at [phone] (option 2)  Download the Itamar WatchPAT One app through the google play store or App Store  Be sure to turn on or enable access to bluetooth in settlings on your smartphone/ device  Make sure no other bluetooth devices are on and within the vicinity of your smartphone/ device and WatchPAT watch during testing.  Make sure to leave your smart phone/ device plugged in and charging all night.  When ready for bed:  Follow the instructions step by step in the WatchPAT One App  to activate the testing device. For additional instructions, including video instruction, visit the WatchPAT One video on Youtube. You can search for WatchPat One within Youtube (video is 4 minutes and 18 seconds) or enter: https://youtube/watch?v=BCce_vbiwxE Please note: You will be prompted to enter a Pin to connect via bluetooth when starting the test. The PIN will be assigned to you when you receive the test.  The device is disposable, but it recommended that you retain the device until you receive a call letting you know the study has been received and the results have been interpreted.  We will let you know if the study did not transmit to us  properly after the test is completed. You do not need to call us  to confirm the receipt of the test.  Please complete the test within 48 hours of receiving PIN.   Frequently Asked Questions:  What is Watch Bruna one?  A single use fully disposable home sleep apnea testing device and will not need to be returned after completion.  What are the requirements to use WatchPAT one?  The be able to have a successful watchpat one sleep study, you should have your Watch pat one device, your smart phone, watch pat one app, your PIN number and Internet access What type of phone do I need?  You should have a smart phone that uses Android 5.1 and above or any Iphone with IOS 10 and above How can I download the WatchPAT one app?  Based on your device type search for WatchPAT one app either in google  play for android devices or APP store for Iphone's Where will I get my PIN for the study?  Your PIN will be provided by your physician's office. It is used for authentication and if you lose/forget your PIN, please reach out to your providers office.  I do not have Internet at home. Can I do WatchPAT one study?  WatchPAT One needs Internet connection throughout the night to be able to transmit the sleep data. You can use your home/local internet or your cellular's data  package. However, it is always recommended to use home/local Internet. It is estimated that between 20MB-30MB will be used with each study.However, the application will be looking for space in the phone to start the study.  What happens if I lose internet or bluetooth connection?  During the internet disconnection, your phone will not be able to transmit the sleep data. All the data, will be stored in your phone. As soon as the internet connection is back on, the phone will being sending the sleep data. During the bluetooth disconnection, WatchPAT one will not be able to to send the sleep data to your phone. Data will be kept in the WatchPAT one until two devices have bluetooth connection back on. As soon as the connection is back on, WatchPAT one will send the sleep data to the phone.  How long do I need to wear the WatchPAT one?  After you start the study, you should wear the device at least 6 hours.  How far should I keep my phone from the device?  During the night, your phone should be within 15 feet.  What happens if I leave the room for restroom or other reasons?  Leaving the room for any reason will not cause any problem. As soon as your get back to the room, both devices will reconnect and will continue to send the sleep data. Can I use my phone during the sleep study?  Yes, you can use your phone as usual during the study. But it is recommended to put your watchpat one on when you are ready to go to bed.  How will I get my study results?  A soon as you completed your study, your sleep data will be sent to the provider. They will then share the results with you when they are ready.     Follow-Up: At [facility], you and your health needs are our priority.  As part of our continuing mission to provide you with exceptional heart care, our providers are all part of one team.  This team includes your primary Cardiologist (physician) and Advanced Practice Providers or APPs  (Physician Assistants and Nurse Practitioners) who all work together to provide you with the care you need, when you need it.  Your next appointment:   6 month(s)  Provider:   Dr. Kate  We recommend signing up for the patient portal called MyChart.  Sign up information is provided on this After Visit Summary.  MyChart is used to connect with patients for Virtual Visits (Telemedicine).  Patients are able to view lab/test results, encounter notes, upcoming appointments, etc.  Non-urgent messages can be sent to your provider as well.   To learn more about what you can do with MyChart, go to [email or web address].   Other Instructions Please check blood pressure once for next 2 weeks and send those reading

## 2024-02-28 ENCOUNTER — Ambulatory Visit: Payer: Self-pay | Admitting: Cardiology

## 2024-02-28 ENCOUNTER — Telehealth: Payer: Self-pay

## 2024-02-28 ENCOUNTER — Encounter (INDEPENDENT_AMBULATORY_CARE_PROVIDER_SITE_OTHER): Payer: Self-pay | Admitting: Cardiology

## 2024-02-28 DIAGNOSIS — G4733 Obstructive sleep apnea (adult) (pediatric): Secondary | ICD-10-CM

## 2024-02-28 LAB — LIPID PANEL
Chol/HDL Ratio: 2.7 ratio (ref 0.0–5.0)
Cholesterol, Total: 120 mg/dL (ref 100–199)
HDL: 45 mg/dL (ref >39)
LDL Chol Calc (NIH): 56 mg/dL (ref 0–99)
Triglycerides: 105 mg/dL (ref 0–149)
VLDL Cholesterol Cal: 19 mg/dL (ref 5–40)

## 2024-02-28 NOTE — Telephone Encounter (Signed)
 Note: On pt 02/27/24 visit there is a Pre-op evaluation note.     Pre-operative Risk Assessment    Patient Name: Cristian Gonzales  DOB: 1956/08/25 MRN: [id number]   Date of last office visit: 02/27/24 LONNI NANAS, MD Date of next office visit: NONE   Request for Surgical Clearance    Procedure:  LEFT TOTAL KNEE ARTHROPLASTY  Date of Surgery:  Clearance 05/14/24                                Surgeon:  WYNONA OZELL CUMMINS, MD Surgeon's Group or Practice Name:  [facility] Phone number:  [phone] Fax number:  [phone]   Type of Clearance Requested:   - Medical  - Pharmacy:  Hold Apixaban  (Eliquis ) 3 DAYS PRIOR   Type of Anesthesia:  Spinal   Additional requests/questions:    Signed, Lucie DELENA Ku   02/28/2024, 2:29 PM

## 2024-02-29 ENCOUNTER — Ambulatory Visit: Attending: Cardiology

## 2024-02-29 DIAGNOSIS — R0683 Snoring: Secondary | ICD-10-CM

## 2024-02-29 NOTE — Procedures (Signed)
 SLEEP STUDY REPORT Patient Information Study Date: 02/28/2024 Patient Name: Cristian Gonzales Patient ID: [id number] Birth Date: May 14, 1956 Age: 68 Gender:  BMI: 40.4 (W=242 lb, H=5' 5'') Stopbang: 5 Referring Physician: Lonni Nanas, MD  TEST DESCRIPTION: Home sleep apnea testing was completed using the WatchPat, a Type 1 device, utilizing  peripheral arterial tonometry (PAT), chest movement, actigraphy, pulse oximetry, pulse rate, body position and snore.  AHI was calculated with apnea and hypopnea using valid sleep time as the denominator. RDI includes apneas,  hypopneas, and RERAs. The data acquired and the scoring of sleep and all associated events were performed in  accordance with the recommended standards and specifications as outlined in the AASM Manual for the Scoring of  Sleep and Associated Events 2.2.0 (2015).  FINDINGS:  1. Moderate Obstructive Sleep Apnea with AHI 16.9/hr.   2. No Central Sleep Apnea with pAHIc 2.6/hr.  3. Oxygen desaturations as low as 82%.  4. Moderate to severe snoring was present. O2 sats were < 88% for 0.9 min.  5. Total sleep time was 6 hrs and 59 min.  6. 16.3% of total sleep time was spent in REM sleep.   7. sleep onset latency at 20 min  8. REM sleep onset latency at 110 min.   9. Total awakenings were 34.  10. Arrhythmia detection: Suggestive of possible brief atrial fibrillation lasting 41 seconds. This is not diagnostic and  further testing with outpatient telemetry monitoring is recommended.  DIAGNOSIS:  Moderate Obstructive Sleep Apnea (G47.33) Possible Atrial Fibrillation  RECOMMENDATIONS: 1. Clinical correlation of these findings is necessary. The decision to treat obstructive sleep apnea (OSA) is usually  based on the presence of apnea symptoms or the presence of associated medical conditions such as Hypertension,  Congestive Heart Failure, Atrial Fibrillation or Obesity. The most common symptoms of OSA are snoring,  gasping for  breath while sleeping, daytime sleepiness and fatigue.  2. Initiating apnea therapy is recommended given the presence of symptoms and/or associated conditions.  Recommend proceeding with one of the following:  a. Auto-CPAP therapy with a pressure range of 5-20cm H2O.  b. An oral appliance (OA) that can be obtained from certain dentists with expertise in sleep medicine. These are  primarily of use in non-obese patients with mild and moderate disease.  c. An ENT consultation which may be useful to look for specific causes of obstruction and possible treatment  options.  d. If patient is intolerant to PAP therapy, consider referral to ENT for evaluation for hypoglossal nerve stimulator.  3. Close follow-up is necessary to ensure success with CPAP or oral appliance therapy for maximum benefit . 4. A follow-up oximetry study on CPAP is recommended to assess the adequacy of therapy and determine the need  for supplemental oxygen or the potential need for Bi-level therapy. An arterial blood gas to determine the adequacy of  baseline ventilation and oxygenation should also be considered. 5. Healthy sleep recommendations include: adequate nightly sleep (normal 7-9 hrs/night), avoidance of caffeine  after  noon and alcohol near bedtime, and maintaining a sleep environment that is cool, dark and quiet. 6. Weight loss for overweight patients is recommended. Even modest amounts of weight loss can significantly  improve the severity of sleep apnea. 7. Snoring recommendations include: weight loss where appropriate, side sleeping, and avoidance of alcohol before  bed. 8. Operation of motor vehicle should not be performed when sleepy.  Signature: Wilbert Bihari, MD; [facility]; Diplomat, American Board of Sleep  Medicine Electronically Signed: 02/29/2024 8:20:08  AM

## 2024-03-01 NOTE — Telephone Encounter (Signed)
 Patient with diagnosis of atrial fibrillation on Eliquis  for anticoagulation.    Procedure:  LEFT TOTAL KNEE ARTHROPLASTY   Date of Surgery:  Clearance 05/14/24     CHA2DS2-VASc Score = 4   This indicates a 4.8% annual risk of stroke. The patient's score is based upon: CHF History: 0 HTN History: 1 Diabetes History: 0 Stroke History: 2 Vascular Disease History: 0 Age Score: 1 Gender Score: 0   Chart indicates CVA 7/22  CrCl 93  Platelet count 303  Patient has not had an Afib/aflutter ablation within the last 3 months or DCCV within the last 30 days  Per office protocol, patient can hold Eliquis  for 3 days prior to procedure.   Patient will not need bridging with Lovenox  (enoxaparin ) around procedure.  **This guidance is not considered finalized until pre-operative APP has relayed final recommendations.**

## 2024-03-02 ENCOUNTER — Telehealth: Payer: Self-pay

## 2024-03-02 NOTE — Telephone Encounter (Signed)
-----   Message from Wilbert Bihari sent at 02/29/2024  8:21 AM EDT ----- Please let patient know that they have sleep apnea.  Recommend therapeutic CPAP titration for treatment of patient's sleep disordered breathing.

## 2024-03-02 NOTE — Telephone Encounter (Signed)
   Patient Name: Cristian Gonzales  DOB: [birth year] MRN: [id number]  Primary Cardiologist: Powell FORBES Sorrow, MD (Inactive)  Chart reviewed as part of pre-operative protocol coverage. Given past medical history and time since last visit, based on ACC/AHA guidelines, EANN CLELAND is at acceptable risk for the planned procedure without further cardiovascular testing.   Per Dr.Schumann 02/28/2024 Preop evaluation: Prior to knee surgery.  Good functional capacity, greater than 4 METS.  Denies any anginal symptoms.  RCRI score 1 given CVA history.  Overall would classify as intermediate risk for intermediate risk surgery. - No further cardiac workup recommended prior to surgery - Hold Eliquis  x 2 days prior to surgery   Per Pharmacy CHA2DS2-VASc Score = 4   This indicates a 4.8% annual risk of stroke. The patient's score is based upon: CHF History: 0 HTN History: 1 Diabetes History: 0 Stroke History: 2 Vascular Disease History: 0 Age Score: 1 Gender Score: 0   Chart indicates CVA 7/22   CrCl 93             Platelet count 303   Patient has not had an Afib/aflutter ablation within the last 3 months or DCCV within the last 30 days   Per office protocol, patient can hold Eliquis  for 3 days prior to procedure.   Patient will not need bridging with Lovenox  (enoxaparin ) around procedure.  The patient was advised that if he develops new symptoms prior to surgery to contact our office to arrange for a follow-up visit, and he verbalized understanding.  I will route this recommendation to the requesting party via Epic fax function and remove from pre-op pool.  Please call with questions.  Lamarr Satterfield, NP 03/02/2024, 9:34 AM

## 2024-03-02 NOTE — Telephone Encounter (Signed)
 Left VM with callback umber for patient to receive sleep study results and recommendations.

## 2024-03-05 ENCOUNTER — Encounter

## 2024-03-16 ENCOUNTER — Ambulatory Visit: Payer: Self-pay

## 2024-03-16 ENCOUNTER — Encounter: Payer: Self-pay | Admitting: Family Medicine

## 2024-03-16 ENCOUNTER — Ambulatory Visit (INDEPENDENT_AMBULATORY_CARE_PROVIDER_SITE_OTHER): Admitting: Family Medicine

## 2024-03-16 VITALS — BP 142/76 | HR 71 | Temp 98.2°F | Wt 231.0 lb

## 2024-03-16 DIAGNOSIS — J069 Acute upper respiratory infection, unspecified: Secondary | ICD-10-CM | POA: Diagnosis not present

## 2024-03-16 DIAGNOSIS — J029 Acute pharyngitis, unspecified: Secondary | ICD-10-CM | POA: Diagnosis not present

## 2024-03-16 LAB — POC COVID19 BINAXNOW: SARS Coronavirus 2 Ag: NEGATIVE

## 2024-03-16 MED ORDER — HYDROCODONE BIT-HOMATROP MBR 5-1.5 MG/5ML PO SOLN
5.0000 mL | Freq: Four times a day (QID) | ORAL | 0 refills | Status: DC | PRN
Start: 2024-03-16 — End: 2024-05-04

## 2024-03-16 MED ORDER — CARVEDILOL 6.25 MG PO TABS: 6.2500 mg | ORAL_TABLET | Freq: Two times a day (BID) | ORAL | 5 refills | Status: DC
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
## 2024-03-16 NOTE — Telephone Encounter (Signed)
 FYI Only or Action Required?: FYI only for provider.  Patient was last seen in primary care on 02/15/2024 by Merna Huxley, NP.  Called Nurse Triage reporting Cough.  Symptoms began x 5 days.  Interventions attempted: Nothing.  Symptoms are: gradually worsening.  Triage Disposition: See Physician Within 24 Hours  Patient/caregiver understands and will follow disposition?: Yes    Copied from CRM [id number]. Topic: Clinical - Red Word Triage >> Mar 16, 2024  8:10 AM Deleta RAMAN wrote: Red Word that prompted transfer to Nurse Triage: Patient has high fever, diarrhea, and cough. Patient has been having symp for roughly 5 days. Reason for Disposition  Fever present > 3 days (72 hours)  [1] MILD diarrhea (e.g., 1-3 or more stools than normal in past 24 hours) AND [2] present >  7 days  (Exception: Chronic diarrhea that is not worse.)  Answer Assessment - Initial Assessment Questions 1. ONSET: When did the cough begin?      X 5 days 2. SEVERITY: How bad is the cough today?      Moderate  3. SPUTUM: Describe the color of your sputum (e.g., none, dry cough; clear, white, yellow, green)     none 4. HEMOPTYSIS: Are you coughing up any blood? If Yes, ask: How much? (e.g., flecks, streaks, tablespoons, etc.)     no 5. DIFFICULTY BREATHING: Are you having difficulty breathing? If Yes, ask: How bad is it? (e.g., mild, moderate, severe)      no 6. FEVER: Do you have a fever? If Yes, ask: What is your temperature, how was it measured, and when did it start?     Yesterday 101, today 98 7. CARDIAC HISTORY: Do you have any history of heart disease? (e.g., heart attack, congestive heart failure)  stroke 8. LUNG HISTORY: Do you have any history of lung disease?  (e.g., pulmonary embolus, asthma, emphysema)     na 9. PE RISK FACTORS: Do you have a history of blood clots? (or: recent major surgery, recent prolonged travel, bedridden)     na 10. OTHER SYMPTOMS: Do you have any  other symptoms? (e.g., runny nose, wheezing, chest pain)       Runny nose, sneezing 11. PREGNANCY: Is there any chance you are pregnant? When was your last menstrual period?       na 12. TRAVEL: Have you traveled out of the country in the last month? (e.g., travel history, exposures)       na  Answer Assessment - Initial Assessment Questions 1. DIARRHEA SEVERITY: How bad is the diarrhea? How many more stools have you had in the past 24 hours than normal?      several 2. ONSET: When did the diarrhea begin?      X 3 days 3. STOOL DESCRIPTION:  How loose or watery is the diarrhea? What is the stool color? Is there any blood or mucous in the stool?     Watery, loose 4. VOMITING: Are you also vomiting? If Yes, ask: How many times in the past 24 hours?      no 5. ABDOMEN PAIN: Are you having any abdomen pain? If Yes, ask: What does it feel like? (e.g., crampy, dull, intermittent, constant)      no 6. ABDOMEN PAIN SEVERITY: If present, ask: How bad is the pain?  (e.g., Scale 1-10; mild, moderate, or severe)      7. ORAL INTAKE: If vomiting, Have you been able to drink liquids? How much liquids have you had in the past  24 hours?     yes 8. HYDRATION: Any signs of dehydration? (e.g., dry mouth [not just dry lips], too weak to stand, dizziness, new weight loss) When did you last urinate?     na 9. EXPOSURE: Have you traveled to a foreign country recently? Have you been exposed to anyone with diarrhea? Could you have eaten any food that was spoiled?     na 10. ANTIBIOTIC USE: Are you taking antibiotics now or have you taken antibiotics in the past 2 months?       na 11. OTHER SYMPTOMS: Do you have any other symptoms? (e.g., fever, blood in stool)       fever 12. PREGNANCY: Is there any chance you are pregnant? When was your last menstrual period?       na  Protocols used: Cough - Acute Non-Productive-A-AH, Diarrhea-A-AH

## 2024-03-18 ENCOUNTER — Encounter (HOSPITAL_COMMUNITY): Payer: Self-pay

## 2024-03-18 ENCOUNTER — Emergency Department (HOSPITAL_COMMUNITY)

## 2024-03-18 ENCOUNTER — Emergency Department (HOSPITAL_COMMUNITY): Admission: EM | Admit: 2024-03-18 | Discharge: 2024-03-18 | Disposition: A | Discharge status: Home / Self Care

## 2024-03-18 ENCOUNTER — Other Ambulatory Visit: Payer: Self-pay

## 2024-03-18 DIAGNOSIS — R0789 Other chest pain: Secondary | ICD-10-CM | POA: Diagnosis not present

## 2024-03-18 DIAGNOSIS — I700 Atherosclerosis of aorta: Secondary | ICD-10-CM | POA: Diagnosis not present

## 2024-03-18 DIAGNOSIS — J40 Bronchitis, not specified as acute or chronic: Secondary | ICD-10-CM | POA: Diagnosis not present

## 2024-03-18 DIAGNOSIS — F17210 Nicotine dependence, cigarettes, uncomplicated: Secondary | ICD-10-CM | POA: Diagnosis not present

## 2024-03-18 DIAGNOSIS — Z7901 Long term (current) use of anticoagulants: Secondary | ICD-10-CM | POA: Insufficient documentation

## 2024-03-18 DIAGNOSIS — R911 Solitary pulmonary nodule: Secondary | ICD-10-CM | POA: Insufficient documentation

## 2024-03-18 DIAGNOSIS — R042 Hemoptysis: Secondary | ICD-10-CM | POA: Diagnosis not present

## 2024-03-18 DIAGNOSIS — R059 Cough, unspecified: Secondary | ICD-10-CM | POA: Diagnosis present

## 2024-03-18 DIAGNOSIS — R918 Other nonspecific abnormal finding of lung field: Secondary | ICD-10-CM | POA: Diagnosis not present

## 2024-03-18 LAB — CBC WITH DIFFERENTIAL/PLATELET
Abs Immature Granulocytes: 0.04 K/uL (ref 0.00–0.07)
Basophils Absolute: 0.1 K/uL (ref 0.0–0.1)
Basophils Relative: 1 %
Eosinophils Absolute: 0.1 K/uL (ref 0.0–0.5)
Eosinophils Relative: 2 %
HCT: 43.8 % (ref 39.0–52.0)
Hemoglobin: 14.5 g/dL (ref 13.0–17.0)
Immature Granulocytes: 1 %
Lymphocytes Relative: 18 %
Lymphs Abs: 1.2 K/uL (ref 0.7–4.0)
MCH: 30 pg (ref 26.0–34.0)
MCHC: 33.1 g/dL (ref 30.0–36.0)
MCV: 90.7 fL (ref 80.0–100.0)
Monocytes Absolute: 0.7 K/uL (ref 0.1–1.0)
Monocytes Relative: 10 %
Neutro Abs: 4.7 K/uL (ref 1.7–7.7)
Neutrophils Relative %: 68 %
Platelets: 227 K/uL (ref 150–400)
RBC: 4.83 MIL/uL (ref 4.22–5.81)
RDW: 13.9 % (ref 11.5–15.5)
WBC: 6.8 K/uL (ref 4.0–10.5)
nRBC: 0 % (ref 0.0–0.2)

## 2024-03-18 LAB — BASIC METABOLIC PANEL WITH GFR
Anion gap: 10 (ref 5–15)
BUN: 12 mg/dL (ref 8–23)
CO2: 24 mmol/L (ref 22–32)
Calcium: 8.7 mg/dL — ABNORMAL LOW (ref 8.9–10.3)
Chloride: 102 mmol/L (ref 98–111)
Creatinine, Ser: 1.1 mg/dL (ref 0.61–1.24)
GFR, Estimated: >60 mL/min (ref >60)
Glucose, Bld: 93 mg/dL (ref 70–99)
Potassium: 3.8 mmol/L (ref 3.5–5.1)
Sodium: 136 mmol/L (ref 135–145)

## 2024-03-18 LAB — TROPONIN I (HIGH SENSITIVITY): Troponin I (High Sensitivity): 6 ng/L (ref <18)

## 2024-03-18 MED ORDER — SODIUM CHLORIDE 0.9 % IV SOLN
500.0000 mg | Freq: Once | INTRAVENOUS | Status: AC
  Administered 2024-03-18: 500 mg via INTRAVENOUS
  Filled 2024-03-18: qty 5

## 2024-03-18 MED ORDER — ALBUTEROL SULFATE HFA 108 (90 BASE) MCG/ACT IN AERS: 2.0000 | INHALATION_SPRAY | RESPIRATORY_TRACT | 0 refills | Status: DC | PRN

## 2024-03-18 MED ORDER — PREDNISONE 50 MG PO TABS: ORAL_TABLET | ORAL | 0 refills | Status: DC

## 2024-03-18 MED ORDER — IPRATROPIUM-ALBUTEROL 0.5-2.5 (3) MG/3ML IN SOLN
3.0000 mL | Freq: Once | RESPIRATORY_TRACT | Status: AC
  Administered 2024-03-18: 3 mL via RESPIRATORY_TRACT
  Filled 2024-03-18: qty 3

## 2024-03-18 MED ORDER — AZITHROMYCIN 250 MG PO TABS: 250.0000 mg | ORAL_TABLET | Freq: Every day | ORAL | 0 refills | Status: DC

## 2024-03-18 MED ORDER — METHYLPREDNISOLONE SODIUM SUCC 125 MG IJ SOLR
125.0000 mg | Freq: Once | INTRAMUSCULAR | Status: AC
  Administered 2024-03-18: 125 mg via INTRAVENOUS
  Filled 2024-03-18: qty 2

## 2024-03-18 MED ORDER — IOHEXOL 350 MG/ML SOLN
75.0000 mL | Freq: Once | INTRAVENOUS | Status: AC | PRN
  Administered 2024-03-18: 75 mL via INTRAVENOUS

## 2024-03-18 NOTE — Discharge Instructions (Signed)
 Please take the prednisone  as prescribed and use 2 puffs of the albuterol  every 4 hours as needed for wheezing or shortness of breath.  If you are having minimal bleeding while coughing and or not having severe shortness of breath it is okay to wait and follow-up with your primary care doctor.  If you start coughing up a large amount of blood or are feeling more short of breath please return to the emergency department for reevaluation.  Please follow-up with your doctor regarding the pulmonary nodule.  They will need to perform further testing in 3 months.

## 2024-03-18 NOTE — ED Notes (Signed)
 Pt's O2 maintained at 98% while ambulating without feeling short of breath. MD made aware.

## 2024-03-18 NOTE — ED Triage Notes (Signed)
 Pt c.o cough x 2 days but this morning he noticed bright red blood in his sputum. Pt takes Eliquis . Denies chest pain

## 2024-03-18 NOTE — ED Provider Triage Note (Signed)
 Emergency Medicine Provider Triage Evaluation Note  Cristian Gonzales , a 68 y.o. male  was evaluated in triage.  Pt complains of hemoptysis.  Started this morning.  Several episodes.  Denies chest discomfort.  He is on Eliquis .  Outside of missing his dose this morning he has not missed any other doses.  Review of Systems  Positive: As above Negative: As above  Physical Exam  BP (!) 144/83 (BP Location: Right Arm)   Pulse 77   Temp 99.9 F (37.7 C)   Resp 17   SpO2 94%  Gen:   Awake, no distress   Resp:  Normal effort  MSK:   Moves extremities without difficulty  Other:    Medical Decision Making  Medically screening exam initiated at 12:57 PM.  Appropriate orders placed.  Cristian Gonzales was informed that the remainder of the evaluation will be completed by another provider, this initial triage assessment does not replace that evaluation, and the importance of remaining in the ED until their evaluation is complete.    Cristian Loge, PA-C 03/18/24 [id number]

## 2024-03-18 NOTE — ED Provider Notes (Signed)
 [place] EMERGENCY DEPARTMENT AT [facility] Provider Note   CSN: [id number] Arrival date & time: 03/18/24  1241     Patient presents with: Hemoptysis   Cristian Gonzales is a 68 y.o. male.   68 year old male with past medical history of daily cigar smoking as well as CVA in the past and atrial fibrillation on Eliquis  presenting to the emergency department today with concern for hemoptysis.  The patient states that he has had a cough for the past few days.  Reports that this is minimally productive but states that he has noticed today that he had a few episodes of some blood-tinged sputum.  He is having some chest discomfort in the center of his chest with coughing and deep breathing.  He also reports some nasal congestion and sore throat.  He states that he saw his primary care doctor few days ago and had COVID and flu testing which were negative.  Was given Decadron  with no improvement.  He came to the emergency department today due to these ongoing symptoms and the hemoptysis.        Prior to Admission medications   Medication Sig Start Date End Date Taking? Authorizing Provider  apixaban  (ELIQUIS ) 5 MG TABS tablet Take 5 mg by mouth 2 (two) times daily.    [provider]  buPROPion  (WELLBUTRIN  XL) 150 MG 24 hr tablet TAKE 1 TABLET BY MOUTH EVERY DAY 08/30/23   Nafziger, Darleene, NP  carvedilol  (COREG ) 6.25 MG tablet Take 1 tablet (6.25 mg total) by mouth 2 (two) times daily with a meal. 03/16/24   Burchette, Wolm ORN, MD  Evolocumab  (REPATHA  SURECLICK) 140 MG/ML SOAJ Inject 140 mg into the skin every 14 (fourteen) days. 10/05/23   Kate Lonni CROME, MD  HYDROcodone  bit-homatropine (HYCODAN) 5-1.5 MG/5ML syrup Take 5 mLs by mouth every 6 (six) hours as needed. 03/16/24   Burchette, Wolm ORN, MD    Allergies: Tramadol , Metoprolol , Morphine, and Trazodone  and nefazodone    Review of Systems  Respiratory:  Positive for cough.   All other systems reviewed and are  negative.   Updated Vital Signs BP (!) 146/77   Pulse 66   Temp 99.9 F (37.7 C)   Resp 10   SpO2 99%   Physical Exam Vitals and nursing note reviewed.   Gen: NAD Eyes: PERRL, EOMI HEENT: no oropharyngeal swelling Neck: trachea midline Resp: Faint wheezes noted throughout all lung fields and bronchospastic cough noted Card: RRR, no murmurs, rubs, or gallops Abd: nontender, nondistended Extremities: no calf tenderness, no edema Vascular: 2+ radial pulses bilaterally, 2+ DP pulses bilaterally Skin: no rashes Psyc: acting appropriately   (all labs ordered are listed, but only abnormal results are displayed) Labs Reviewed  BASIC METABOLIC PANEL WITH GFR - Abnormal; Notable for the following components:      Result Value   Calcium  8.7 (*)    All other components within normal limits  CBC WITH DIFFERENTIAL/PLATELET  TROPONIN I (HIGH SENSITIVITY)  TROPONIN I (HIGH SENSITIVITY)    EKG: EKG Interpretation Date/Time:  Sunday March 18 2024 13:00:17 EDT Ventricular Rate:  79 PR Interval:  184 QRS Duration:  76 QT Interval:  334 QTC Calculation: 382 R Axis:   -46  Text Interpretation: Normal sinus rhythm Left axis deviation Low voltage QRS Cannot rule out Anterior infarct , age undetermined Abnormal ECG When compared with ECG of 27-Feb-2024 09:35, PREVIOUS ECG IS PRESENT Confirmed by Ula Barter [phone]) on 03/18/2024 3:36:10 PM  Radiology: CT  Angio Chest PE W and/or Wo Contrast Result Date: 03/18/2024 CLINICAL DATA:  High probability for PE.  Hemoptysis. EXAM: CT ANGIOGRAPHY CHEST WITH CONTRAST TECHNIQUE: Multidetector CT imaging of the chest was performed using the standard protocol during bolus administration of intravenous contrast. Multiplanar CT image reconstructions and MIPs were obtained to evaluate the vascular anatomy. RADIATION DOSE REDUCTION: This exam was performed according to the departmental dose-optimization program which includes automated exposure control,  adjustment of the mA and/or kV according to patient size and/or use of iterative reconstruction technique. CONTRAST:  75mL OMNIPAQUE  IOHEXOL  350 MG/ML SOLN COMPARISON:  CT of the chest 02/06/2023 FINDINGS: Cardiovascular: Satisfactory opacification of the pulmonary arteries to the segmental level. No evidence of pulmonary embolism. Normal heart size. No pericardial effusion. There is mild calcified atherosclerotic disease of the aorta. Mediastinum/Nodes: No enlarged mediastinal, hilar, or axillary lymph nodes. Thyroid  gland, trachea, and esophagus demonstrate no significant findings. Lungs/Pleura: There is central peribronchial wall thickening. There is no focal lung infiltrate pleural effusion or pneumothorax. There is a stable air 9 mm pulmonary nodule in the right lower lobe image 7/83. No new pulmonary nodules are identified. Upper Abdomen: No acute abnormality. Musculoskeletal: No acute fractures are seen. Cervical spinal fusion plate is present. Degenerative changes affect the spine. Review of the MIP images confirms the above findings. IMPRESSION: 1. No evidence for pulmonary embolism. 2. Central peribronchial wall thickening compatible with bronchitis. 3. Stable 9 mm pulmonary nodule in the right lower lobe demonstrating 1 year stability, indeterminate. Consider one of the following in 3 months for both low-risk and high-risk individuals: (a) repeat chest CT, (b) follow-up PET-CT, or (c) tissue sampling. This recommendation follows the consensus statement: Guidelines for Management of Incidental Pulmonary Nodules Detected on CT Images: From the Fleischner Society 2017; Radiology 2017; 284:228-243. Aortic Atherosclerosis (ICD10-I70.0). Electronically Signed   By: Greig Pique M.D.   On: 03/18/2024 15:27   DG Chest 2 View Result Date: 03/18/2024 CLINICAL DATA:  Hemoptysis EXAM: CHEST - 2 VIEW COMPARISON:  July 25, 2023 FINDINGS: The cardiomediastinal silhouette is unchanged in contour.Cardiac loop  recorder. No pleural effusion. No pneumothorax. No acute pleuroparenchymal abnormality. IMPRESSION: No acute cardiopulmonary abnormality. Electronically Signed   By: Corean Salter M.D.   On: 03/18/2024 14:31     Procedures   Medications Ordered in the ED  ipratropium-albuterol  (DUONEB) 0.5-2.5 (3) MG/3ML nebulizer solution 3 mL (has no administration in time range)  methylPREDNISolone  sodium succinate (SOLU-MEDROL ) 125 mg/2 mL injection 125 mg (has no administration in time range)  azithromycin  (ZITHROMAX ) 500 mg in sodium chloride  0.9 % 250 mL IVPB (has no administration in time range)  iohexol  (OMNIPAQUE ) 350 MG/ML injection 75 mL (75 mLs Intravenous Contrast Given 03/18/24 1503)                                    Medical Decision Making 68 year old male presenting to the emergency department today with low volume hemoptysis in the setting of cough and congestion over the past few days.  His initial x-ray and labs from triage are unremarkable.  CT angiogram is ordered to evaluate for pulmonary embolism.  The patient is wheezing on exam.  Will give him a DuoNeb here as well as Solu-Medrol  and azithromycin  as he does have history of tobacco abuse that this may be due to COPD exacerbation.  The patient CT scan does not show any pulmonary embolism but does show findings consistent  with bronchitis.  There is a pulmonary nodule as well that is stable.  We will obtain an ambulatory pulse ox after the patient's treatments and reevaluate for ultimate disposition.  If he is not hypoxic I think that he is stable for discharge as he has not had any large-volume hemoptysis on history and has not had any further hemoptysis here in the emergency department.  The patient CT scan shows bronchitis.  He does have a pulmonary nodule which was discussed with him and he does have a primary care doctor to follow-up with regarding this.  His ambulatory pulse ox was in the high 90s.  He is discharged with return  precautions.  Risk Prescription drug management.        Final diagnoses:  Bronchitis  Pulmonary nodule    ED Discharge Orders     None          Ula Prentice SAUNDERS, MD 03/18/24 1627

## 2024-03-21 ENCOUNTER — Telehealth: Payer: Self-pay

## 2024-03-21 NOTE — Telephone Encounter (Signed)
 Lm to call our office back at [phone] we are trying to reach you concerning his sleep study result.

## 2024-03-21 NOTE — Transitions of Care (Post Inpatient/ED Visit) (Signed)
   03/21/2024  Name: Cristian Gonzales MRN: [id number] DOB: July 09, 1956  Today's TOC FU Call Status: Today's TOC FU Call Status:: Unsuccessful Call (1st Attempt) Unsuccessful Call (1st Attempt) Date: 03/21/24  Attempted to reach the patient regarding the most recent Inpatient/ED visit.  Follow Up Plan: Additional outreach attempts will be made to reach the patient to complete the Transitions of Care (Post Inpatient/ED visit) call.   Signature [employer]

## 2024-03-22 ENCOUNTER — Encounter: Payer: Self-pay | Admitting: Adult Health

## 2024-03-22 ENCOUNTER — Ambulatory Visit (INDEPENDENT_AMBULATORY_CARE_PROVIDER_SITE_OTHER): Admitting: Adult Health

## 2024-03-22 VITALS — BP 118/80 | HR 61 | Temp 98.2°F | Ht 64.0 in | Wt 226.0 lb

## 2024-03-22 DIAGNOSIS — J40 Bronchitis, not specified as acute or chronic: Secondary | ICD-10-CM

## 2024-03-22 DIAGNOSIS — R911 Solitary pulmonary nodule: Secondary | ICD-10-CM
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
## 2024-03-25 ENCOUNTER — Other Ambulatory Visit: Payer: Self-pay | Admitting: Cardiology

## 2024-03-25 DIAGNOSIS — I63412 Cerebral infarction due to embolism of left middle cerebral artery: Secondary | ICD-10-CM

## 2024-03-25 DIAGNOSIS — E782 Mixed hyperlipidemia: Secondary | ICD-10-CM

## 2024-04-05 ENCOUNTER — Encounter

## 2024-04-19 ENCOUNTER — Telehealth: Payer: Self-pay | Admitting: Physician Assistant

## 2024-04-19 NOTE — Telephone Encounter (Signed)
 Called and left pt 1X vm for pt to call and reschedule post op appt. Provider not in office

## 2024-05-07 ENCOUNTER — Encounter

## 2024-05-08 ENCOUNTER — Encounter (HOSPITAL_COMMUNITY): Payer: Self-pay

## 2024-05-08 NOTE — Progress Notes (Signed)
 Surgical Instructions   Your procedure is scheduled on Monday, September 8th, 2025. Report to [facility] Main Entrance A at 7:30 A.M., then check in with the Admitting office. Any questions or running late day of surgery: call [phone]  Questions prior to your surgery date: call [phone], Monday-Friday, 8am-4pm. If you experience any cold or flu symptoms such as cough, fever, chills, shortness of breath, etc. between now and your scheduled surgery, please notify us  at the above number.     Remember:  Do not eat after midnight the night before your surgery  You may drink clear liquids until 7:00 the morning of your surgery.   Clear liquids allowed are: Water, Non-Citrus Juices (without pulp), Carbonated Beverages, Clear Tea (no milk, honey, etc.), Black Coffee Only (NO MILK, CREAM OR POWDERED CREAMER of any kind), and Gatorade.  Patient Instructions  The night before surgery:  No food after midnight. ONLY clear liquids after midnight  The day of surgery (if you do NOT have diabetes):  Drink ONE (1) Pre-Surgery Clear Ensure by 7:00 the morning of surgery. Drink in one sitting. Do not sip.  This drink was given to you during your hospital  pre-op appointment visit.  Nothing else to drink after completing the  Pre-Surgery Clear Ensure.          If you have questions, please contact your surgeon's office.     Take these medicines the morning of surgery with A SIP OF WATER: Bupropion  (Wellbutrin ) Carvedilol  (Coreg )   May take these medicines IF NEEDED: None.   Per your cardiologist, Apixaban  (Eliquis ) should be stopped 2 days prior to your procedure.  Your last dose of Apixaban  (Eliquis ) should be on Friday, September 5th.      One week prior to surgery, STOP taking any Aspirin  (unless otherwise instructed by your surgeon) Aleve , Naproxen , Ibuprofen , Motrin , Advil , Goody's, BC's, all herbal medications, fish oil, and non-prescription vitamins.                      Do NOT Smoke (Tobacco/Vaping) for 24 hours prior to your procedure.  If you use a CPAP at night, you may bring your mask/headgear for your overnight stay.   You will be asked to remove any contacts, glasses, piercing's, hearing aid's, dentures/partials prior to surgery. Please bring cases for these items if needed.    Patients discharged the day of surgery will not be allowed to drive home, and someone needs to stay with them for 24 hours.  SURGICAL WAITING ROOM VISITATION Patients may have no more than 2 support people in the waiting area - these visitors may rotate.   Pre-op nurse will coordinate an appropriate time for 1 ADULT support person, who may not rotate, to accompany patient in pre-op.  Children under the age of 24 must have an adult with them who is not the patient and must remain in the main waiting area with an adult.  If the patient needs to stay at the hospital during part of their recovery, the visitor guidelines for inpatient rooms apply.  Please refer to the [facility] website for the visitor guidelines for any additional information.   If you received a COVID test during your pre-op visit  it is requested that you wear a mask when out in public, stay away from anyone that may not be feeling well and notify your surgeon if you develop symptoms. If you have been in contact with anyone that has tested positive in the  last 10 days please notify you surgeon.      Pre-operative 5 CHG Bathing Instructions   You can play a key role in reducing the risk of infection after surgery. Your skin needs to be as free of germs as possible. You can reduce the number of germs on your skin by washing with CHG (chlorhexidine  gluconate) soap before surgery. CHG is an antiseptic soap that kills germs and continues to kill germs even after washing.   DO NOT use if you have an allergy to chlorhexidine /CHG or antibacterial soaps. If your skin becomes reddened or irritated, stop using the CHG  and notify one of our RNs at [phone].   Please shower with the CHG soap starting 4 days before surgery using the following schedule:     Please keep in mind the following:  DO NOT shave, including legs and underarms, starting the day of your first shower.   You may shave your face at any point before/day of surgery.  Place clean sheets on your bed the day you start using CHG soap. Use a clean washcloth (not used since being washed) for each shower. DO NOT sleep with pets once you start using the CHG.   CHG Shower Instructions:  Wash your face and private area with normal soap. If you choose to wash your hair, wash first with your normal shampoo.  After you use shampoo/soap, rinse your hair and body thoroughly to remove shampoo/soap residue.  Turn the water OFF and apply about 3 tablespoons (45 ml) of CHG soap to a CLEAN washcloth.  Apply CHG soap ONLY FROM YOUR NECK DOWN TO YOUR TOES (washing for 3-5 minutes)  DO NOT use CHG soap on face, private areas, open wounds, or sores.  Pay special attention to the area where your surgery is being performed.  If you are having back surgery, having someone wash your back for you may be helpful. Wait 2 minutes after CHG soap is applied, then you may rinse off the CHG soap.  Pat dry with a clean towel  Put on clean clothes/pajamas   If you choose to wear lotion, please use ONLY the CHG-compatible lotions that are listed below.  Additional instructions for the day of surgery: DO NOT APPLY any lotions, deodorants, cologne, or perfumes.   Do not bring valuables to the hospital. [facility] is not responsible for any belongings/valuables. Do not wear nail polish, gel polish, artificial nails, or any other type of covering on natural nails (fingers and toes) Do not wear jewelry or makeup Put on clean/comfortable clothes.  Please brush your teeth.  Ask your nurse before applying any prescription medications to the skin.     CHG Compatible  Lotions   Aveeno Moisturizing lotion  Cetaphil Moisturizing Cream  Cetaphil Moisturizing Lotion  Clairol Herbal Essence Moisturizing Lotion, Dry Skin  Clairol Herbal Essence Moisturizing Lotion, Extra Dry Skin  Clairol Herbal Essence Moisturizing Lotion, Normal Skin  Curel Age Defying Therapeutic Moisturizing Lotion with Alpha Hydroxy  Curel Extreme Care Body Lotion  Curel Soothing Hands Moisturizing Hand Lotion  Curel Therapeutic Moisturizing Cream, Fragrance-Free  Curel Therapeutic Moisturizing Lotion, Fragrance-Free  Curel Therapeutic Moisturizing Lotion, Original Formula  Eucerin Daily Replenishing Lotion  Eucerin Dry Skin Therapy Plus Alpha Hydroxy Crme  Eucerin Dry Skin Therapy Plus Alpha Hydroxy Lotion  Eucerin Original Crme  Eucerin Original Lotion  Eucerin Plus Crme Eucerin Plus Lotion  Eucerin TriLipid Replenishing Lotion  Keri Anti-Bacterial Hand Lotion  Keri Deep Conditioning Original Lotion Dry Skin  Formula Softly Scented  Keri Deep Conditioning Original Lotion, Fragrance Free Sensitive Skin Formula  Keri Lotion Fast Absorbing Fragrance Free Sensitive Skin Formula  Keri Lotion Fast Absorbing Softly Scented Dry Skin Formula  Keri Original Lotion  Keri Skin Renewal Lotion Keri Silky Smooth Lotion  Keri Silky Smooth Sensitive Skin Lotion  Nivea Body Creamy Conditioning Oil  Nivea Body Extra Enriched Lotion  Nivea Body Original Lotion  Nivea Body Sheer Moisturizing Lotion Nivea Crme  Nivea Skin Firming Lotion  NutraDerm 30 Skin Lotion  NutraDerm Skin Lotion  NutraDerm Therapeutic Skin Cream  NutraDerm Therapeutic Skin Lotion  ProShield Protective Hand Cream  Provon moisturizing lotion  Please read over the following fact sheets that you were given.

## 2024-05-08 NOTE — Progress Notes (Signed)
 PCP - [name], NP Cardiologist - Dr Kate (Clearance from Lamarr Satterfield, NP on 03/02/24)  Chest x-ray - 03/18/24 EKG - 03/18/24 Stress Test - years ago, test was negative per pt. ECHO - 02/07/23 Cardiac Cath - n/a  Loop - Loop recorder was inserted on 03/19/21.  Last remote device check on 12/25/23 was normal.  Sleep Study -  Yes (02/29/24) CPAP- does not need CPAP per pt.  Diabetes - n/a  Eliquis  Instructions:  Hold for 2 days prior to procedure.  Last dose will be on Friday, 05/11/24..  Aspirin  Instructions: n/a  ERAS - clear liquids til 7 AM DOS. PRE-SURGERY Ensure given with instructions.   Anesthesia review: Yes  STOP now taking any Aspirin  (unless otherwise instructed by your surgeon), Aleve , Naproxen , Ibuprofen , Motrin , Advil , Goody's, BC's, all herbal medications, fish oil, and all vitamins.   Coronavirus Screening Do you have any of the following symptoms:  Cough yes/no: No Fever (>100.63F)  yes/no: No Runny nose yes/no: No Sore throat yes/no: No Difficulty breathing/shortness of breath  yes/no: No  Have you traveled in the last 14 days and where? yes/no: No  Patient verbalized understanding of instructions that were given to them at the PAT appointment. Patient was also instructed that they will need to review over the PAT instructions again at home before surgery.

## 2024-05-09 ENCOUNTER — Other Ambulatory Visit: Payer: Self-pay

## 2024-05-09 ENCOUNTER — Encounter (HOSPITAL_COMMUNITY): Payer: Self-pay

## 2024-05-09 ENCOUNTER — Other Ambulatory Visit: Payer: Self-pay | Admitting: Physician Assistant

## 2024-05-09 ENCOUNTER — Encounter (HOSPITAL_COMMUNITY)
Admission: RE | Admit: 2024-05-09 | Discharge: 2024-05-09 | Disposition: A | Discharge status: Home / Self Care | Source: Ambulatory Visit | Attending: Orthopaedic Surgery | Admitting: Orthopaedic Surgery

## 2024-05-09 VITALS — BP 119/86 | HR 69 | Temp 98.4°F | Resp 17 | Ht 64.0 in | Wt 225.0 lb

## 2024-05-09 DIAGNOSIS — I4891 Unspecified atrial fibrillation: Secondary | ICD-10-CM | POA: Diagnosis not present

## 2024-05-09 DIAGNOSIS — E785 Hyperlipidemia, unspecified: Secondary | ICD-10-CM | POA: Diagnosis not present

## 2024-05-09 DIAGNOSIS — Z7901 Long term (current) use of anticoagulants: Secondary | ICD-10-CM | POA: Diagnosis not present

## 2024-05-09 DIAGNOSIS — Z8673 Personal history of transient ischemic attack (TIA), and cerebral infarction without residual deficits: Secondary | ICD-10-CM | POA: Diagnosis not present

## 2024-05-09 DIAGNOSIS — Z01812 Encounter for preprocedural laboratory examination: Secondary | ICD-10-CM | POA: Diagnosis not present

## 2024-05-09 DIAGNOSIS — Z01818 Encounter for other preprocedural examination: Secondary | ICD-10-CM

## 2024-05-09 DIAGNOSIS — I5030 Unspecified diastolic (congestive) heart failure: Secondary | ICD-10-CM | POA: Insufficient documentation

## 2024-05-09 DIAGNOSIS — M1712 Unilateral primary osteoarthritis, left knee: Secondary | ICD-10-CM | POA: Diagnosis not present

## 2024-05-09 DIAGNOSIS — Z95811 Presence of heart assist device: Secondary | ICD-10-CM | POA: Insufficient documentation

## 2024-05-09 DIAGNOSIS — Z981 Arthrodesis status: Secondary | ICD-10-CM | POA: Insufficient documentation

## 2024-05-09 DIAGNOSIS — G4733 Obstructive sleep apnea (adult) (pediatric): Secondary | ICD-10-CM | POA: Insufficient documentation

## 2024-05-09 HISTORY — DX: Pneumonia, unspecified organism: J18.9

## 2024-05-09 LAB — BASIC METABOLIC PANEL WITH GFR
Anion gap: 9 (ref 5–15)
BUN: 16 mg/dL (ref 8–23)
CO2: 27 mmol/L (ref 22–32)
Calcium: 9.3 mg/dL (ref 8.9–10.3)
Chloride: 104 mmol/L (ref 98–111)
Creatinine, Ser: 1.14 mg/dL (ref 0.61–1.24)
GFR, Estimated: >60 mL/min (ref >60)
Glucose, Bld: 90 mg/dL (ref 70–99)
Potassium: 4 mmol/L (ref 3.5–5.1)
Sodium: 140 mmol/L (ref 135–145)

## 2024-05-09 LAB — CBC
HCT: 44.3 % (ref 39.0–52.0)
Hemoglobin: 14.2 g/dL (ref 13.0–17.0)
MCH: 29.8 pg (ref 26.0–34.0)
MCHC: 32.1 g/dL (ref 30.0–36.0)
MCV: 92.9 fL (ref 80.0–100.0)
Platelets: 300 K/uL (ref 150–400)
RBC: 4.77 MIL/uL (ref 4.22–5.81)
RDW: 13.9 % (ref 11.5–15.5)
WBC: 10.2 K/uL (ref 4.0–10.5)
nRBC: 0 % (ref 0.0–0.2)

## 2024-05-09 LAB — SURGICAL PCR SCREEN
MRSA, PCR: NEGATIVE
Staphylococcus aureus: NEGATIVE

## 2024-05-09 MED ORDER — DOCUSATE SODIUM 100 MG PO CAPS: 100.0000 mg | ORAL_CAPSULE | Freq: Every day | ORAL | 2 refills | Status: DC | PRN

## 2024-05-09 MED ORDER — ONDANSETRON HCL 4 MG PO TABS: 4.0000 mg | ORAL_TABLET | Freq: Three times a day (TID) | ORAL | 0 refills | Status: DC | PRN

## 2024-05-09 MED ORDER — OXYCODONE-ACETAMINOPHEN 5-325 MG PO TABS: 1.0000 | ORAL_TABLET | Freq: Four times a day (QID) | ORAL | 0 refills | Status: DC | PRN

## 2024-05-09 MED ORDER — METHOCARBAMOL 500 MG PO TABS: 500.0000 mg | ORAL_TABLET | Freq: Two times a day (BID) | ORAL | 2 refills | Status: DC | PRN

## 2024-05-10 NOTE — Anesthesia Preprocedure Evaluation (Addendum)
 Anesthesia Evaluation  Patient identified by MRN, date of birth, ID band Patient awake    Reviewed: Allergy & Precautions, NPO status , Patient's Chart, lab work & pertinent test results  Airway Mallampati: III  TM Distance: >3 FB Neck ROM: Full    Dental no notable dental hx.    Pulmonary asthma , Current Smoker and Patient abstained from smoking.   Pulmonary exam normal        Cardiovascular hypertension, Pt. on home beta blockers +CHF  Normal cardiovascular exam+ dysrhythmias Atrial Fibrillation      Neuro/Psych  PSYCHIATRIC DISORDERS Anxiety Depression     Neuromuscular disease CVA    GI/Hepatic negative GI ROS, Neg liver ROS,,,  Endo/Other  negative endocrine ROS    Renal/GU negative Renal ROS     Musculoskeletal  (+) Arthritis ,    Abdominal  (+) + obese  Peds  Hematology  (+) Blood dyscrasia (Eliquis ) PLT: 300   Anesthesia Other Findings Primary osteoarthritis of left knee  Reproductive/Obstetrics                              Anesthesia Physical Anesthesia Plan  ASA: 3  Anesthesia Plan: Spinal and Regional   Post-op Pain Management: Regional block*   Induction:   PONV Risk Score and Plan: 0 and Ondansetron , Dexamethasone , Propofol  infusion, Midazolam  and Treatment may vary due to age or medical condition  Airway Management Planned: Simple Face Mask  Additional Equipment:   Intra-op Plan:   Post-operative Plan:   Informed Consent: I have reviewed the patients History and Physical, chart, labs and discussed the procedure including the risks, benefits and alternatives for the proposed anesthesia with the patient or authorized representative who has indicated his/her understanding and acceptance.     Dental advisory given  Plan Discussed with: CRNA  Anesthesia Plan Comments: (PAT note by Lynwood Hope, PA-C: 68 year old male current smoker follows with cardiology for  history of atrial fibrillation on Eliquis , CVA x 2, HFpEF, HLD, recent diagnosis of OSA on 02/28/2024 with recommendation for CPAP therapy, loop recorder in place. Echocardiogram 02/07/2023 showed EF 60 to 65%, normal RV function, no significant valvular disease.  Cardiac clearance per telephone encounter 03/02/2024 by Lamarr Satterfield, NP, Chart reviewed as part of pre-operative protocol coverage. Given past medical history and time since last visit, based on ACC/AHA guidelines, Cristian Gonzales is at acceptable risk for the planned procedure without further cardiovascular testing. Per Dr.Schumann 02/28/2024: Preop evaluation: Prior to knee surgery.  Good functional capacity, greater than 4 METS.  Denies any anginal symptoms.  RCRI score 1 given CVA history.  Overall would classify as intermediate risk for intermediate risk surgery. - No further cardiac workup recommended prior to surgery - Hold Eliquis  x 2 days prior to surgery.  Clinical pharmacy recommendation from Kristin Alvstad, RPH-CPP advised Eliquis  could be held 3 days prior to surgery.  Patient reports last dose of Eliquis  05/11/2024.  I called and spoke with the patient on 05/10/2024 to ensure that his last dose would be prior to 9 AM on 05/11/2024 and that he would not take his evening dose to allow for 72 hours off the medication.  History of C6-7 ACDF.  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  Preop labs reviewed, WNL.  EKG 03/18/2024: Normal sinus rhythm.  Rate 79. Left axis deviation. Low voltage QRS. Cannot rule out Anterior infarct , age undetermined  TTE  02/07/2023: 1. Left ventricular ejection fraction, by estimation, is 60 to 65%. The  left ventricle has normal function. The left ventricle has no regional  wall motion abnormalities. There is mild concentric left ventricular  hypertrophy. Left ventricular diastolic  parameters were normal.  2. Right ventricular systolic  function is normal. The right ventricular  size is normal.  3. The mitral valve is normal in structure. Trivial mitral valve  regurgitation. No evidence of mitral stenosis.  4. The aortic valve is tricuspid. There is mild calcification of the  aortic valve. Aortic valve regurgitation is not visualized. No aortic  stenosis is present.  5. The inferior vena cava is normal in size with greater than 50%  respiratory variability, suggesting right atrial pressure of 3 mmHg.   )         Anesthesia Quick Evaluation

## 2024-05-10 NOTE — Progress Notes (Signed)
 Anesthesia Chart Review:  68 year old male current smoker follows with cardiology for history of atrial fibrillation on Eliquis , CVA x 2, HFpEF, HLD, recent diagnosis of OSA on 02/28/2024 with recommendation for CPAP therapy, loop recorder in place. Echocardiogram 02/07/2023 showed EF 60 to 65%, normal RV function, no significant valvular disease.  Cardiac clearance per telephone encounter 03/02/2024 by Cristian Satterfield, NP, Chart reviewed as part of pre-operative protocol coverage. Given past medical history and time since last visit, based on ACC/AHA guidelines, Cristian Gonzales is at acceptable risk for the planned procedure without further cardiovascular testing. Per Dr.Schumann 02/28/2024: Preop evaluation: Prior to knee surgery.  Good functional capacity, greater than 4 METS.  Denies any anginal symptoms.  RCRI score 1 given CVA history.  Overall would classify as intermediate risk for intermediate risk surgery. - No further cardiac workup recommended prior to surgery - Hold Eliquis  x 2 days prior to surgery.  Clinical pharmacy recommendation from Cristian Gonzales, RPH-CPP advised Eliquis  could be held 3 days prior to surgery.  Patient reports last dose of Eliquis  05/11/2024.  I called and spoke with the patient on 05/10/2024 to ensure that his last dose would be prior to 9 AM on 05/11/2024 and that he would not take his evening dose to allow for 72 hours off the medication.  History of C6-7 ACDF.  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  Preop labs reviewed, WNL.  EKG 03/18/2024: Normal sinus rhythm.  Rate 79. Left axis deviation. Low voltage QRS. Cannot rule out Anterior infarct , age undetermined  TTE 02/07/2023: 1. Left ventricular ejection fraction, by estimation, is 60 to 65%. The  left ventricle has normal function. The left ventricle has no regional  wall motion abnormalities. There is mild concentric left ventricular  hypertrophy.  Left ventricular diastolic  parameters were normal.   2. Right ventricular systolic function is normal. The right ventricular  size is normal.   3. The mitral valve is normal in structure. Trivial mitral valve  regurgitation. No evidence of mitral stenosis.   4. The aortic valve is tricuspid. There is mild calcification of the  aortic valve. Aortic valve regurgitation is not visualized. No aortic  stenosis is present.   5. The inferior vena cava is normal in size with greater than 50%  respiratory variability, suggesting right atrial pressure of 3 mmHg.      Cristian Gonzales [facility] Short Stay Center/Anesthesiology Phone [phone] 05/10/2024 3:42 PM

## 2024-05-11 DIAGNOSIS — M1712 Unilateral primary osteoarthritis, left knee: Secondary | ICD-10-CM | POA: Diagnosis not present

## 2024-05-13 ENCOUNTER — Telehealth: Payer: Self-pay | Admitting: *Deleted

## 2024-05-13 ENCOUNTER — Other Ambulatory Visit: Payer: Self-pay | Admitting: *Deleted

## 2024-05-13 DIAGNOSIS — M1712 Unilateral primary osteoarthritis, left knee: Secondary | ICD-10-CM

## 2024-05-13 NOTE — Care Plan (Signed)
 [facility] RNCM call to patient to discuss his upcoming Left total knee arthroplasty with Dr. Jerri on 05/14/24 at [facility]. He is agreeable to case management. He lives with his spouse and will be assisted by her after discharge home. Has a RW already and CPM delivered by [employer]. Anticipate HHPT will be needed after a short hospital stay. Referral made to [facility] after choice provided. Reviewed all post op care instructions and questions answered. Will continue to follow for needs.

## 2024-05-13 NOTE — Telephone Encounter (Signed)
 [facility] RNCM pre-op  call completed.

## 2024-05-14 ENCOUNTER — Observation Stay (HOSPITAL_COMMUNITY)
Admission: RE | Admit: 2024-05-14 | Discharge: 2024-05-15 | Disposition: A | Discharge status: Home / Self Care | Attending: Orthopaedic Surgery | Admitting: Orthopaedic Surgery

## 2024-05-14 ENCOUNTER — Observation Stay (HOSPITAL_COMMUNITY)

## 2024-05-14 ENCOUNTER — Other Ambulatory Visit: Payer: Self-pay

## 2024-05-14 ENCOUNTER — Encounter (HOSPITAL_COMMUNITY): Payer: Self-pay | Admitting: Orthopaedic Surgery

## 2024-05-14 ENCOUNTER — Ambulatory Visit (HOSPITAL_COMMUNITY): Payer: Self-pay | Admitting: Physician Assistant

## 2024-05-14 ENCOUNTER — Encounter (HOSPITAL_COMMUNITY)
Admission: RE | Disposition: A | Discharge status: Home / Self Care | Payer: Self-pay | Source: Home / Self Care | Attending: Orthopaedic Surgery

## 2024-05-14 ENCOUNTER — Other Ambulatory Visit: Payer: Self-pay | Admitting: Physician Assistant

## 2024-05-14 ENCOUNTER — Ambulatory Visit (HOSPITAL_COMMUNITY)

## 2024-05-14 DIAGNOSIS — M1712 Unilateral primary osteoarthritis, left knee: Secondary | ICD-10-CM

## 2024-05-14 DIAGNOSIS — F17210 Nicotine dependence, cigarettes, uncomplicated: Secondary | ICD-10-CM | POA: Diagnosis not present

## 2024-05-14 DIAGNOSIS — Z96652 Presence of left artificial knee joint: Secondary | ICD-10-CM | POA: Diagnosis not present

## 2024-05-14 DIAGNOSIS — G8918 Other acute postprocedural pain: Secondary | ICD-10-CM | POA: Diagnosis not present

## 2024-05-14 DIAGNOSIS — F1090 Alcohol use, unspecified, uncomplicated: Secondary | ICD-10-CM | POA: Insufficient documentation

## 2024-05-14 DIAGNOSIS — I509 Heart failure, unspecified: Secondary | ICD-10-CM | POA: Diagnosis not present

## 2024-05-14 DIAGNOSIS — I110 Hypertensive heart disease with heart failure: Secondary | ICD-10-CM | POA: Insufficient documentation

## 2024-05-14 DIAGNOSIS — J45909 Unspecified asthma, uncomplicated: Secondary | ICD-10-CM | POA: Insufficient documentation

## 2024-05-14 DIAGNOSIS — M25562 Pain in left knee: Secondary | ICD-10-CM | POA: Diagnosis not present

## 2024-05-14 DIAGNOSIS — I5032 Chronic diastolic (congestive) heart failure: Secondary | ICD-10-CM | POA: Diagnosis not present

## 2024-05-14 DIAGNOSIS — I480 Paroxysmal atrial fibrillation: Secondary | ICD-10-CM

## 2024-05-14 HISTORY — PX: TOTAL KNEE ARTHROPLASTY: SHX125

## 2024-05-14 SURGERY — ARTHROPLASTY, KNEE, TOTAL
Anesthesia: Regional | Site: Knee | Laterality: Left

## 2024-05-14 MED ORDER — CARVEDILOL 3.125 MG PO TABS: 6.2500 mg | ORAL_TABLET | Freq: Once | ORAL | Status: AC

## 2024-05-14 MED ORDER — TRANEXAMIC ACID 1000 MG/10ML IV SOLN
2000.0000 mg | INTRAVENOUS | Status: DC
  Filled 2024-05-14: qty 20

## 2024-05-14 MED ORDER — TRANEXAMIC ACID 1000 MG/10ML IV SOLN
INTRAVENOUS | Status: DC | PRN
  Administered 2024-05-14: 2000 mg via TOPICAL

## 2024-05-14 MED ORDER — METOCLOPRAMIDE HCL 5 MG PO TABS: 5.0000 mg | ORAL_TABLET | Freq: Three times a day (TID) | ORAL | Status: DC | PRN

## 2024-05-14 MED ORDER — HYDROMORPHONE HCL 1 MG/ML IJ SOLN
1.0000 mg | Freq: Three times a day (TID) | INTRAMUSCULAR | Status: DC | PRN
  Administered 2024-05-14: 1 mg via INTRAVENOUS
  Filled 2024-05-14: qty 1

## 2024-05-14 MED ORDER — BUPIVACAINE IN DEXTROSE 0.75-8.25 % IT SOLN
INTRATHECAL | Status: DC | PRN
  Administered 2024-05-14: 1.6 mL via INTRATHECAL

## 2024-05-14 MED ORDER — OXYCODONE HCL 5 MG PO TABS: 5.0000 mg | ORAL_TABLET | Freq: Four times a day (QID) | ORAL | Status: DC | PRN

## 2024-05-14 MED ORDER — PHENYLEPHRINE HCL-NACL 20-0.9 MG/250ML-% IV SOLN
INTRAVENOUS | Status: DC | PRN
  Administered 2024-05-14: 20 ug/min via INTRAVENOUS

## 2024-05-14 MED ORDER — ACETAMINOPHEN 325 MG PO TABS: 325.0000 mg | ORAL_TABLET | Freq: Four times a day (QID) | ORAL | Status: DC | PRN

## 2024-05-14 MED ORDER — POVIDONE-IODINE 10 % EX SWAB
2.0000 | Freq: Once | CUTANEOUS | Status: AC
  Administered 2024-05-14: 2 via TOPICAL

## 2024-05-14 MED ORDER — OXYCODONE HCL 5 MG PO TABS
10.0000 mg | ORAL_TABLET | Freq: Four times a day (QID) | ORAL | Status: DC | PRN
  Administered 2024-05-14: 10 mg via ORAL
  Filled 2024-05-14: qty 2

## 2024-05-14 MED ORDER — OXYCODONE HCL 5 MG PO TABS
10.0000 mg | ORAL_TABLET | ORAL | Status: DC | PRN
  Administered 2024-05-14 – 2024-05-15 (×4): 10 mg via ORAL
  Filled 2024-05-14 (×4): qty 2

## 2024-05-14 MED ORDER — DOCUSATE SODIUM 100 MG PO CAPS
100.0000 mg | ORAL_CAPSULE | Freq: Two times a day (BID) | ORAL | Status: DC
  Administered 2024-05-14 – 2024-05-15 (×2): 100 mg via ORAL
  Filled 2024-05-14 (×2): qty 1

## 2024-05-14 MED ORDER — TRANEXAMIC ACID-NACL 1000-0.7 MG/100ML-% IV SOLN
1000.0000 mg | INTRAVENOUS | Status: AC
  Administered 2024-05-14: 1000 mg via INTRAVENOUS
  Filled 2024-05-14: qty 100

## 2024-05-14 MED ORDER — MENTHOL 3 MG MT LOZG: 1.0000 | LOZENGE | OROMUCOSAL | Status: DC | PRN

## 2024-05-14 MED ORDER — METHOCARBAMOL 500 MG PO TABS
500.0000 mg | ORAL_TABLET | Freq: Four times a day (QID) | ORAL | Status: DC | PRN
  Administered 2024-05-14 – 2024-05-15 (×3): 500 mg via ORAL
  Filled 2024-05-14 (×3): qty 1

## 2024-05-14 MED ORDER — SODIUM CHLORIDE 0.9 % IV SOLN: INTRAVENOUS | Status: DC

## 2024-05-14 MED ORDER — PHENYLEPHRINE 80 MCG/ML (10ML) SYRINGE FOR IV PUSH (FOR BLOOD PRESSURE SUPPORT)
PREFILLED_SYRINGE | INTRAVENOUS | Status: AC
  Filled 2024-05-14: qty 10

## 2024-05-14 MED ORDER — PROPOFOL 10 MG/ML IV BOLUS
INTRAVENOUS | Status: DC | PRN
  Administered 2024-05-14: 100 mg via INTRAVENOUS
  Administered 2024-05-14: 100 ug/kg/min via INTRAVENOUS

## 2024-05-14 MED ORDER — GLYCOPYRROLATE PF 0.2 MG/ML IJ SOSY
PREFILLED_SYRINGE | INTRAMUSCULAR | Status: DC | PRN
Start: 2024-05-14 — End: 2024-05-14
  Administered 2024-05-14: .1 mg via INTRAVENOUS

## 2024-05-14 MED ORDER — FENTANYL CITRATE (PF) 100 MCG/2ML IJ SOLN: 25.0000 ug | INTRAMUSCULAR | Status: DC | PRN

## 2024-05-14 MED ORDER — LIDOCAINE 2% (20 MG/ML) 5 ML SYRINGE
INTRAMUSCULAR | Status: AC
  Filled 2024-05-14: qty 5

## 2024-05-14 MED ORDER — BUPIVACAINE-MELOXICAM ER 400-12 MG/14ML IJ SOLN
INTRAMUSCULAR | Status: DC | PRN
  Administered 2024-05-14: 400 mg

## 2024-05-14 MED ORDER — ONDANSETRON HCL 4 MG/2ML IJ SOLN
4.0000 mg | Freq: Four times a day (QID) | INTRAMUSCULAR | Status: DC | PRN
Start: 2024-05-14 — End: 2024-05-15

## 2024-05-14 MED ORDER — ACETAMINOPHEN 500 MG PO TABS
1000.0000 mg | ORAL_TABLET | Freq: Four times a day (QID) | ORAL | Status: DC
  Administered 2024-05-14 – 2024-05-15 (×3): 1000 mg via ORAL
  Filled 2024-05-14 (×3): qty 2

## 2024-05-14 MED ORDER — TRANEXAMIC ACID-NACL 1000-0.7 MG/100ML-% IV SOLN
1000.0000 mg | Freq: Once | INTRAVENOUS | Status: AC
  Administered 2024-05-14: 1000 mg via INTRAVENOUS
  Filled 2024-05-14: qty 100

## 2024-05-14 MED ORDER — ONDANSETRON HCL 4 MG/2ML IJ SOLN: 4.0000 mg | Freq: Once | INTRAMUSCULAR | Status: DC | PRN

## 2024-05-14 MED ORDER — VANCOMYCIN HCL 1000 MG IV SOLR
INTRAVENOUS | Status: DC | PRN
  Administered 2024-05-14: 1000 mg

## 2024-05-14 MED ORDER — METHOCARBAMOL 1000 MG/10ML IJ SOLN: 500.0000 mg | Freq: Four times a day (QID) | INTRAMUSCULAR | Status: DC | PRN

## 2024-05-14 MED ORDER — PRONTOSAN WOUND IRRIGATION OPTIME
TOPICAL | Status: DC | PRN
  Administered 2024-05-14: 350 mL

## 2024-05-14 MED ORDER — PHENYLEPHRINE 80 MCG/ML (10ML) SYRINGE FOR IV PUSH (FOR BLOOD PRESSURE SUPPORT)
PREFILLED_SYRINGE | INTRAVENOUS | Status: DC | PRN
  Administered 2024-05-14 (×3): 80 ug via INTRAVENOUS

## 2024-05-14 MED ORDER — BUPIVACAINE-EPINEPHRINE (PF) 0.5% -1:200000 IJ SOLN
INTRAMUSCULAR | Status: DC | PRN
  Administered 2024-05-14: 30 mL via PERINEURAL

## 2024-05-14 MED ORDER — AMISULPRIDE (ANTIEMETIC) 5 MG/2ML IV SOLN: 10.0000 mg | Freq: Once | INTRAVENOUS | Status: DC | PRN

## 2024-05-14 MED ORDER — DEXAMETHASONE SODIUM PHOSPHATE 10 MG/ML IJ SOLN
10.0000 mg | Freq: Once | INTRAMUSCULAR | Status: AC
  Administered 2024-05-15: 10 mg via INTRAVENOUS
  Filled 2024-05-14: qty 1

## 2024-05-14 MED ORDER — ONDANSETRON HCL 4 MG/2ML IJ SOLN
INTRAMUSCULAR | Status: DC | PRN
  Administered 2024-05-14: 4 mg via INTRAVENOUS

## 2024-05-14 MED ORDER — DEXAMETHASONE SODIUM PHOSPHATE 10 MG/ML IJ SOLN
INTRAMUSCULAR | Status: AC
  Filled 2024-05-14: qty 1

## 2024-05-14 MED ORDER — VANCOMYCIN HCL 1000 MG IV SOLR
INTRAVENOUS | Status: AC
  Filled 2024-05-14: qty 20

## 2024-05-14 MED ORDER — OXYCODONE HCL 5 MG PO TABS: 5.0000 mg | ORAL_TABLET | ORAL | Status: DC | PRN

## 2024-05-14 MED ORDER — BUPIVACAINE-MELOXICAM ER 400-12 MG/14ML IJ SOLN
INTRAMUSCULAR | Status: AC
  Filled 2024-05-14: qty 1

## 2024-05-14 MED ORDER — CARVEDILOL 3.125 MG PO TABS
ORAL_TABLET | ORAL | Status: AC
Start: 2024-05-14 — End: 2024-05-14
  Administered 2024-05-14: 6.25 mg via ORAL
  Filled 2024-05-14: qty 2

## 2024-05-14 MED ORDER — ONDANSETRON HCL 4 MG PO TABS: 4.0000 mg | ORAL_TABLET | Freq: Four times a day (QID) | ORAL | Status: DC | PRN

## 2024-05-14 MED ORDER — PHENOL 1.4 % MT LIQD
1.0000 | OROMUCOSAL | Status: DC | PRN
Start: 2024-05-14 — End: 2024-05-15

## 2024-05-14 MED ORDER — ONDANSETRON HCL 4 MG/2ML IJ SOLN
INTRAMUSCULAR | Status: AC
  Filled 2024-05-14: qty 2

## 2024-05-14 MED ORDER — SODIUM CHLORIDE 0.9 % IR SOLN
Status: DC | PRN
  Administered 2024-05-14: 1000 mL

## 2024-05-14 MED ORDER — 0.9 % SODIUM CHLORIDE (POUR BTL) OPTIME
TOPICAL | Status: DC | PRN
  Administered 2024-05-14: 1000 mL

## 2024-05-14 MED ORDER — LIDOCAINE 2% (20 MG/ML) 5 ML SYRINGE
INTRAMUSCULAR | Status: DC | PRN
  Administered 2024-05-14: 60 mg via INTRAVENOUS

## 2024-05-14 MED ORDER — CEFAZOLIN SODIUM-DEXTROSE 2-4 GM/100ML-% IV SOLN
2.0000 g | INTRAVENOUS | Status: AC
  Administered 2024-05-14: 2 g via INTRAVENOUS
  Filled 2024-05-14: qty 100

## 2024-05-14 MED ORDER — DEXAMETHASONE SODIUM PHOSPHATE 10 MG/ML IJ SOLN
INTRAMUSCULAR | Status: DC | PRN
  Administered 2024-05-14: 10 mg via INTRAVENOUS

## 2024-05-14 MED ORDER — MIDAZOLAM HCL 2 MG/2ML IJ SOLN: 2.0000 mg | Freq: Once | INTRAMUSCULAR | Status: AC

## 2024-05-14 MED ORDER — ACETAMINOPHEN 10 MG/ML IV SOLN: 1000.0000 mg | Freq: Once | INTRAVENOUS | Status: DC | PRN

## 2024-05-14 MED ORDER — BUPROPION HCL ER (XL) 150 MG PO TB24
150.0000 mg | ORAL_TABLET | Freq: Every day | ORAL | Status: DC
  Filled 2024-05-14: qty 1

## 2024-05-14 MED ORDER — KETOROLAC TROMETHAMINE 15 MG/ML IJ SOLN
7.5000 mg | Freq: Four times a day (QID) | INTRAMUSCULAR | Status: DC
Start: 2024-05-14 — End: 2024-05-15
  Administered 2024-05-14 – 2024-05-15 (×3): 7.5 mg via INTRAVENOUS
  Filled 2024-05-14 (×3): qty 1

## 2024-05-14 MED ORDER — APIXABAN 2.5 MG PO TABS
2.5000 mg | ORAL_TABLET | Freq: Two times a day (BID) | ORAL | Status: DC
Start: 2024-05-15 — End: 2024-05-15
  Administered 2024-05-15: 2.5 mg via ORAL
  Filled 2024-05-14: qty 1

## 2024-05-14 MED ORDER — LACTATED RINGERS IV SOLN: INTRAVENOUS | Status: DC | PRN

## 2024-05-14 MED ORDER — MIDAZOLAM HCL 2 MG/2ML IJ SOLN
INTRAMUSCULAR | Status: AC
  Administered 2024-05-14: 2 mg via INTRAVENOUS
  Filled 2024-05-14: qty 2

## 2024-05-14 MED ORDER — CARVEDILOL 6.25 MG PO TABS
6.2500 mg | ORAL_TABLET | Freq: Two times a day (BID) | ORAL | Status: DC
  Administered 2024-05-14 – 2024-05-15 (×2): 6.25 mg via ORAL
  Filled 2024-05-14 (×2): qty 1

## 2024-05-14 MED ORDER — METOCLOPRAMIDE HCL 5 MG/ML IJ SOLN: 5.0000 mg | Freq: Three times a day (TID) | INTRAMUSCULAR | Status: DC | PRN

## 2024-05-14 MED ORDER — CHLORHEXIDINE GLUCONATE 0.12 % MT SOLN
OROMUCOSAL | Status: AC
  Administered 2024-05-14: 15 mL
  Filled 2024-05-14: qty 15

## 2024-05-14 MED ORDER — CEFAZOLIN SODIUM-DEXTROSE 2-4 GM/100ML-% IV SOLN
2.0000 g | Freq: Four times a day (QID) | INTRAVENOUS | Status: AC
  Administered 2024-05-14 (×2): 2 g via INTRAVENOUS
  Filled 2024-05-14 (×2): qty 100

## 2024-05-14 MED ORDER — FENTANYL CITRATE (PF) 100 MCG/2ML IJ SOLN
INTRAMUSCULAR | Status: DC
Start: 2024-05-14 — End: 2024-05-14
  Filled 2024-05-14: qty 2

## 2024-05-14 SURGICAL SUPPLY — 68 items
ALCOHOL 70% 16 OZ (MISCELLANEOUS) ×1 IMPLANT
BAG COUNTER SPONGE SURGICOUNT (BAG) IMPLANT
BAG DECANTER FOR FLEXI CONT (MISCELLANEOUS) ×2 IMPLANT
BANDAGE ESMARK 6X9 LF (GAUZE/BANDAGES/DRESSINGS) IMPLANT
BLADE SAG 18X100X1.27 (BLADE) ×2 IMPLANT
BLADE SAW SAG 90X13X1.27 (BLADE) ×2 IMPLANT
BLADE SAW SGTL 73X25 THK (BLADE) ×1 IMPLANT
BOWL SMART MIX CTS (DISPOSABLE) IMPLANT
CLSR STERI-STRIP ANTIMIC 1/2X4 (GAUZE/BANDAGES/DRESSINGS) IMPLANT
COMPONENT FEM KNEE STD PS 8 LT (Joint) IMPLANT
COMPONENT PATELLA PEG 3 32 (Joint) IMPLANT
COOLER ICEMAN CLASSIC (MISCELLANEOUS) ×2 IMPLANT
COVER SURGICAL LIGHT HANDLE (MISCELLANEOUS) ×2 IMPLANT
CUFF TOURN SGL QUICK 42 (TOURNIQUET CUFF) IMPLANT
CUFF TRNQT CYL 34X4.125X (TOURNIQUET CUFF) ×2 IMPLANT
DERMABOND ADVANCED .7 DNX12 (GAUZE/BANDAGES/DRESSINGS) ×2 IMPLANT
DRAPE EXTREMITY T 121X128X90 (DISPOSABLE) ×2 IMPLANT
DRAPE HALF SHEET 40X57 (DRAPES) ×1 IMPLANT
DRAPE INCISE IOBAN 66X45 STRL (DRAPES) ×1 IMPLANT
DRAPE POUCH INSTRU U-SHP 10X18 (DRAPES) ×1 IMPLANT
DRAPE SURG ORHT 6 SPLT 77X108 (DRAPES) IMPLANT
DRAPE U-SHAPE 47X51 STRL (DRAPES) ×4 IMPLANT
DRSG AQUACEL AG ADV 3.5X10 (GAUZE/BANDAGES/DRESSINGS) ×1 IMPLANT
DURAPREP 26ML APPLICATOR (WOUND CARE) ×3 IMPLANT
ELECT CAUTERY BLADE 6.4 (BLADE) ×1 IMPLANT
ELECT PENCIL ROCKER SW 15FT (MISCELLANEOUS) ×1 IMPLANT
ELECTRODE REM PT RTRN 9FT ADLT (ELECTROSURGICAL) ×1 IMPLANT
GLOVE BIOGEL PI IND STRL 7.0 (GLOVE) ×2 IMPLANT
GLOVE BIOGEL PI IND STRL 7.5 (GLOVE) ×5 IMPLANT
GLOVE ECLIPSE 7.0 STRL STRAW (GLOVE) ×3 IMPLANT
GLOVE SURG SYN 7.5 PF PI (GLOVE) ×4 IMPLANT
GLOVE SURG UNDER LTX SZ7.5 (GLOVE) ×4 IMPLANT
GOWN STRL REUS W/ TWL LRG LVL3 (GOWN DISPOSABLE) ×1 IMPLANT
GOWN STRL SURGICAL XL XLNG (GOWN DISPOSABLE) IMPLANT
GOWN TOGA ZIPPER T7+ PEEL AWAY (MISCELLANEOUS) ×1 IMPLANT
HOOD PEEL AWAY T7 (MISCELLANEOUS) ×2 IMPLANT
KIT BASIN OR (CUSTOM PROCEDURE TRAY) ×1 IMPLANT
KIT TURNOVER KIT B (KITS) ×1 IMPLANT
MANIFOLD NEPTUNE II (INSTRUMENTS) ×1 IMPLANT
MARKER SKIN DUAL TIP RULER LAB (MISCELLANEOUS) ×2 IMPLANT
NDL SPNL 18GX3.5 QUINCKE PK (NEEDLE) ×2 IMPLANT
NEEDLE SPNL 18GX3.5 QUINCKE PK (NEEDLE) ×1 IMPLANT
NS IRRIG 1000ML POUR BTL (IV SOLUTION) ×2 IMPLANT
PACK TOTAL JOINT (CUSTOM PROCEDURE TRAY) ×2 IMPLANT
PAD ARMBOARD POSITIONER FOAM (MISCELLANEOUS) ×2 IMPLANT
PAD COLD SHLDR WRAP-ON (PAD) ×1 IMPLANT
PIN DRILL HDLS TROCAR 75 4PK (PIN) IMPLANT
SCREW FEMALE HEX FIX 25X2.5 (ORTHOPEDIC DISPOSABLE SUPPLIES) IMPLANT
SET HNDPC FAN SPRY TIP SCT (DISPOSABLE) ×2 IMPLANT
SOLUTION PRONTOSAN WOUND 350ML (IRRIGATION / IRRIGATOR) ×2 IMPLANT
STAPLER SKIN PROX 35W (STAPLE) IMPLANT
STEM ARTISURF EF 12 SZ8-11 (Stem) IMPLANT
STEM TIB PERS SZ E 5D LT (Screw) IMPLANT
SUCTION TUBE FRAZIER 10FR DISP (SUCTIONS) IMPLANT
SUT ETHILON 2 0 FS 18 (SUTURE) IMPLANT
SUT ETHILON 2 0 PSLX (SUTURE) IMPLANT
SUT MNCRL AB 3-0 PS2 27 (SUTURE) IMPLANT
SUT STRATAFIX PDS+ 0 24IN (SUTURE) IMPLANT
SUT VIC AB 0 CT1 27XBRD ANBCTR (SUTURE) ×2 IMPLANT
SUT VIC AB 1 CTX 27 (SUTURE) ×6 IMPLANT
SUT VIC AB 2-0 CT1 TAPERPNT 27 (SUTURE) ×8 IMPLANT
SYR 50ML LL SCALE MARK (SYRINGE) ×2 IMPLANT
TOWEL GREEN STERILE (TOWEL DISPOSABLE) ×1 IMPLANT
TOWEL GREEN STERILE FF (TOWEL DISPOSABLE) ×1 IMPLANT
TRAY CATH INTERMITTENT SS 16FR (CATHETERS) IMPLANT
TUBE SUCT ARGYLE STRL (TUBING) ×2 IMPLANT
UNDERPAD 30X36 HEAVY ABSORB (UNDERPADS AND DIAPERS) ×1 IMPLANT
YANKAUER SUCT BULB TIP NO VENT (SUCTIONS) ×1 IMPLANT

## 2024-05-14 NOTE — Anesthesia Procedure Notes (Signed)
 Anesthesia Regional Block: Adductor canal block   Pre-Anesthetic Checklist: , timeout performed,  Correct Patient, Correct Site, Correct Laterality,  Correct Procedure,, site marked,  Risks and benefits discussed,  Surgical consent,  Pre-op evaluation,  At surgeon's request and post-op pain management  Laterality: Left  Prep: chloraprep       Needles:  Injection technique: Single-shot  Needle Type: Echogenic Stimulator Needle     Needle Length: 10cm  Needle Gauge: 20     Additional Needles:   Procedures:,,,, ultrasound used (permanent image in chart),,    Narrative:  Start time: 05/14/2024 9:20 AM End time: 05/14/2024 9:30 AM Injection made incrementally with aspirations every 5 mL.  Performed by: Personally  Anesthesiologist: Patrisha Bernardino SQUIBB, MD  Additional Notes: Functioning IV was confirmed and monitors were applied. A time-out was performed. Hand hygiene and sterile gloves were used. The thigh was placed in a frog-leg position and prepped in a sterile fashion. A 20ga Bbraun echogenic stimulator needle was placed using ultrasound guidance.  Negative aspiration and negative test dose prior to incremental administration of local anesthetic. The patient tolerated the procedure well.

## 2024-05-14 NOTE — Progress Notes (Signed)
 Orthopedic Tech Progress Note Patient Details:  Cristian Gonzales [birth year] [id number]  Ortho Devices Type of Ortho Device: Bone foam zero knee Ortho Device/Splint Location: LLE Ortho Device/Splint Interventions: Ordered, Application   Post Interventions Patient Tolerated: Well Instructions Provided: Care of device  Delanna LITTIE Pac 05/14/2024, 3:29 PM

## 2024-05-14 NOTE — Transfer of Care (Signed)
 Immediate Anesthesia Transfer of Care Note  Patient: Cristian Gonzales  Procedure(s) Performed: ARTHROPLASTY, KNEE, TOTAL (Left: Knee)  Patient Location: PACU  Anesthesia Type:MAC and Spinal  Level of Consciousness: awake and alert   Airway & Oxygen Therapy: Patient Spontanous Breathing and Patient connected to face mask oxygen  Post-op Assessment: Report given to RN and Post -op Vital signs reviewed and stable  Post vital signs: Reviewed and stable  Last Vitals:  Vitals Value Taken Time  BP 93/42 05/14/24 12:17  Temp    Pulse 53 05/14/24 12:18  Resp 20 05/14/24 12:19  SpO2 100 % 05/14/24 12:18  Vitals shown include unfiled device data.  Last Pain:  Vitals:   05/14/24 0833  TempSrc:   PainSc: 2          Complications: No notable events documented.

## 2024-05-14 NOTE — Op Note (Signed)
 Total Knee Arthroplasty Procedure Note  Preoperative diagnosis: Left knee osteoarthritis  Postoperative diagnosis:same  Operative findings: Complete loss of articular cartilage from all 3 compartments Varus deformity Mild flexion contracture  Operative procedure: Left total knee arthroplasty. CPT [phone]  Surgeon: N. Ozell Cummins, MD  Assist: Ronal Morna Grave, PA-C; necessary for the timely completion of procedure and due to complexity of procedure.  Anesthesia: Spinal, regional  Tourniquet time: see anesthesia record  Implants used: Zimmer persona press fit Femur: CR 8 Tibia: E Patella: 32 mm Polyethylene: 12 mm medial congruent  Indication: Cristian Gonzales is a 68 y.o. year old male with a history of knee pain. Having failed conservative management, the patient elected to proceed with a total knee arthroplasty.  We have reviewed the risk and benefits of the surgery and they elected to proceed after voicing understanding.  Procedure:  After informed consent was obtained and understanding of the risk were voiced including but not limited to bleeding, infection, damage to surrounding structures including nerves and vessels, blood clots, leg length inequality and the failure to achieve desired results, the operative extremity was marked with verbal confirmation of the patient in the holding area.   The patient was then brought to the operating room and transported to the operating room table in the supine position.  A tourniquet was applied to the operative extremity around the upper thigh. The operative limb was then prepped and draped in the usual sterile fashion and preoperative antibiotics were administered.  A time out was performed prior to the start of surgery confirming the correct extremity, preoperative antibiotic administration, as well as team members, implants and instruments available for the case. Correct surgical site was also confirmed with preoperative  radiographs. The limb was then elevated for exsanguination and the tourniquet was inflated. A midline incision was made and a standard medial parapatellar approach was performed.  The patella was everted which showed complete loss of articular cartilage.  The patella was resected down to 14 mm and sized to a 32 mm.  A cover was placed on the patella for protection from retractors.  We then turned our attention to the femur.  The ACL was sacrificed. Start site was drilled in the femur and the intramedullary distal femoral cutting guide was placed, set at 3 degrees valgus for the varus deformity, taking 11 mm of distal resection for the mild flexion contracture. The distal cut was made. Osteophytes were then removed.   Next, the proximal tibial cutting guide was placed with appropriate slope, varus/valgus alignment and depth of resection. A drop rod was attached to confirm that it was pointed to the second metatarsal.  The proximal tibial cut was made taking 2 mm off the low side. Gap blocks were then used to assess the extension gap and alignment, and appropriate soft tissue releases were performed. Attention was turned back to the femur, which was sized using the sizing guide to a size 8. Appropriate rotation of the femoral component was determined using epicondylar axis, Whiteside's line, and assessing the flexion gap under ligament tension. The appropriate size 4-in-1 cutting block was placed and cuts were made.  Posterior femoral osteophytes and uncapped bone were then removed with the curved osteotome.  Trial components were placed, and stability was checked in full extension, mid-flexion, and deep flexion.  The PCL was resected to balance the flexion space.  The patella tracked well without a lateral release. Trial components were then removed and tibial preparation performed.  The tibial  trial was pointed to the medial third of the tibial tubercle.  The tibia was sized for a size E component and prepped.   Trial components were removed.   The bony surfaces were irrigated with a pulse lavage and then dried. Final components were placed.  The final polyethylene liner, 12 mm thick, was inserted and checked to ensure the locking mechanism had engaged appropriately.  The stability of the construct was re-evaluated throughout a range of motion and found to be acceptable.  The tourniquet was deflated and hemostasis was achieved. The wound was irrigated with normal saline.  One gram of vancomycin  powder was placed in the surgical bed.  Topical mixture of 0.25% bupivacaine  and meloxicam  was placed in the joint for postoperative pain.  Capsular closure was performed with a #1 statafix in flexion, subcutaneous fat closed with a 0 vicryl suture, then subcutaneous tissue closed with interrupted 2.0 vicryl suture. The skin was then closed with a 2.0 nylon and dermabond. A sterile dressing was applied.  The patient was awakened in the operating room and taken to recovery in stable condition. All sponge, needle, and instrument counts were correct at the end of the case.  Morna Grave was necessary for opening, closing, retracting, limb positioning and overall facilitation and completion of the surgery.  Position: supine  Complications: none.  Time Out: performed  Drains/Packing: none Estimated blood loss: minimal Returned to Recovery Room: in good condition.   Mechanical VTE (DVT) Prophylaxis: sequential compression devices, TED thigh-high  Chemical VTE (DVT) Prophylaxis: eliquis  POD 1  Fluid Replacement  Crystalloid: see anesthesia record Blood: none  FFP: none   Specimens Removed: 1 to pathology  Sponge and Instrument Count Correct? yes  PACU: portable radiograph - knee AP and Lateral  Plan/RTC: Return in 2 weeks for suture removal.  Weight Bearing/Load Lower Extremity: full   Implant Name Type Inv. Item Serial No. Manufacturer Lot No. LRB No. Used Action  STEM ARTISURF EF 12 SZ8-11 - [id number] Stem  STEM ARTISURF EF 12 SZ8-11  ZIMMER [employer]ON(ORTH,TRAU,BIO,SG) [id number] Left 1 Implanted  COMPONENT FEM KNEE STD PS 8 LT - [id number] Joint COMPONENT FEM KNEE STD PS 8 LT  ZIMMER RECON(ORTH,TRAU,BIO,SG) [id number] Left 1 Implanted  STEM TIB PERS SZ E 5D LT - [id number] Screw STEM TIB PERS SZ E 5D LT  ZIMMER RECON(ORTH,TRAU,BIO,SG) [id number] Left 1 Implanted  COMPONENT PATELLA PEG 3 32 - [id number] Joint COMPONENT PATELLA PEG 3 32  ZIMMER RECON(ORTH,TRAU,BIO,SG) [id number] Left 1 Implanted    N. Ozell Cummins, MD [facility] 11:49 AM

## 2024-05-14 NOTE — Plan of Care (Signed)

## 2024-05-14 NOTE — Anesthesia Procedure Notes (Signed)
 Spinal  Patient location during procedure: OR Start time: 05/14/2024 10:20 AM End time: 05/14/2024 10:27 AM Reason for block: surgical anesthesia Staffing Performed: anesthesiologist  Anesthesiologist: Patrisha Bernardino SQUIBB, MD Performed by: Patrisha Bernardino SQUIBB, MD Authorized by: Patrisha Bernardino SQUIBB, MD   Preanesthetic Checklist Completed: patient identified, IV checked, risks and benefits discussed, surgical consent, monitors and equipment checked, pre-op evaluation and timeout performed Spinal Block Patient position: sitting Prep: DuraPrep Patient monitoring: cardiac monitor, continuous pulse ox and blood pressure Approach: midline Location: L3-4 Injection technique: single-shot Needle Needle type: Pencan  Needle gauge: 24 G Needle length: 9 cm Assessment Sensory level: T10 Events: CSF return Additional Notes Functioning IV was confirmed and monitors were applied. Sterile prep and drape, including hand hygiene and sterile gloves were used. The patient was positioned and the spine was prepped. The skin was anesthetized with lidocaine .  Free flow of clear CSF was obtained on the second attempt prior to injecting local anesthetic into the CSF.  The spinal needle aspirated freely following injection.  The needle was carefully withdrawn.  The patient tolerated the procedure well.

## 2024-05-14 NOTE — H&P (Signed)
 PREOPERATIVE H&P  Chief Complaint: left knee osteoarthritis  HPI: Cristian Gonzales is a 68 y.o. male who presents for surgical treatment of left knee osteoarthritis.  He denies any changes in medical history.  Past Surgical History:  Procedure Laterality Date   BUBBLE STUDY  03/19/2021   Procedure: BUBBLE STUDY;  Surgeon: Delford Maude BROCKS, MD;  Location: [facility] ENDOSCOPY;  Service: Cardiovascular;;   CERVICAL DISCECTOMY  09/06/2002   dr gaither   EYE SURGERY Bilateral    cataracts removed ([facility])   KNEE SURGERY  09/07/1979   right   LOOP RECORDER INSERTION N/A 03/19/2021   Procedure: LOOP RECORDER INSERTION;  Surgeon: Cindie Ole DASEN, MD;  Location: [facility] INVASIVE CV LAB;  Service: Cardiovascular;  Laterality: N/A;   LUMBAR LAMINECTOMY/DECOMPRESSION MICRODISCECTOMY N/A 07/27/2023   Procedure: Thoracic Eleven-Twelve Decompression;  Surgeon: Onetha Kuba, MD;  Location: [facility] OR;  Service: Neurosurgery;  Laterality: N/A;   NECK SURGERY     REPLACEMENT TOTAL KNEE     right   REPLACEMENT TOTAL KNEE     TEE WITHOUT CARDIOVERSION N/A 03/19/2021   Procedure: TRANSESOPHAGEAL ECHOCARDIOGRAM (TEE);  Surgeon: Delford Maude BROCKS, MD;  Location: [facility] ENDOSCOPY;  Service: Cardiovascular;  Laterality: N/A;   Social History   Socioeconomic History   Marital status: Married    Spouse name: Not on file   Number of children: Not on file   Years of education: Not on file   Highest education level: Not on file  Occupational History   Not on file  Tobacco Use   Smoking status: Some Days    Types: Cigars   Smokeless tobacco: Never   Tobacco comments:    Smokes 1 cigar twice weekly.  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    Married for 25 years    5 children ( 3 in [place] and 2 in [place])   Social Drivers of Health   Financial Resource Strain: Low Risk  (10/03/2023)   Overall Financial Resource Strain (CARDIA)    Difficulty of Paying Living Expenses: Not hard at all  Food Insecurity: No Food Insecurity (10/03/2023)   Hunger Vital Sign    Worried About Running Out of Food in the Last Year: Never true    Ran Out of Food in the Last Year: Never true  Transportation Needs: No Transportation Needs (10/03/2023)   PRAPARE - Administrator, Civil Service (Medical): No    Lack of Transportation (Non-Medical): No  Physical Activity: Sufficiently Active (10/03/2023)   Exercise Vital Sign    Days of Exercise per Week: 5 days    Minutes of Exercise per Session: 30 min  Stress: No Stress Concern Present (10/03/2023)   [employer] of Occupational Health - Occupational Stress Questionnaire    Feeling of Stress : Not at all  Social Connections: Moderately Integrated (10/03/2023)   Social Connection and Isolation Panel    Frequency of Communication with Friends and Family: More than three times a week    Frequency of Social Gatherings with  Friends and Family: More than three times a week    Attends Religious Services: Never    Database administrator or Organizations: Yes    Attends Engineer, structural: More than 4 times per year    Marital Status: Married   Family History  Problem Relation Age of Onset   Heart attack Father 65       Sudden Cardiac Death   Liver cancer Mother 70   Allergies  Allergen Reactions   Tramadol  Other (See Comments)    Passed out   Metoprolol  Other (See Comments)     Made my head feel like it was going to blow up    Morphine Other (See Comments)    Left red bumps on the back and did not work   Trazodone  And Nefazodone Other (See Comments)    Made the patient feel  flushed,with blurred vision and muscle cramps    Prior to Admission medications   Medication Sig Start Date End Date Taking? Authorizing Provider  apixaban  (ELIQUIS ) 5 MG TABS tablet Take 5 mg by mouth 2 (two) times daily.   Yes [provider]  buPROPion  (WELLBUTRIN  XL) 150 MG 24 hr tablet TAKE 1 TABLET BY MOUTH EVERY DAY 08/30/23  Yes Nafziger, Darleene, NP  carvedilol  (COREG ) 6.25 MG tablet Take 1 tablet (6.25 mg total) by mouth 2 (two) times daily with a meal. 03/16/24  Yes Burchette, Wolm ORN, MD  docusate sodium  (COLACE) 100 MG capsule Take 1 capsule (100 mg total) by mouth daily as needed. 05/09/24 05/09/25  Jule Ronal CROME, PA-C  Evolocumab  (REPATHA  SURECLICK) 140 MG/ML SOAJ INJECT 140 MG INTO THE SKIN EVERY 14 (FOURTEEN) DAYS. 03/26/24  Yes Kate Lonni CROME, MD  methocarbamol  (ROBAXIN ) 500 MG tablet Take 1 tablet (500 mg total) by mouth 2 (two) times daily as needed. 05/09/24   Jule Ronal CROME, PA-C  ondansetron  (ZOFRAN ) 4 MG tablet Take 1 tablet (4 mg total) by mouth every 8 (eight) hours as needed for nausea or vomiting. 05/09/24   Jule Ronal CROME, PA-C  oxyCODONE -acetaminophen  (PERCOCET) 5-325 MG tablet Take 1-2 tablets by mouth every 6 (six) hours as needed. To be taken after surgery 05/09/24   Jule Ronal CROME, PA-C     Positive ROS: All other systems have been reviewed and were otherwise negative with the exception of those mentioned in the HPI and as above.  Physical Exam: General: Alert, no acute distress Cardiovascular: No pedal edema Respiratory: No cyanosis, no use of accessory musculature GI: abdomen soft Skin: No lesions in the area of chief complaint Neurologic: Sensation intact distally Psychiatric: Patient is competent for consent with normal mood and affect Lymphatic: no lymphedema  MUSCULOSKELETAL: exam stable  Assessment: left knee osteoarthritis  Plan: Plan for Procedure(s): ARTHROPLASTY, KNEE, TOTAL  The risks benefits and alternatives were  discussed with the patient including but not limited to the risks of nonoperative treatment, versus surgical intervention including infection, bleeding, nerve injury,  blood clots, cardiopulmonary complications, morbidity, mortality, among others, and they were willing to proceed.   Ozell Cummins, MD 05/14/2024 7:09 AM

## 2024-05-14 NOTE — Evaluation (Signed)
 Physical Therapy Evaluation Patient Details Name: Cristian Gonzales MRN: [id number] DOB: Jul 19, 1956 Today's Date: 05/14/2024  History of Present Illness  68 y.o. male presents to [facility] hospital on 05/14/2024 for elective L TKA. PMH significant for anxiety, arthritis, afib, CVA, R TKR, cervical discectomy.  Clinical Impression  Pt presents to PT with deficits in functional mobility, gait, balance, strength, ROM. Pt is able to ambulate for household distances with support of the RW. PT provides education on the TKR exercise packet and encourages frequent mobilization with staff assistance. PT will follow up tomorrow for a progression of gait and to initiate stair training.        If plan is discharge home, recommend the following: A little help with bathing/dressing/bathroom;Assistance with cooking/housework;Assist for transportation;Help with stairs or ramp for entrance   Can travel by private vehicle        Equipment Recommendations None recommended by PT  Recommendations for Other Services       Functional Status Assessment Patient has had a recent decline in their functional status and demonstrates the ability to make significant improvements in function in a reasonable and predictable amount of time.     Precautions / Restrictions Precautions Precautions: Fall;Knee Precaution Booklet Issued: Yes (comment) Recall of Precautions/Restrictions: Intact Restrictions Weight Bearing Restrictions Per Provider Order: Yes LLE Weight Bearing Per Provider Order: Weight bearing as tolerated      Mobility  Bed Mobility Overal bed mobility: Needs Assistance Bed Mobility: Supine to Sit, Sit to Supine     Supine to sit: Supervision Sit to supine: Min assist        Transfers Overall transfer level: Needs assistance Equipment used: Rolling walker (2 wheels) Transfers: Sit to/from Stand Sit to Stand: Contact guard assist                Ambulation/Gait Ambulation/Gait assistance:  Contact guard assist Gait Distance (Feet): 150 Feet Assistive device: Rolling walker (2 wheels) Gait Pattern/deviations: Step-through pattern Gait velocity: reduced Gait velocity interpretation: <1.8 ft/sec, indicate of risk for recurrent falls   General Gait Details: slowed step-through gait, reduced stance time on LLE  Stairs            Wheelchair Mobility     Tilt Bed    Modified Rankin (Stroke Patients Only)       Balance Overall balance assessment: Needs assistance Sitting-balance support: No upper extremity supported, Feet supported Sitting balance-Leahy Scale: Good     Standing balance support: Bilateral upper extremity supported, Reliant on assistive device for balance Standing balance-Leahy Scale: Poor                               Pertinent Vitals/Pain Pain Assessment Pain Assessment: 0-10 Pain Score: 8  Pain Location: L knee Pain Descriptors / Indicators: Sore Pain Intervention(s): Patient requesting pain meds-RN notified    Home Living Family/patient expects to be discharged to:: Private residence Living Arrangements: Spouse/significant other Available Help at Discharge: Family Type of Home: House Home Access: Stairs to enter Entrance Stairs-Rails: None Entrance Stairs-Number of Steps: 1   Home Layout: One level Home Equipment: Cane - single Librarian, academic (2 wheels)      Prior Function Prior Level of Function : Independent/Modified Independent;Driving             Mobility Comments: utilizing SPC when walking the dog       Extremity/Trunk Assessment   Upper Extremity Assessment Upper Extremity Assessment: Overall [facility]  for tasks assessed    Lower Extremity Assessment Lower Extremity Assessment: LLE deficits/detail;RLE deficits/detail RLE Deficits / Details: pt lacks knee flexion ROM chronically after R TKA. knee flexion appears limited to ~80 degrees LLE Deficits / Details: generalized post-op weakness, pt with  at least 90 degrees L knee flexion observed during mobility, formal ROM assessment not complete    Cervical / Trunk Assessment Cervical / Trunk Assessment: Normal  Communication   Communication Communication: No apparent difficulties    Cognition Arousal: Alert Behavior During Therapy: WFL for tasks assessed/performed   PT - Cognitive impairments: No apparent impairments                         Following commands: Intact       Cueing Cueing Techniques: Verbal cues     General Comments General comments (skin integrity, edema, etc.): VSS on RA    Exercises Other Exercises Other Exercises: PT provides education on TKR exercise packet   Assessment/Plan    PT Assessment Patient needs continued PT services  PT Problem List Decreased strength;Decreased range of motion;Decreased activity tolerance;Decreased balance;Decreased mobility;Pain       PT Treatment Interventions DME instruction;Gait training;Stair training;Functional mobility training;Therapeutic activities;Balance training;Therapeutic exercise;Patient/family education    PT Goals (Current goals can be found in the Care Plan section)  Acute Rehab PT Goals Patient Stated Goal: to return to independence PT Goal Formulation: With patient Time For Goal Achievement: 05/18/24 Potential to Achieve Goals: Good    Frequency 7X/week     Co-evaluation               AM-PAC PT 6 Clicks Mobility  Outcome Measure Help needed turning from your back to your side while in a flat bed without using bedrails?: A Little Help needed moving from lying on your back to sitting on the side of a flat bed without using bedrails?: A Little Help needed moving to and from a bed to a chair (including a wheelchair)?: A Little Help needed standing up from a chair using your arms (e.g., wheelchair or bedside chair)?: A Little Help needed to walk in hospital room?: A Little Help needed climbing 3-5 steps with a railing? : A  Lot 6 Click Score: 17    End of Session Equipment Utilized During Treatment: Gait belt Activity Tolerance: Patient tolerated treatment well Patient left: in bed;with call bell/phone within reach Nurse Communication: Mobility status PT Visit Diagnosis: Other abnormalities of gait and mobility (R26.89);Muscle weakness (generalized) (M62.81);Pain Pain - Right/Left: Left Pain - part of body: Knee    Time: 8445-8386 PT Time Calculation (min) (ACUTE ONLY): 19 min   Charges:   PT Evaluation $PT Eval Low Complexity: 1 Low   PT General Charges $$ ACUTE PT VISIT: 1 Visit         Bernardino JINNY Ruth, PT, DPT Acute Rehabilitation Office [phone]   Bernardino JINNY Ruth 05/14/2024, 4:31 PM

## 2024-05-14 NOTE — Discharge Instructions (Signed)

## 2024-05-15 ENCOUNTER — Encounter (HOSPITAL_COMMUNITY): Payer: Self-pay | Admitting: Orthopaedic Surgery

## 2024-05-15 DIAGNOSIS — I110 Hypertensive heart disease with heart failure: Secondary | ICD-10-CM | POA: Diagnosis not present

## 2024-05-15 DIAGNOSIS — F1090 Alcohol use, unspecified, uncomplicated: Secondary | ICD-10-CM | POA: Diagnosis not present

## 2024-05-15 DIAGNOSIS — I509 Heart failure, unspecified: Secondary | ICD-10-CM | POA: Diagnosis not present

## 2024-05-15 DIAGNOSIS — M1712 Unilateral primary osteoarthritis, left knee: Secondary | ICD-10-CM | POA: Diagnosis not present

## 2024-05-15 DIAGNOSIS — Z96652 Presence of left artificial knee joint: Secondary | ICD-10-CM | POA: Diagnosis not present

## 2024-05-15 DIAGNOSIS — J45909 Unspecified asthma, uncomplicated: Secondary | ICD-10-CM | POA: Diagnosis not present

## 2024-05-15 DIAGNOSIS — F17210 Nicotine dependence, cigarettes, uncomplicated: Secondary | ICD-10-CM | POA: Diagnosis not present

## 2024-05-15 NOTE — Progress Notes (Signed)

## 2024-05-15 NOTE — Progress Notes (Signed)
 Subjective: 1 Day Post-Op Procedure(s) (LRB): ARTHROPLASTY, KNEE, TOTAL (Left) Patient reports pain as mild.    Objective: Vital signs in last 24 hours: Temp:  [97.6 F (36.4 C)-98.4 F (36.9 C)] 98.4 F (36.9 C) (09/09 0719) Pulse Rate:  [38-67] 56 (09/09 0719) Resp:  [11-21] 18 (09/09 0719) BP: (93-158)/(42-99) 135/72 (09/09 0719) SpO2:  [93 %-100 %] 99 % (09/09 0719)  Intake/Output from previous day: 09/08 0701 - 09/09 0700 In: 781.1 [P.O.:480; I.V.:101.1; IV Piggyback:200] Out: 926 [Urine:901; Blood:25] Intake/Output this shift: No intake/output data recorded.  No results for input(s): HGB in the last 72 hours. No results for input(s): WBC, RBC, HCT, PLT in the last 72 hours. No results for input(s): NA, K, CL, CO2, BUN, CREATININE, GLUCOSE, CALCIUM  in the last 72 hours. No results for input(s): LABPT, INR in the last 72 hours.  Neurologically intact Neurovascular intact Sensation intact distally Intact pulses distally Dorsiflexion/Plantar flexion intact Incision: dressing C/D/I No cellulitis present Compartment soft   Assessment/Plan: 1 Day Post-Op Procedure(s) (LRB): ARTHROPLASTY, KNEE, TOTAL (Left) Advance diet Up with therapy D/C IV fluids Discharge home with home health Once cleared by PT and able to urinate WBAT LLE       Cristian Gonzales Grave 05/15/2024, 7:55 AM
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
## 2024-05-15 NOTE — Progress Notes (Signed)
 Physical Therapy Treatment  Patient Details Name: Cristian Gonzales MRN: [id number] DOB: [birth year] Today's Date: 05/15/2024   History of Present Illness Pt is a 68 y.o. male presents to [facility] hospital on 05/14/2024 for elective L TKA. PMH significant for anxiety, arthritis, afib, CVA, R TKR, cervical discectomy.    PT Comments  Pt progressing towards physical therapy goals. Was able to perform transfers and ambulation with mod I to supervsion for safety and rollator for support.  Hands on guarding provided for safety with stair training. Reviewed HEP, and pt was educated on car transfer and appropriate activity progression. Will continue to follow and progress as able per POC.    If plan is discharge home, recommend the following: A little help with bathing/dressing/bathroom;Assistance with cooking/housework;Assist for transportation;Help with stairs or ramp for entrance;A little help with walking and/or transfers   Can travel by private vehicle        Equipment Recommendations  None recommended by PT    Recommendations for Other Services       Precautions / Restrictions Precautions Precautions: Fall;Knee Precaution Booklet Issued: Yes (comment) Recall of Precautions/Restrictions: Intact Restrictions Weight Bearing Restrictions Per Provider Order: Yes LLE Weight Bearing Per Provider Order: Weight bearing as tolerated     Mobility  Bed Mobility Overal bed mobility: Modified Independent             General bed mobility comments: Increased time    Transfers Overall transfer level: Modified independent Equipment used: Rollator (4 wheels) Transfers: Sit to/from Stand             General transfer comment: No assist required. Light cues for hand placement on seated surface for safety.    Ambulation/Gait Ambulation/Gait assistance: Supervision Gait Distance (Feet): 300 Feet Assistive device: Rollator (4 wheels) Gait Pattern/deviations: Step-through pattern Gait  velocity: Decreased Gait velocity interpretation: <1.31 ft/sec, indicative of household ambulator   General Gait Details: Slow but generally steady with Rollator for support. Increased trunk flexion and pt with decreased heel strike on the R due to baseline deficits from prior knee replacement that still bothers pt. On operative side, pt able to make good corrective changes with heel strike and step-through gait pattern   Stairs Stairs: Yes Stairs assistance: Contact guard assist Stair Management: One rail Right, Step to pattern, Forwards Number of Stairs: 1 (x3) General stair comments: VC's for sequencing and general safety.   Wheelchair Mobility     Tilt Bed    Modified Rankin (Stroke Patients Only)       Balance Overall balance assessment: Needs assistance Sitting-balance support: No upper extremity supported, Feet supported Sitting balance-Leahy Scale: Good     Standing balance support: Bilateral upper extremity supported, No upper extremity supported Standing balance-Leahy Scale: Fair                              Hotel manager: No apparent difficulties  Cognition Arousal: Alert Behavior During Therapy: WFL for tasks assessed/performed   PT - Cognitive impairments: No apparent impairments                         Following commands: Intact      Cueing Cueing Techniques: Verbal cues  Exercises Total Joint Exercises Ankle Circles/Pumps: 10 reps Quad Sets: 10 reps Towel Squeeze: 10 reps Heel Slides: 10 reps Hip ABduction/ADduction: 10 reps Straight Leg Raises: 10 reps Long Arc Quad: 10 reps Knee Flexion:  10 reps Goniometric ROM: 2-130 AROM    General Comments        Pertinent Vitals/Pain Pain Assessment Pain Assessment: Faces Faces Pain Scale: Hurts a little bit Pain Location: L knee Pain Descriptors / Indicators: Sore Pain Intervention(s): Limited activity within patient's tolerance, Monitored  during session, Repositioned    Home Living                          Prior Function            PT Goals (current goals can now be found in the care plan section) Acute Rehab PT Goals Patient Stated Goal: to return to independence PT Goal Formulation: With patient Time For Goal Achievement: 05/18/24 Potential to Achieve Goals: Good Progress towards PT goals: Progressing toward goals    Frequency    7X/week      PT Plan      Co-evaluation              AM-PAC PT 6 Clicks Mobility   Outcome Measure  Help needed turning from your back to your side while in a flat bed without using bedrails?: None Help needed moving from lying on your back to sitting on the side of a flat bed without using bedrails?: None Help needed moving to and from a bed to a chair (including a wheelchair)?: None Help needed standing up from a chair using your arms (e.g., wheelchair or bedside chair)?: None Help needed to walk in hospital room?: A Little Help needed climbing 3-5 steps with a railing? : A Little 6 Click Score: 22    End of Session Equipment Utilized During Treatment: Gait belt Activity Tolerance: Patient tolerated treatment well Patient left: in bed;with call bell/phone within reach Nurse Communication: Mobility status PT Visit Diagnosis: Other abnormalities of gait and mobility (R26.89);Muscle weakness (generalized) (M62.81);Pain Pain - Right/Left: Left Pain - part of body: Knee     Time: 9156-9084 PT Time Calculation (min) (ACUTE ONLY): 32 min  Charges:    $Gait Training: 8-22 mins $Therapeutic Exercise: 8-22 mins PT General Charges $$ ACUTE PT VISIT: 1 Visit                     Leita Sable, PT, DPT Acute Rehabilitation Services Secure Chat Preferred Office: [phone]    Leita JONETTA Sable 05/15/2024, 1:53 PM

## 2024-05-15 NOTE — Evaluation (Signed)
 Occupational Therapy Evaluation Patient Details Name: Cristian Gonzales MRN: [id number] DOB: March 20, 1956 Today's Date: 05/15/2024   History of Present Illness   68 y.o. male presents to [facility] hospital on 05/14/2024 for elective L TKA. PMH significant for anxiety, arthritis, afib, CVA, R TKR, cervical discectomy.     Clinical Impressions Pt ind at baseline with ADLs and was using rollator for functional mobility, lives with spouse who can assist at d/c. Pt currently needing up to min A for ADLs, ind with bed mobility and CGA for transfers with RW. Pt with more knee mobility in operative leg (LLE) vs RLE (which he had replaced previously), educated on compensatory strategies for LB ADLs and discussed use of shower seat, pt declines shower chair. Pt presenting with impairments listed below, will follow acutely. Follow physician recommendations for follow up therapies.      If plan is discharge home, recommend the following:   A little help with walking and/or transfers     Functional Status Assessment   Patient has had a recent decline in their functional status and demonstrates the ability to make significant improvements in function in a reasonable and predictable amount of time.     Equipment Recommendations   None recommended by OT     Recommendations for Other Services   PT consult     Precautions/Restrictions   Precautions Precautions: Fall;Knee Precaution Booklet Issued: Yes (comment) Recall of Precautions/Restrictions: Intact Restrictions Weight Bearing Restrictions Per Provider Order: Yes LLE Weight Bearing Per Provider Order: Weight bearing as tolerated     Mobility Bed Mobility Overal bed mobility: Independent                  Transfers Overall transfer level: Needs assistance Equipment used: Rolling walker (2 wheels) Transfers: Sit to/from Stand Sit to Stand: Contact guard assist                  Balance Overall balance assessment: Needs  assistance Sitting-balance support: No upper extremity supported, Feet supported Sitting balance-Leahy Scale: Good     Standing balance support: Bilateral upper extremity supported, Reliant on assistive device for balance Standing balance-Leahy Scale: Poor                             ADL either performed or assessed with clinical judgement   ADL Overall ADL's : Needs assistance/impaired Eating/Feeding: Set up   Grooming: Set up   Upper Body Bathing: Contact guard assist   Lower Body Bathing: Minimal assistance   Upper Body Dressing : Contact guard assist   Lower Body Dressing: Minimal assistance   Toilet Transfer: Contact guard assist;Ambulation;Rolling walker (2 wheels)   Toileting- Clothing Manipulation and Hygiene: Contact guard assist       Functional mobility during ADLs: Contact guard assist;Rolling walker (2 wheels)       Vision   Vision Assessment?: No apparent visual deficits     Perception Perception: Not tested       Praxis Praxis: Not tested       Pertinent Vitals/Pain Pain Assessment Pain Assessment: Faces Pain Score: 2  Faces Pain Scale: Hurts a little bit Pain Location: L knee Pain Descriptors / Indicators: Sore Pain Intervention(s): Limited activity within patient's tolerance, Monitored during session, Repositioned     Extremity/Trunk Assessment Upper Extremity Assessment Upper Extremity Assessment: Overall WFL for tasks assessed   Lower Extremity Assessment Lower Extremity Assessment: Defer to PT evaluation   Cervical / Trunk  Assessment Cervical / Trunk Assessment: Normal   Communication Communication Communication: No apparent difficulties   Cognition Arousal: Alert Behavior During Therapy: WFL for tasks assessed/performed Cognition: No apparent impairments                               Following commands: Intact       Cueing  General Comments   Cueing Techniques: Verbal cues  VSS on RA    Exercises     Shoulder Instructions      Home Living Family/patient expects to be discharged to:: Private residence Living Arrangements: Spouse/significant other Available Help at Discharge: Family Type of Home: House Home Access: Stairs to enter Secretary/administrator of Steps: 1 Entrance Stairs-Rails: None Home Layout: One level     Bathroom Shower/Tub: Chief Strategy Officer: Standard Bathroom Accessibility: No   Home Equipment: Cane - single point;Rollator (4 wheels)          Prior Functioning/Environment Prior Level of Function : Independent/Modified Independent;Driving             Mobility Comments: utilizing SPC when walking the dog ADLs Comments: ind    OT Problem List: Decreased strength;Decreased range of motion;Decreased activity tolerance   OT Treatment/Interventions: Self-care/ADL training;Therapeutic exercise      OT Goals(Current goals can be found in the care plan section)   Acute Rehab OT Goals Patient Stated Goal: none stated OT Goal Formulation: With patient Time For Goal Achievement: 05/29/24 Potential to Achieve Goals: Good ADL Goals Pt Will Perform Upper Body Dressing: with supervision;sitting Pt Will Perform Lower Body Dressing: with supervision;sit to/from stand;sitting/lateral leans Pt Will Transfer to Toilet: with supervision;ambulating;regular height toilet   OT Frequency:  Min 2X/week    Co-evaluation              AM-PAC OT 6 Clicks Daily Activity     Outcome Measure Help from another person eating meals?: A Little Help from another person taking care of personal grooming?: A Little Help from another person toileting, which includes using toliet, bedpan, or urinal?: A Little Help from another person bathing (including washing, rinsing, drying)?: A Little Help from another person to put on and taking off regular upper body clothing?: A Little Help from another person to put on and taking off regular  lower body clothing?: A Little 6 Click Score: 18   End of Session Equipment Utilized During Treatment: Rolling walker (2 wheels) Nurse Communication: Mobility status  Activity Tolerance: Patient tolerated treatment well Patient left: in chair;with call bell/phone within reach;with family/visitor present  OT Visit Diagnosis: Unsteadiness on feet (R26.81);Other abnormalities of gait and mobility (R26.89);Muscle weakness (generalized) (M62.81)                Time: 9187-9167 OT Time Calculation (min): 20 min Charges:  OT General Charges $OT Visit: 1 Visit OT Evaluation $OT Eval Low Complexity: 1 Low  Ciara Kagan K, OTD, OTR/L SecureChat Preferred Acute Rehab [phone]  Dalinda Heidt K Koonce 05/15/2024, 9:52 AM

## 2024-05-15 NOTE — Care Management Obs Status (Signed)
 MEDICARE OBSERVATION STATUS NOTIFICATION   Patient Details  Name: Cristian Gonzales MRN: [id number] Date of Birth: 30-Jan-1956   Medicare Observation Status Notification Given:  Yes    Jon Cruel 05/15/2024, 10:00 AM

## 2024-05-16 ENCOUNTER — Telehealth: Payer: Self-pay | Admitting: Adult Health

## 2024-05-16 DIAGNOSIS — F411 Generalized anxiety disorder: Secondary | ICD-10-CM | POA: Diagnosis not present

## 2024-05-16 DIAGNOSIS — J45909 Unspecified asthma, uncomplicated: Secondary | ICD-10-CM | POA: Diagnosis not present

## 2024-05-16 DIAGNOSIS — M48061 Spinal stenosis, lumbar region without neurogenic claudication: Secondary | ICD-10-CM | POA: Diagnosis not present

## 2024-05-16 DIAGNOSIS — I110 Hypertensive heart disease with heart failure: Secondary | ICD-10-CM | POA: Diagnosis not present

## 2024-05-16 DIAGNOSIS — I5032 Chronic diastolic (congestive) heart failure: Secondary | ICD-10-CM | POA: Diagnosis not present

## 2024-05-16 DIAGNOSIS — I480 Paroxysmal atrial fibrillation: Secondary | ICD-10-CM | POA: Diagnosis not present

## 2024-05-16 DIAGNOSIS — F32A Depression, unspecified: Secondary | ICD-10-CM | POA: Diagnosis not present

## 2024-05-16 DIAGNOSIS — F17210 Nicotine dependence, cigarettes, uncomplicated: Secondary | ICD-10-CM | POA: Diagnosis not present

## 2024-05-16 DIAGNOSIS — Z471 Aftercare following joint replacement surgery: Secondary | ICD-10-CM | POA: Diagnosis not present

## 2024-05-16 NOTE — Telephone Encounter (Unsigned)
 Copied from CRM [id number]. Topic: General - Other >> May 16, 2024  3:14 PM Franky GRADE wrote: Reason for CRM: Jerel from [facility] is call to advise that they started Physical therapy due to Left knee replacement  3 times a week for two weeks and patient will go to outpatient PT afterwards. Best call back number is [phone].

## 2024-05-17 DIAGNOSIS — F32A Depression, unspecified: Secondary | ICD-10-CM | POA: Diagnosis not present

## 2024-05-17 DIAGNOSIS — Z471 Aftercare following joint replacement surgery: Secondary | ICD-10-CM | POA: Diagnosis not present

## 2024-05-17 DIAGNOSIS — I110 Hypertensive heart disease with heart failure: Secondary | ICD-10-CM | POA: Diagnosis not present

## 2024-05-17 DIAGNOSIS — F411 Generalized anxiety disorder: Secondary | ICD-10-CM | POA: Diagnosis not present

## 2024-05-17 DIAGNOSIS — J45909 Unspecified asthma, uncomplicated: Secondary | ICD-10-CM | POA: Diagnosis not present

## 2024-05-17 DIAGNOSIS — I5032 Chronic diastolic (congestive) heart failure: Secondary | ICD-10-CM | POA: Diagnosis not present

## 2024-05-17 DIAGNOSIS — I480 Paroxysmal atrial fibrillation: Secondary | ICD-10-CM | POA: Diagnosis not present

## 2024-05-17 DIAGNOSIS — M48061 Spinal stenosis, lumbar region without neurogenic claudication: Secondary | ICD-10-CM | POA: Diagnosis not present

## 2024-05-17 DIAGNOSIS — F17210 Nicotine dependence, cigarettes, uncomplicated: Secondary | ICD-10-CM | POA: Diagnosis not present

## 2024-05-18 DIAGNOSIS — F17210 Nicotine dependence, cigarettes, uncomplicated: Secondary | ICD-10-CM | POA: Diagnosis not present

## 2024-05-18 DIAGNOSIS — I480 Paroxysmal atrial fibrillation: Secondary | ICD-10-CM | POA: Diagnosis not present

## 2024-05-18 DIAGNOSIS — M48061 Spinal stenosis, lumbar region without neurogenic claudication: Secondary | ICD-10-CM | POA: Diagnosis not present

## 2024-05-18 DIAGNOSIS — I5032 Chronic diastolic (congestive) heart failure: Secondary | ICD-10-CM | POA: Diagnosis not present

## 2024-05-18 DIAGNOSIS — F411 Generalized anxiety disorder: Secondary | ICD-10-CM | POA: Diagnosis not present

## 2024-05-18 DIAGNOSIS — J45909 Unspecified asthma, uncomplicated: Secondary | ICD-10-CM | POA: Diagnosis not present

## 2024-05-18 DIAGNOSIS — Z471 Aftercare following joint replacement surgery: Secondary | ICD-10-CM | POA: Diagnosis not present

## 2024-05-18 DIAGNOSIS — F32A Depression, unspecified: Secondary | ICD-10-CM | POA: Diagnosis not present

## 2024-05-18 DIAGNOSIS — I110 Hypertensive heart disease with heart failure: Secondary | ICD-10-CM | POA: Diagnosis not present

## 2024-05-19 DIAGNOSIS — Z471 Aftercare following joint replacement surgery: Secondary | ICD-10-CM | POA: Diagnosis not present

## 2024-05-19 DIAGNOSIS — F17210 Nicotine dependence, cigarettes, uncomplicated: Secondary | ICD-10-CM | POA: Diagnosis not present

## 2024-05-19 DIAGNOSIS — F411 Generalized anxiety disorder: Secondary | ICD-10-CM | POA: Diagnosis not present

## 2024-05-19 DIAGNOSIS — I5032 Chronic diastolic (congestive) heart failure: Secondary | ICD-10-CM | POA: Diagnosis not present

## 2024-05-19 DIAGNOSIS — F32A Depression, unspecified: Secondary | ICD-10-CM | POA: Diagnosis not present

## 2024-05-19 DIAGNOSIS — I480 Paroxysmal atrial fibrillation: Secondary | ICD-10-CM | POA: Diagnosis not present

## 2024-05-19 DIAGNOSIS — I110 Hypertensive heart disease with heart failure: Secondary | ICD-10-CM | POA: Diagnosis not present

## 2024-05-19 DIAGNOSIS — M48061 Spinal stenosis, lumbar region without neurogenic claudication: Secondary | ICD-10-CM | POA: Diagnosis not present

## 2024-05-19 DIAGNOSIS — J45909 Unspecified asthma, uncomplicated: Secondary | ICD-10-CM | POA: Diagnosis not present

## 2024-05-22 ENCOUNTER — Other Ambulatory Visit: Payer: Self-pay | Admitting: Physician Assistant

## 2024-05-22 ENCOUNTER — Telehealth: Payer: Self-pay | Admitting: *Deleted

## 2024-05-22 DIAGNOSIS — Z471 Aftercare following joint replacement surgery: Secondary | ICD-10-CM | POA: Diagnosis not present

## 2024-05-22 DIAGNOSIS — J45909 Unspecified asthma, uncomplicated: Secondary | ICD-10-CM | POA: Diagnosis not present

## 2024-05-22 DIAGNOSIS — I480 Paroxysmal atrial fibrillation: Secondary | ICD-10-CM | POA: Diagnosis not present

## 2024-05-22 DIAGNOSIS — F32A Depression, unspecified: Secondary | ICD-10-CM | POA: Diagnosis not present

## 2024-05-22 DIAGNOSIS — F411 Generalized anxiety disorder: Secondary | ICD-10-CM | POA: Diagnosis not present

## 2024-05-22 DIAGNOSIS — M48061 Spinal stenosis, lumbar region without neurogenic claudication: Secondary | ICD-10-CM | POA: Diagnosis not present

## 2024-05-22 DIAGNOSIS — I110 Hypertensive heart disease with heart failure: Secondary | ICD-10-CM | POA: Diagnosis not present

## 2024-05-22 DIAGNOSIS — F17210 Nicotine dependence, cigarettes, uncomplicated: Secondary | ICD-10-CM | POA: Diagnosis not present

## 2024-05-22 DIAGNOSIS — I5032 Chronic diastolic (congestive) heart failure: Secondary | ICD-10-CM | POA: Diagnosis not present

## 2024-05-22 MED ORDER — HYDROCODONE-ACETAMINOPHEN 5-325 MG PO TABS: 1.0000 | ORAL_TABLET | Freq: Three times a day (TID) | ORAL | 0 refills | Status: DC | PRN

## 2024-05-22 NOTE — Telephone Encounter (Signed)
 Sent in norco

## 2024-05-22 NOTE — Anesthesia Postprocedure Evaluation (Signed)
 Anesthesia Post Note  Patient: [name]  Procedure(s) Performed: ARTHROPLASTY, KNEE, TOTAL (Left: Knee)     Patient location during evaluation: PACU Anesthesia Type: Regional and Spinal Level of consciousness: awake Pain management: pain level controlled Vital Signs Assessment: post-procedure vital signs reviewed and stable Respiratory status: spontaneous breathing, nonlabored ventilation and respiratory function stable Cardiovascular status: blood pressure returned to baseline and stable Postop Assessment: no apparent nausea or vomiting Anesthetic complications: no   No notable events documented.  Last Vitals:  Vitals:   05/15/24 0402 05/15/24 0719  BP: 136/77 135/72  Pulse: (!) 54 (!) 56  Resp: 18 18  Temp: 36.6 C 36.9 C  SpO2: 100% 99%    Last Pain:  Vitals:   05/15/24 0930  TempSrc:   PainSc: 7                  Jadee Golebiewski P Daylen Hack

## 2024-05-22 NOTE — Telephone Encounter (Signed)
 Call to patient to check status. He ran out of Oxycodone  a few days ago he states, but didn't call to ask for refill. He actually states he didn't like the way it makes him feel. He mentioned if there was something else. I suggested we could try Norco if he was willing to and you thought OK. What do you think?

## 2024-05-23 ENCOUNTER — Ambulatory Visit: Admitting: Physical Therapy

## 2024-05-23 ENCOUNTER — Encounter: Payer: Self-pay | Admitting: Physical Therapy

## 2024-05-23 DIAGNOSIS — M25562 Pain in left knee: Secondary | ICD-10-CM

## 2024-05-23 DIAGNOSIS — R2689 Other abnormalities of gait and mobility: Secondary | ICD-10-CM | POA: Diagnosis not present

## 2024-05-23 DIAGNOSIS — R2681 Unsteadiness on feet: Secondary | ICD-10-CM | POA: Diagnosis not present

## 2024-05-23 DIAGNOSIS — M25662 Stiffness of left knee, not elsewhere classified: Secondary | ICD-10-CM | POA: Diagnosis not present

## 2024-05-23 DIAGNOSIS — R600 Localized edema: Secondary | ICD-10-CM

## 2024-05-23 DIAGNOSIS — M6281 Muscle weakness (generalized): Secondary | ICD-10-CM | POA: Diagnosis not present

## 2024-05-23 NOTE — Therapy (Signed)
 OUTPATIENT PHYSICAL THERAPY EVALUATION   Patient Name: Cristian Gonzales MRN: [id number] DOB:24-Jan-1956, 68 y.o., male Today's Date: 05/23/2024  END OF SESSION:  PT End of Session - 05/23/24 1330     Visit Number 1    Number of Visits 16    Date for PT Re-Evaluation 07/18/24    Authorization Type [employer] $25 copay    Progress Note Due on Visit 10    PT Start Time 1255    PT Stop Time 1325    PT Time Calculation (min) 30 min    Activity Tolerance Patient tolerated treatment well    Behavior During Therapy WFL for tasks assessed/performed          Past Medical History:  Diagnosis Date   Anxiety    Arthritis    Cervical spine fracture (HCC)    13 -diving accident, no neurolgic sequelae   Depression    Paroxysmal atrial fibrillation (HCC)    Pneumonia    x several   Stroke (HCC)    Syncope 11/27/2012   From Tramadol    Past Surgical History:  Procedure Laterality Date   BUBBLE STUDY  03/19/2021   Procedure: BUBBLE STUDY;  Surgeon: Delford Maude BROCKS, MD;  Location: [facility] ENDOSCOPY;  Service: Cardiovascular;;   CERVICAL DISCECTOMY  09/06/2002   dr gaither   EYE SURGERY Bilateral    cataracts removed ([facility])   KNEE SURGERY  09/07/1979   right   LOOP RECORDER INSERTION N/A 03/19/2021   Procedure: LOOP RECORDER INSERTION;  Surgeon: Cindie Ole DASEN, MD;  Location: [facility] INVASIVE CV LAB;  Service: Cardiovascular;  Laterality: N/A;   LUMBAR LAMINECTOMY/DECOMPRESSION MICRODISCECTOMY N/A 07/27/2023   Procedure: Thoracic Eleven-Twelve Decompression;  Surgeon: Onetha Kuba, MD;  Location: [facility] OR;  Service: Neurosurgery;  Laterality: N/A;   NECK SURGERY     REPLACEMENT TOTAL KNEE     right   REPLACEMENT TOTAL KNEE     TEE WITHOUT CARDIOVERSION N/A 03/19/2021   Procedure: TRANSESOPHAGEAL ECHOCARDIOGRAM (TEE);  Surgeon: Delford Maude BROCKS, MD;  Location: [facility] ENDOSCOPY;  Service: Cardiovascular;  Laterality: N/A;   TOTAL KNEE ARTHROPLASTY Left 05/14/2024   Procedure: ARTHROPLASTY,  KNEE, TOTAL;  Surgeon: Jerri Kay CHRISTELLA, MD;  Location: [facility] OR;  Service: Orthopedics;  Laterality: Left;   Patient Active Problem List   Diagnosis Date Noted   Status post total left knee replacement 05/14/2024   Primary osteoarthritis of left knee 02/07/2024   Body mass index 40.0-44.9, adult (HCC) 02/07/2024   Myelopathy (HCC) 07/27/2023   Spinal stenosis of lumbar region 07/25/2023   Cord compression myelopathy (HCC) 07/25/2023   Paroxysmal atrial fibrillation (HCC) 07/25/2023   Essential hypertension 07/25/2023   Obesity (BMI 30-39.9) 07/25/2023   Atrial fibrillation with RVR (HCC) 02/07/2023   Elevated troponin 02/07/2023   Hypokalemia 02/07/2023   SIRS (systemic inflammatory response syndrome) (HCC) 02/07/2023   Pulmonary nodule 02/07/2023   Chronic diastolic CHF (congestive heart failure) (HCC) 02/07/2023   Chest pain 02/06/2023   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   Hyperlipidemia 07/30/2015   Lumbar disc disease with radiculopathy 07/29/2014   Left-sided thoracic back pain 06/05/2014   Numbness and tingling sensation of skin 06/25/2013   Syncope 11/27/2012   Depression 11/27/2012   Epididymitis, left 11/23/2010   Allergic rhinitis 11/07/2009   Extrinsic asthma 02/05/2008   Anxiety state 08/08/2007    PCP: Merna Huxley, NP  REFERRING PROVIDER: Jerri Kay CHRISTELLA, MD  REFERRING DIAG: [phone] (ICD-10-CM) - Primary osteoarthritis of left knee  Rationale for Evaluation and Treatment: Rehabilitation  THERAPY DIAG:  Acute pain of left knee - Plan: PT plan of care cert/re-cert  Stiffness of left knee, not elsewhere classified - Plan: PT plan of care cert/re-cert  Muscle weakness (generalized) - Plan: PT plan of care cert/re-cert  Localized edema - Plan: PT plan of care cert/re-cert  Unsteadiness on feet - Plan: PT plan of care cert/re-cert  Other abnormalities of gait and mobility - Plan: PT plan of care cert/re-cert  ONSET DATE: 05/14/24   SUBJECTIVE:                                                                                                                                                                                            SUBJECTIVE STATEMENT: Pt is s/p Lt TKA on 05/14/24. He is amb with a SPC at this time.  PERTINENT HISTORY:  anxiety, arthritis, afib, CVA, R TKR, cervical discectomy, depression  PAIN:  Are you having pain? Yes: NPRS scale: 5 currently, up to 10, at best 5/10 Pain location: Lt knee Pain description: sharp, aching, sore, deep Aggravating factors: standing, walking Relieving factors: sitting, resting  PRECAUTIONS:  None  RED FLAGS: None   WEIGHT BEARING RESTRICTIONS:  No  FALLS:  Has patient fallen in last 6 months? No  LIVING ENVIRONMENT: Lives with: lives with their spouse Lives in: House/apartment Stairs: Yes: External: 1 steps; none Has following equipment at home: Single point cane and Walker - 2 wheeled  OCCUPATION:  Retired - Set designer  PLOF:  Independent and Leisure: smoke cigars, go fishing, belongs to [employer] - goes 4 days a week; walks dog 1/2 mile every morning  PATIENT GOALS:  Improve pain, flexion in knee   OBJECTIVE:  Note: Objective measures were completed at Evaluation unless otherwise noted.   PATIENT SURVEYS:  Patient-Specific Activity Scoring Scheme  0 represents "unable to perform." 10 represents "able to perform at prior level. 0 1 2 3 4 5 6 7 8 9  10 (Date and Score)   Activity Eval     1. Walking 3     2. Bending knee  3    3. Fishing 3   4. Cigar lounge 3   5. Going out to dinner 3   Score 3    Total score = sum of the activity scores/number of activities Minimum detectable change (90%CI) for average score = 2 points Minimum detectable change (90%CI) for single activity score = 3 points    COGNITIVE STATUS: Within functional limits for tasks assessed     GAIT: 05/23/24 Distance walked: 100' within clinic Assistive device utilized: Single point  cane Level  of assistance: Modified independence Comments: decreased stance on LLE, decr Lt hip/knee flexion  EDEMA: 05/23/24  Knee Joint Line: Rt: 44 cm/Lt: 47.4 cm   LOWER EXTREMITY ROM:     ROM Right eval Left eval  Knee flexion A: 69 A: 88 P: 95  Knee extension A: -5 A: -5 (seated LAQ) P: 0   (Blank rows = not tested)   LOWER EXTREMITY MMT:    05/23/24: Not formally tested; Lt knee grossly 3-/5   MMT Right eval Left eval  Hip flexion    Hip extension    Hip abduction    Hip adduction    Hip internal rotation    Hip external rotation    Knee flexion    Knee extension    Ankle dorsiflexion    Ankle plantarflexion    Ankle inversion    Ankle eversion     (Blank rows = not tested)     TREATMENT:                                                                                                                              DATE:  05/23/24 TherEx See HEP - demonstrated with trial reps performed PRN, min cues for comprehension    Self Care Educated on PT POC, clinical findings, and current progress post op     PATIENT EDUCATION:  Education details: HEP Person educated: Patient Education method: Programmer, multimedia, Facilities manager, and Handouts Education comprehension: verbalized understanding, returned demonstration, and needs further education  HOME EXERCISE PROGRAM: Access Code: [email or web address] URL: [email or web address] Date: 05/23/2024 Prepared by: Corean Ku  Exercises - Supine Heel Slide with Strap  - 5-10 x daily - 7 x weekly - 1 sets - 5-10 reps (with ball) - Quad Set  - 5-10 x daily - 7 x weekly - 1 sets - 5-10 reps - 5 sec hold - Seated Knee Extension AROM  - 3-5 x daily - 7 x weekly - 1 sets - 10 reps - 5 sec hold - Seated Knee Flexion AAROM  - 3-5 x daily - 7 x weekly - 1 sets - 10 reps - 5-10 sec hold   ASSESSMENT:  CLINICAL IMPRESSION: Patient is a 68 y.o. male who was seen today for physical therapy evaluation and treatment for  Lt TKA. He demonstrates decreased ROM, balance, and strength, as well as gait abnormalities and expected post op pain and swelling affecting functional mobility.  He will benefit from PT to address deficits listed.     OBJECTIVE IMPAIRMENTS: Abnormal gait, decreased balance, decreased mobility, difficulty walking, decreased ROM, decreased strength, hypomobility, increased edema, increased muscle spasms, and pain.   ACTIVITY LIMITATIONS: sitting, standing, squatting, sleeping, stairs, transfers, bed mobility, and locomotion level  PARTICIPATION LIMITATIONS: meal prep, cleaning, laundry, driving, shopping, community activity, occupation, and yard work  PERSONAL FACTORS: Age, Past/current experiences, Time since onset of injury/illness/exacerbation, and 3+ comorbidities: anxiety, arthritis, afib, CVA, R TKR, cervical discectomy, depression are also affecting  patient's functional outcome.   REHAB POTENTIAL: Good  CLINICAL DECISION MAKING: Stable/uncomplicated  EVALUATION COMPLEXITY: Low   GOALS: Goals reviewed with patient? Yes  SHORT TERM GOALS: Target date: 06/20/2024  Independent with initial HEP Goal status: INITIAL  2.  Lt knee AROM improved to 0-100 for improved mobility Goal status: INITIAL   LONG TERM GOALS: Target date: 07/18/2024   Independent with final HEP Goal status: INITIAL  2.  PSFS score improved by 3 points Goal status: INITIAL  3.  Lt knee AROM improved to 0-110 for improved mobility and function Goal status: INIITAL  4.  Report pain < 3/10 with standing and walking for improved function Goal status: INITIAL  5.  Amb independently without AD or significant gait abnormalities for improved function and mobility Goal status: INITIAL  PLAN:  PT FREQUENCY: 2x/week  PT DURATION: 8 weeks  PLANNED INTERVENTIONS: 97164- PT Re-evaluation, 97750- Physical Performance Testing, 97110-Therapeutic exercises, 97530- Therapeutic activity, 97112- Neuromuscular  re-education, 97535- Self Care, 02859- Manual therapy, [phone]- Gait training, [phone]- Aquatic Therapy, [phone]- Electrical stimulation (unattended), 97016- Vasopneumatic device, 20560 (1-2 muscles), 20561 (3+ muscles)- Dry Needling, Patient/Family education, Balance training, Stair training, Taping, Joint mobilization, Scar mobilization, DME instructions, Cryotherapy, and Moist heat.  PLAN FOR NEXT SESSION: Review HEP, work on flexion and quad strengthening, light balance exercises   NEXT MD VISIT: 05/30/24   Corean JULIANNA Ku, PT, DPT 05/23/24 1:36 PM    Referring diagnosis? M17.12 Treatment diagnosis? (if different than referring diagnosis) M25.562, M25.662, M62.81, R60.0, R26.81, R26.89 What was this (referring dx) caused by? [x]  Surgery []  Fall []  Ongoing issue [x]  Arthritis []  Other: ____________  Laterality: []  Rt [x]  Lt []  Both  Check all possible CPT codes:  *CHOOSE 10 OR LESS*    See Planned Interventions listed in the Plan section of the Evaluation.

## 2024-05-24 ENCOUNTER — Encounter: Admitting: Rehabilitative and Restorative Service Providers"

## 2024-05-29 ENCOUNTER — Telehealth: Payer: Self-pay | Admitting: Physical Therapy

## 2024-05-29 ENCOUNTER — Encounter: Admitting: Physician Assistant

## 2024-05-29 ENCOUNTER — Encounter: Payer: Self-pay | Admitting: Physical Therapy

## 2024-05-29 ENCOUNTER — Ambulatory Visit (INDEPENDENT_AMBULATORY_CARE_PROVIDER_SITE_OTHER): Admitting: Physician Assistant

## 2024-05-29 DIAGNOSIS — Z96652 Presence of left artificial knee joint: Secondary | ICD-10-CM

## 2024-05-29 MED ORDER — ACETAMINOPHEN-CODEINE 300-30 MG PO TABS: 1.0000 | ORAL_TABLET | Freq: Two times a day (BID) | ORAL | 1 refills | Status: DC | PRN

## 2024-05-29 NOTE — Therapy (Incomplete)
 OUTPATIENT PHYSICAL THERAPY TREATMENT   Patient Name: Cristian Gonzales MRN: [id number] DOB:1956-05-04, 68 y.o., male Today's Date: 05/29/2024  END OF SESSION:    Past Medical History:  Diagnosis Date   Anxiety    Arthritis    Cervical spine fracture (HCC)    13 -diving accident, no neurolgic sequelae   Depression    Paroxysmal atrial fibrillation (HCC)    Pneumonia    x several   Stroke (HCC)    Syncope 11/27/2012   From Tramadol    Past Surgical History:  Procedure Laterality Date   BUBBLE STUDY  03/19/2021   Procedure: BUBBLE STUDY;  Surgeon: Delford Maude BROCKS, MD;  Location: [facility] ENDOSCOPY;  Service: Cardiovascular;;   CERVICAL DISCECTOMY  09/06/2002   dr gaither   EYE SURGERY Bilateral    cataracts removed ([facility])   KNEE SURGERY  09/07/1979   right   LOOP RECORDER INSERTION N/A 03/19/2021   Procedure: LOOP RECORDER INSERTION;  Surgeon: Cindie Ole DASEN, MD;  Location: [facility] INVASIVE CV LAB;  Service: Cardiovascular;  Laterality: N/A;   LUMBAR LAMINECTOMY/DECOMPRESSION MICRODISCECTOMY N/A 07/27/2023   Procedure: Thoracic Eleven-Twelve Decompression;  Surgeon: Onetha Kuba, MD;  Location: [facility] OR;  Service: Neurosurgery;  Laterality: N/A;   NECK SURGERY     REPLACEMENT TOTAL KNEE     right   REPLACEMENT TOTAL KNEE     TEE WITHOUT CARDIOVERSION N/A 03/19/2021   Procedure: TRANSESOPHAGEAL ECHOCARDIOGRAM (TEE);  Surgeon: Delford Maude BROCKS, MD;  Location: [facility] ENDOSCOPY;  Service: Cardiovascular;  Laterality: N/A;   TOTAL KNEE ARTHROPLASTY Left 05/14/2024   Procedure: ARTHROPLASTY, KNEE, TOTAL;  Surgeon: Jerri Kay CHRISTELLA, MD;  Location: [facility] OR;  Service: Orthopedics;  Laterality: Left;   Patient Active Problem List   Diagnosis Date Noted   Status post total left knee replacement 05/14/2024   Primary osteoarthritis of left knee 02/07/2024   Body mass index 40.0-44.9, adult (HCC) 02/07/2024   Myelopathy (HCC) 07/27/2023   Spinal stenosis of lumbar region 07/25/2023   Cord  compression myelopathy (HCC) 07/25/2023   Paroxysmal atrial fibrillation (HCC) 07/25/2023   Essential hypertension 07/25/2023   Obesity (BMI 30-39.9) 07/25/2023   Atrial fibrillation with RVR (HCC) 02/07/2023   Elevated troponin 02/07/2023   Hypokalemia 02/07/2023   SIRS (systemic inflammatory response syndrome) (HCC) 02/07/2023   Pulmonary nodule 02/07/2023   Chronic diastolic CHF (congestive heart failure) (HCC) 02/07/2023   Chest pain 02/06/2023   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   Hyperlipidemia 07/30/2015   Lumbar disc disease with radiculopathy 07/29/2014   Left-sided thoracic back pain 06/05/2014   Numbness and tingling sensation of skin 06/25/2013   Syncope 11/27/2012   Depression 11/27/2012   Epididymitis, left 11/23/2010   Allergic rhinitis 11/07/2009   Extrinsic asthma 02/05/2008   Anxiety state 08/08/2007    PCP: Merna Huxley, NP  REFERRING PROVIDER: Jerri Kay CHRISTELLA, MD  REFERRING DIAG: [phone] (ICD-10-CM) - Primary osteoarthritis of left knee   Rationale for Evaluation and Treatment: Rehabilitation  THERAPY DIAG:  No diagnosis found.  ONSET DATE: 05/14/24   SUBJECTIVE:  SUBJECTIVE STATEMENT: ***  Pt is s/p Lt TKA on 05/14/24. He is amb with a SPC at this time.  PERTINENT HISTORY:  anxiety, arthritis, afib, CVA, R TKR, cervical discectomy, depression  PAIN:  Are you having pain? *** Yes: NPRS scale: 5 currently, up to 10, at best 5/10 Pain location: Lt knee Pain description: sharp, aching, sore, deep Aggravating factors: standing, walking Relieving factors: sitting, resting  PRECAUTIONS:  None  RED FLAGS: None   WEIGHT BEARING RESTRICTIONS:  No  FALLS:  Has patient fallen in last 6 months? No  LIVING ENVIRONMENT: Lives with: lives with their spouse Lives in:  House/apartment Stairs: Yes: External: 1 steps; none Has following equipment at home: Single point cane and Walker - 2 wheeled  OCCUPATION:  Retired - Set designer  PLOF:  Independent and Leisure: smoke cigars, go fishing, belongs to [employer] - goes 4 days a week; walks dog 1/2 mile every morning  PATIENT GOALS:  Improve pain, flexion in knee   OBJECTIVE:  Note: Objective measures were completed at Evaluation unless otherwise noted.   PATIENT SURVEYS:  Patient-Specific Activity Scoring Scheme  0 represents "unable to perform." 10 represents "able to perform at prior level. 0 1 2 3 4 5 6 7 8 9  10 (Date and Score)   Activity Eval     1. Walking 3     2. Bending knee  3    3. Fishing 3   4. Cigar lounge 3   5. Going out to dinner 3   Score 3    Total score = sum of the activity scores/number of activities Minimum detectable change (90%CI) for average score = 2 points Minimum detectable change (90%CI) for single activity score = 3 points    COGNITIVE STATUS: Within functional limits for tasks assessed     GAIT: 05/23/24 Distance walked: 100' within clinic Assistive device utilized: Single point cane Level of assistance: Modified independence Comments: decreased stance on LLE, decr Lt hip/knee flexion  EDEMA: 05/23/24  Knee Joint Line: Rt: 44 cm/Lt: 47.4 cm   LOWER EXTREMITY ROM:     ROM Right eval Left eval  Knee flexion A: 69 A: 88 P: 95  Knee extension A: -5 A: -5 (seated LAQ) P: 0   (Blank rows = not tested)   LOWER EXTREMITY MMT:    05/23/24: Not formally tested; Lt knee grossly 3-/5   MMT Right eval Left eval  Hip flexion    Hip extension    Hip abduction    Hip adduction    Hip internal rotation    Hip external rotation    Knee flexion    Knee extension    Ankle dorsiflexion    Ankle plantarflexion    Ankle inversion    Ankle eversion     (Blank rows = not tested)    TREATMENT:  DATE:  05/29/2024: Therapeutic Exercise: ***    TREATMENT:                                                                                                                              DATE:  05/23/24 TherEx See HEP - demonstrated with trial reps performed PRN, min cues for comprehension    Self Care Educated on PT POC, clinical findings, and current progress post op     PATIENT EDUCATION:  Education details: HEP Person educated: Patient Education method: Programmer, multimedia, Facilities manager, and Handouts Education comprehension: verbalized understanding, returned demonstration, and needs further education  HOME EXERCISE PROGRAM: Access Code: [email or web address] URL: [email or web address] Date: 05/23/2024 Prepared by: Corean Ku  Exercises - Supine Heel Slide with Strap  - 5-10 x daily - 7 x weekly - 1 sets - 5-10 reps (with ball) - Quad Set  - 5-10 x daily - 7 x weekly - 1 sets - 5-10 reps - 5 sec hold - Seated Knee Extension AROM  - 3-5 x daily - 7 x weekly - 1 sets - 10 reps - 5 sec hold - Seated Knee Flexion AAROM  - 3-5 x daily - 7 x weekly - 1 sets - 10 reps - 5-10 sec hold   ASSESSMENT:  CLINICAL IMPRESSION: ***   Patient is a 68 y.o. male who was seen today for physical therapy evaluation and treatment for Lt TKA. He demonstrates decreased ROM, balance, and strength, as well as gait abnormalities and expected post op pain and swelling affecting functional mobility.  He will benefit from PT to address deficits listed.   OBJECTIVE IMPAIRMENTS: Abnormal gait, decreased balance, decreased mobility, difficulty walking, decreased ROM, decreased strength, hypomobility, increased edema, increased muscle spasms, and pain.   ACTIVITY LIMITATIONS: sitting, standing, squatting, sleeping, stairs, transfers, bed mobility, and locomotion level  PARTICIPATION LIMITATIONS: meal prep, cleaning, laundry,  driving, shopping, community activity, occupation, and yard work  PERSONAL FACTORS: Age, Past/current experiences, Time since onset of injury/illness/exacerbation, and 3+ comorbidities: anxiety, arthritis, afib, CVA, R TKR, cervical discectomy, depression are also affecting patient's functional outcome.   REHAB POTENTIAL: Good  CLINICAL DECISION MAKING: Stable/uncomplicated  EVALUATION COMPLEXITY: Low   GOALS: Goals reviewed with patient? Yes  SHORT TERM GOALS: Target date: 06/20/2024  Independent with initial HEP Goal status: Ongoing   05/29/2024  2.  Lt knee AROM improved to 0-100 for improved mobility Goal status: Ongoing   05/29/2024   LONG TERM GOALS: Target date: 07/18/2024   Independent with final HEP Goal status: Ongoing   05/29/2024  2.  PSFS score improved by 3 points Goal status: Ongoing   05/29/2024  3.  Lt knee AROM improved to 0-110 for improved mobility and function Goal status:  Ongoing   05/29/2024  4.  Report pain < 3/10 with standing and walking for improved function Goal status: Ongoing   05/29/2024  5.  Amb independently without AD or significant gait abnormalities for improved function and  mobility Goal status: Ongoing   05/29/2024  PLAN:  PT FREQUENCY: 2x/week  PT DURATION: 8 weeks  PLANNED INTERVENTIONS: 97164- PT Re-evaluation, 97750- Physical Performance Testing, 97110-Therapeutic exercises, 97530- Therapeutic activity, 97112- Neuromuscular re-education, 97535- Self Care, 02859- Manual therapy, [phone]- Gait training, [phone]- Aquatic Therapy, [phone]- Electrical stimulation (unattended), 97016- Vasopneumatic device, [phone] (1-2 muscles), 20561 (3+ muscles)- Dry Needling, Patient/Family education, Balance training, Stair training, Taping, Joint mobilization, Scar mobilization, DME instructions, Cryotherapy, and Moist heat.  PLAN FOR NEXT SESSION: ***   Review HEP, work on flexion and quad strengthening, light balance exercises   NEXT MD VISIT:  05/30/24   Grayce Spatz, PT, DPT 05/29/2024, 7:09 AM   Referring diagnosis? M17.12 Treatment diagnosis? (if different than referring diagnosis) M25.562, M25.662, M62.81, R60.0, R26.81, R26.89 What was this (referring dx) caused by? [x]  Surgery []  Fall []  Ongoing issue [x]  Arthritis []  Other: ____________  Laterality: []  Rt [x]  Lt []  Both  Check all possible CPT codes:  *CHOOSE 10 OR LESS*    See Planned Interventions listed in the Plan section of the Evaluation.

## 2024-05-29 NOTE — Telephone Encounter (Signed)
 PT called to advise patient of appointment today.  He was not aware that he had a PT appointment following his PA office visit.  His wife / transportation has already left so unable to take alternate time.

## 2024-05-29 NOTE — Progress Notes (Signed)
 Post-Op Visit Note   Patient: Cristian Gonzales           Date of Birth: 04-03-1956           MRN: [id number] Visit Date: 05/29/2024 PCP: Merna Huxley, NP   Assessment & Plan:  Chief Complaint:  Chief Complaint  Patient presents with   Left Knee - Routine Post Op    05/14/2024- L TKA   Visit Diagnoses:  1. Status post total left knee replacement     Plan: Patient is a pleasant 68 year old gentleman who comes in today 2 weeks status post left total knee replacement 05/14/2024.  His main issue has been not being able to sleep which she says is not due to pain.  He thinks this may be due to withdrawing from the pain medication.  He is only taking Tylenol  for pain.  He is on chronic Eliquis .  He is in outpatient physical therapy currently ambulating with a single-point cane.  Examination of his left knee reveals well-healing surgical incision with nylon sutures in place.  No evidence of infection or cellulitis.  Calves are soft nontender.  He is neurovascularly intact distally.  Today, sutures were removed and Steri-Strips applied.  He will continue with physical therapy.  We have discussed trying Tylenol  3 for which she is agreeable to.  I sent this into his pharmacy.  He will follow-up in 4 weeks for repeat evaluation and 2 view x-rays of the left knee.  Call with concerns or questions.  Follow-Up Instructions: Return in about 4 weeks (around 06/26/2024).   Orders:  No orders of the defined types were placed in this encounter.  Meds ordered this encounter  Medications   acetaminophen -codeine  (TYLENOL  #3) 300-30 MG tablet    Sig: Take 1 tablet by mouth 2 (two) times daily as needed.    Dispense:  30 tablet    Refill:  1    Imaging: No new imaging  PMFS History: Patient Active Problem List   Diagnosis Date Noted   Status post total left knee replacement 05/14/2024   Primary osteoarthritis of left knee 02/07/2024   Body mass index 40.0-44.9, adult (HCC) 02/07/2024   Myelopathy  (HCC) 07/27/2023   Spinal stenosis of lumbar region 07/25/2023   Cord compression myelopathy (HCC) 07/25/2023   Paroxysmal atrial fibrillation (HCC) 07/25/2023   Essential hypertension 07/25/2023   Obesity (BMI 30-39.9) 07/25/2023   Atrial fibrillation with RVR (HCC) 02/07/2023   Elevated troponin 02/07/2023   Hypokalemia 02/07/2023   SIRS (systemic inflammatory response syndrome) (HCC) 02/07/2023   Pulmonary nodule 02/07/2023   Chronic diastolic CHF (congestive heart failure) (HCC) 02/07/2023   Chest pain 02/06/2023   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   Hyperlipidemia 07/30/2015   Lumbar disc disease with radiculopathy 07/29/2014   Left-sided thoracic back pain 06/05/2014   Numbness and tingling sensation of skin 06/25/2013   Syncope 11/27/2012   Depression 11/27/2012   Epididymitis, left 11/23/2010   Allergic rhinitis 11/07/2009   Extrinsic asthma 02/05/2008   Anxiety state 08/08/2007   Past Medical History:  Diagnosis Date   Anxiety    Arthritis    Cervical spine fracture (HCC)    13 -diving accident, no neurolgic sequelae   Depression    Paroxysmal atrial fibrillation (HCC)    Pneumonia    x several   Stroke (HCC)    Syncope 11/27/2012   From Tramadol     Family History  Problem Relation Age of Onset   Heart attack Father 65  Sudden Cardiac Death   Liver cancer Mother 14    Past Surgical History:  Procedure Laterality Date   BUBBLE STUDY  03/19/2021   Procedure: BUBBLE STUDY;  Surgeon: Delford Maude BROCKS, MD;  Location: [facility] ENDOSCOPY;  Service: Cardiovascular;;   CERVICAL DISCECTOMY  09/06/2002   dr gaither   EYE SURGERY Bilateral    cataracts removed ([facility])   KNEE SURGERY  09/07/1979   right   LOOP RECORDER INSERTION N/A 03/19/2021   Procedure: LOOP RECORDER INSERTION;  Surgeon: Cindie Ole DASEN, MD;  Location: [facility] INVASIVE CV LAB;  Service: Cardiovascular;  Laterality: N/A;   LUMBAR LAMINECTOMY/DECOMPRESSION MICRODISCECTOMY N/A 07/27/2023    Procedure: Thoracic Eleven-Twelve Decompression;  Surgeon: Onetha Kuba, MD;  Location: [facility] OR;  Service: Neurosurgery;  Laterality: N/A;   NECK SURGERY     REPLACEMENT TOTAL KNEE     right   REPLACEMENT TOTAL KNEE     TEE WITHOUT CARDIOVERSION N/A 03/19/2021   Procedure: TRANSESOPHAGEAL ECHOCARDIOGRAM (TEE);  Surgeon: Delford Maude BROCKS, MD;  Location: [facility] ENDOSCOPY;  Service: Cardiovascular;  Laterality: N/A;   TOTAL KNEE ARTHROPLASTY Left 05/14/2024   Procedure: ARTHROPLASTY, KNEE, TOTAL;  Surgeon: Jerri Kay HERO, MD;  Location: [facility] OR;  Service: Orthopedics;  Laterality: Left;   Social History   Occupational History   Not on file  Tobacco Use   Smoking status: Some Days    Types: Cigars   Smokeless tobacco: Never   Tobacco comments:    Smokes 1 cigar twice weekly.  Vaping Use   Vaping status: Never Used  Substance and Sexual Activity   Alcohol use: Yes    Comment: occasional 6 pk beer   Drug use: No    Comment: Marijuana w/THC drinks taken qhs to help with sleep; informed to withhold 24 hrs prior to procedure   Sexual activity: Not Currently

## 2024-05-30 ENCOUNTER — Encounter: Admitting: Physician Assistant

## 2024-05-30 NOTE — Therapy (Signed)
 OUTPATIENT PHYSICAL THERAPY TREATMENT   Patient Name: Cristian Gonzales MRN: [id number] DOB:1956-03-24, 68 y.o., male Today's Date: 06/01/2024  END OF SESSION:  PT End of Session - 06/01/24 0930     Visit Number 2    Number of Visits 16    Date for Recertification  07/18/24    Authorization Type [employer] $25 copay    Progress Note Due on Visit 10    PT Start Time 0845    PT Stop Time 0923    PT Time Calculation (min) 38 min    Activity Tolerance Patient tolerated treatment well    Behavior During Therapy [facility] for tasks assessed/performed           Past Medical History:  Diagnosis Date   Anxiety    Arthritis    Cervical spine fracture (HCC)    13 -diving accident, no neurolgic sequelae   Depression    Paroxysmal atrial fibrillation (HCC)    Pneumonia    x several   Stroke (HCC)    Syncope 11/27/2012   From Tramadol    Past Surgical History:  Procedure Laterality Date   BUBBLE STUDY  03/19/2021   Procedure: BUBBLE STUDY;  Surgeon: Delford Maude BROCKS, MD;  Location: [facility] ENDOSCOPY;  Service: Cardiovascular;;   CERVICAL DISCECTOMY  09/06/2002   dr gaither   EYE SURGERY Bilateral    cataracts removed ([facility])   KNEE SURGERY  09/07/1979   right   LOOP RECORDER INSERTION N/A 03/19/2021   Procedure: LOOP RECORDER INSERTION;  Surgeon: Cindie Ole DASEN, MD;  Location: [facility] INVASIVE CV LAB;  Service: Cardiovascular;  Laterality: N/A;   LUMBAR LAMINECTOMY/DECOMPRESSION MICRODISCECTOMY N/A 07/27/2023   Procedure: Thoracic Eleven-Twelve Decompression;  Surgeon: Onetha Kuba, MD;  Location: [facility] OR;  Service: Neurosurgery;  Laterality: N/A;   NECK SURGERY     REPLACEMENT TOTAL KNEE     right   REPLACEMENT TOTAL KNEE     TEE WITHOUT CARDIOVERSION N/A 03/19/2021   Procedure: TRANSESOPHAGEAL ECHOCARDIOGRAM (TEE);  Surgeon: Delford Maude BROCKS, MD;  Location: [facility] ENDOSCOPY;  Service: Cardiovascular;  Laterality: N/A;   TOTAL KNEE ARTHROPLASTY Left 05/14/2024   Procedure:  ARTHROPLASTY, KNEE, TOTAL;  Surgeon: Jerri Kay CHRISTELLA, MD;  Location: [facility] OR;  Service: Orthopedics;  Laterality: Left;   Patient Active Problem List   Diagnosis Date Noted   Status post total left knee replacement 05/14/2024   Primary osteoarthritis of left knee 02/07/2024   Body mass index 40.0-44.9, adult (HCC) 02/07/2024   Myelopathy (HCC) 07/27/2023   Spinal stenosis of lumbar region 07/25/2023   Cord compression myelopathy (HCC) 07/25/2023   Paroxysmal atrial fibrillation (HCC) 07/25/2023   Essential hypertension 07/25/2023   Obesity (BMI 30-39.9) 07/25/2023   Atrial fibrillation with RVR (HCC) 02/07/2023   Elevated troponin 02/07/2023   Hypokalemia 02/07/2023   SIRS (systemic inflammatory response syndrome) (HCC) 02/07/2023   Pulmonary nodule 02/07/2023   Chronic diastolic CHF (congestive heart failure) (HCC) 02/07/2023   Chest pain 02/06/2023   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   Hyperlipidemia 07/30/2015   Lumbar disc disease with radiculopathy 07/29/2014   Left-sided thoracic back pain 06/05/2014   Numbness and tingling sensation of skin 06/25/2013   Syncope 11/27/2012   Depression 11/27/2012   Epididymitis, left 11/23/2010   Allergic rhinitis 11/07/2009   Extrinsic asthma 02/05/2008   Anxiety state 08/08/2007    PCP: Merna Huxley, NP  REFERRING PROVIDER: Jerri Kay CHRISTELLA, MD  REFERRING DIAG: [phone] (ICD-10-CM) - Primary osteoarthritis of left knee  Rationale for Evaluation and Treatment: Rehabilitation  THERAPY DIAG:  Acute pain of left knee  Stiffness of left knee, not elsewhere classified  Muscle weakness (generalized)  Localized edema  Unsteadiness on feet  Other abnormalities of gait and mobility  ONSET DATE: 05/14/24   SUBJECTIVE:                                                                                                                                                                                           SUBJECTIVE STATEMENT: Pt reports  still having aching pain above the knee but tolerable.  PERTINENT HISTORY:  anxiety, arthritis, afib, CVA, R TKR, cervical discectomy, depression  PAIN:  Are you having pain?  Yes: NPRS scale: 5/10 Pain location: Lt knee Pain description: sharp, aching, sore, deep Aggravating factors: standing, walking Relieving factors: sitting, resting  PRECAUTIONS:  None  RED FLAGS: None   WEIGHT BEARING RESTRICTIONS:  No  FALLS:  Has patient fallen in last 6 months? No  LIVING ENVIRONMENT: Lives with: lives with their spouse Lives in: House/apartment Stairs: Yes: External: 1 steps; none Has following equipment at home: Single point cane and Walker - 2 wheeled  OCCUPATION:  Retired - Set designer  PLOF:  Independent and Leisure: smoke cigars, go fishing, belongs to [employer] - goes 4 days a week; walks dog 1/2 mile every morning  PATIENT GOALS:  Improve pain, flexion in knee   OBJECTIVE:  Note: Objective measures were completed at Evaluation unless otherwise noted.   PATIENT SURVEYS:  Patient-Specific Activity Scoring Scheme  0 represents "unable to perform." 10 represents "able to perform at prior level. 0 1 2 3 4 5 6 7 8 9  10 (Date and Score)   Activity Eval     1. Walking 3     2. Bending knee  3    3. Fishing 3   4. Cigar lounge 3   5. Going out to dinner 3   Score 3    Total score = sum of the activity scores/number of activities Minimum detectable change (90%CI) for average score = 2 points Minimum detectable change (90%CI) for single activity score = 3 points    COGNITIVE STATUS: Within functional limits for tasks assessed     GAIT: 05/23/24 Distance walked: 100' within clinic Assistive device utilized: Single point cane Level of assistance: Modified independence Comments: decreased stance on LLE, decr Lt hip/knee flexion  EDEMA: 05/23/24  Knee Joint Line: Rt: 44 cm/Lt: 47.4 cm   LOWER EXTREMITY ROM:     ROM Right eval Left eval  Left 06/01/24  Knee flexion A: 69 A: 88 P: 95 100/103  Knee  extension A: -5 A: -5 (seated LAQ) P: 0    (Blank rows = not tested)   LOWER EXTREMITY MMT:    05/23/24: Not formally tested; Lt knee grossly 3-/5   MMT Right eval Left eval  Hip flexion    Hip extension    Hip abduction    Hip adduction    Hip internal rotation    Hip external rotation    Knee flexion    Knee extension    Ankle dorsiflexion    Ankle plantarflexion    Ankle inversion    Ankle eversion     (Blank rows = not tested)    TREATMENT:   L TKA                                                                                                                      DATE:  06/01/2024 There Ex Nustep level 3  6 min  Seated knee ext 2x10 Seated heel slides 2x10 Supine knee flexion with ball and strap 3x10 Supine quad sets 2x10 SLR 2x10 There Act for transfers Seated knee flexion with over pressure by PT 20x Seated marching  2x20 Manual: PROM, hamstring stretching 3x25 sec   TREATMENT:                                                                                                                              DATE:  05/23/24 TherEx See HEP - demonstrated with trial reps performed PRN, min cues for comprehension    Self Care Educated on PT POC, clinical findings, and current progress post op     PATIENT EDUCATION:  Education details: HEP Person educated: Patient Education method: Programmer, multimedia, Facilities manager, and Handouts Education comprehension: verbalized understanding, returned demonstration, and needs further education  HOME EXERCISE PROGRAM: Access Code: [email or web address] URL: [email or web address] Date: 05/23/2024 Prepared by: Corean Ku  Exercises - Supine Heel Slide with Strap  - 5-10 x daily - 7 x weekly - 1 sets - 5-10 reps (with ball) - Quad Set  - 5-10 x daily - 7 x weekly - 1 sets - 5-10 reps - 5 sec hold - Seated Knee Extension AROM  - 3-5 x daily - 7 x weekly - 1  sets - 10 reps - 5 sec hold - Seated Knee Flexion AAROM  - 3-5 x daily - 7 x weekly - 1 sets - 10 reps - 5-10 sec hold   ASSESSMENT:  CLINICAL IMPRESSION:  Patient  demonstrates good understanding of HEP.  Needed Vc for new exercises.  Increased knee flexion. OBJECTIVE IMPAIRMENTS: Abnormal gait, decreased balance, decreased mobility, difficulty walking, decreased ROM, decreased strength, hypomobility, increased edema, increased muscle spasms, and pain.   ACTIVITY LIMITATIONS: sitting, standing, squatting, sleeping, stairs, transfers, bed mobility, and locomotion level  PARTICIPATION LIMITATIONS: meal prep, cleaning, laundry, driving, shopping, community activity, occupation, and yard work  PERSONAL FACTORS: Age, Past/current experiences, Time since onset of injury/illness/exacerbation, and 3+ comorbidities: anxiety, arthritis, afib, CVA, R TKR, cervical discectomy, depression are also affecting patient's functional outcome.   REHAB POTENTIAL: Good  CLINICAL DECISION MAKING: Stable/uncomplicated  EVALUATION COMPLEXITY: Low   GOALS: Goals reviewed with patient? Yes  SHORT TERM GOALS: Target date: 06/20/2024  Independent with initial HEP Goal status: Ongoing   05/29/2024  2.  Lt knee AROM improved to 0-100 for improved mobility Goal status: Ongoing   05/29/2024   LONG TERM GOALS: Target date: 07/18/2024   Independent with final HEP Goal status: Ongoing   05/29/2024  2.  PSFS score improved by 3 points Goal status: Ongoing   05/29/2024  3.  Lt knee AROM improved to 0-110 for improved mobility and function Goal status:  Ongoing   05/29/2024  4.  Report pain < 3/10 with standing and walking for improved function Goal status: Ongoing   05/29/2024  5.  Amb independently without AD or significant gait abnormalities for improved function and mobility Goal status: Ongoing   05/29/2024  PLAN:  PT FREQUENCY: 2x/week  PT DURATION: 8 weeks  PLANNED INTERVENTIONS: 97164- PT  Re-evaluation, 97750- Physical Performance Testing, 97110-Therapeutic exercises, 97530- Therapeutic activity, 97112- Neuromuscular re-education, 97535- Self Care, 02859- Manual therapy, [phone]- Gait training, [phone]- Aquatic Therapy, [phone]- Electrical stimulation (unattended), 97016- Vasopneumatic device, 20560 (1-2 muscles), 20561 (3+ muscles)- Dry Needling, Patient/Family education, Balance training, Stair training, Taping, Joint mobilization, Scar mobilization, DME instructions, Cryotherapy, and Moist heat.  PLAN FOR NEXT SESSION:    light balance exercises   NEXT MD VISIT: 05/30/24   Burnard CHRISTELLA Meth, PT, DPT 06/01/2024, 9:31 AM   Referring diagnosis? M17.12 Treatment diagnosis? (if different than referring diagnosis) M25.562, M25.662, M62.81, R60.0, R26.81, R26.89 What was this (referring dx) caused by? [x]  Surgery []  Fall []  Ongoing issue [x]  Arthritis []  Other: ____________  Laterality: []  Rt [x]  Lt []  Both  Check all possible CPT codes:  *CHOOSE 10 OR LESS*    See Planned Interventions listed in the Plan section of the Evaluation.

## 2024-06-01 ENCOUNTER — Ambulatory Visit

## 2024-06-01 DIAGNOSIS — M25562 Pain in left knee: Secondary | ICD-10-CM | POA: Diagnosis not present

## 2024-06-01 DIAGNOSIS — M6281 Muscle weakness (generalized): Secondary | ICD-10-CM | POA: Diagnosis not present

## 2024-06-01 DIAGNOSIS — M25662 Stiffness of left knee, not elsewhere classified: Secondary | ICD-10-CM

## 2024-06-01 DIAGNOSIS — R2689 Other abnormalities of gait and mobility: Secondary | ICD-10-CM

## 2024-06-01 DIAGNOSIS — R2681 Unsteadiness on feet: Secondary | ICD-10-CM

## 2024-06-01 DIAGNOSIS — R600 Localized edema: Secondary | ICD-10-CM | POA: Diagnosis not present

## 2024-06-05 ENCOUNTER — Encounter: Payer: Self-pay | Admitting: Physical Therapy

## 2024-06-05 ENCOUNTER — Ambulatory Visit: Admitting: Physical Therapy

## 2024-06-05 DIAGNOSIS — R2681 Unsteadiness on feet: Secondary | ICD-10-CM | POA: Diagnosis not present

## 2024-06-05 DIAGNOSIS — M25562 Pain in left knee: Secondary | ICD-10-CM | POA: Diagnosis not present

## 2024-06-05 DIAGNOSIS — R2689 Other abnormalities of gait and mobility: Secondary | ICD-10-CM

## 2024-06-05 DIAGNOSIS — M25662 Stiffness of left knee, not elsewhere classified: Secondary | ICD-10-CM | POA: Diagnosis not present

## 2024-06-05 DIAGNOSIS — R600 Localized edema: Secondary | ICD-10-CM | POA: Diagnosis not present

## 2024-06-05 DIAGNOSIS — M6281 Muscle weakness (generalized): Secondary | ICD-10-CM | POA: Diagnosis not present
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
## 2024-06-07 ENCOUNTER — Encounter

## 2024-06-07 DIAGNOSIS — M4714 Other spondylosis with myelopathy, thoracic region: Secondary | ICD-10-CM | POA: Diagnosis not present

## 2024-06-07 DIAGNOSIS — M5440 Lumbago with sciatica, unspecified side: Secondary | ICD-10-CM | POA: Diagnosis not present

## 2024-06-07 DIAGNOSIS — M4317 Spondylolisthesis, lumbosacral region: Secondary | ICD-10-CM | POA: Diagnosis not present

## 2024-06-07 DIAGNOSIS — M5416 Radiculopathy, lumbar region: Secondary | ICD-10-CM | POA: Diagnosis not present
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
## 2024-06-08 ENCOUNTER — Encounter: Payer: Self-pay | Admitting: Physical Therapy

## 2024-06-08 ENCOUNTER — Ambulatory Visit (INDEPENDENT_AMBULATORY_CARE_PROVIDER_SITE_OTHER): Admitting: Physical Therapy

## 2024-06-08 DIAGNOSIS — R600 Localized edema: Secondary | ICD-10-CM

## 2024-06-08 DIAGNOSIS — R2681 Unsteadiness on feet: Secondary | ICD-10-CM

## 2024-06-08 DIAGNOSIS — M25562 Pain in left knee: Secondary | ICD-10-CM | POA: Diagnosis not present

## 2024-06-08 DIAGNOSIS — M6281 Muscle weakness (generalized): Secondary | ICD-10-CM

## 2024-06-08 DIAGNOSIS — R2689 Other abnormalities of gait and mobility: Secondary | ICD-10-CM

## 2024-06-08 DIAGNOSIS — M25662 Stiffness of left knee, not elsewhere classified: Secondary | ICD-10-CM | POA: Diagnosis not present

## 2024-06-12 ENCOUNTER — Ambulatory Visit: Admitting: Physical Therapy

## 2024-06-12 ENCOUNTER — Encounter: Payer: Self-pay | Admitting: Physical Therapy

## 2024-06-12 DIAGNOSIS — R2681 Unsteadiness on feet: Secondary | ICD-10-CM

## 2024-06-12 DIAGNOSIS — R600 Localized edema: Secondary | ICD-10-CM | POA: Diagnosis not present

## 2024-06-12 DIAGNOSIS — M25662 Stiffness of left knee, not elsewhere classified: Secondary | ICD-10-CM

## 2024-06-12 DIAGNOSIS — M25562 Pain in left knee: Secondary | ICD-10-CM | POA: Diagnosis not present

## 2024-06-12 DIAGNOSIS — M6281 Muscle weakness (generalized): Secondary | ICD-10-CM | POA: Diagnosis not present

## 2024-06-12 DIAGNOSIS — R2689 Other abnormalities of gait and mobility: Secondary | ICD-10-CM | POA: Diagnosis not present

## 2024-06-12 NOTE — Therapy (Signed)
 OUTPATIENT PHYSICAL THERAPY TREATMENT   Patient Name: TASHA JINDRA MRN: [id number] DOB:November 20, 1955, 68 y.o., male Today's Date: 06/12/2024  END OF SESSION:  PT End of Session - 06/12/24 1514     Visit Number 5    Number of Visits 16    Date for Recertification  07/18/24    Authorization Type [employer] $25 copay    Progress Note Due on Visit 10    PT Start Time 1514    PT Stop Time 1610    PT Time Calculation (min) 56 min    Activity Tolerance Patient tolerated treatment well    Behavior During Therapy WFL for tasks assessed/performed              Past Medical History:  Diagnosis Date   Anxiety    Arthritis    Cervical spine fracture (HCC)    13 -diving accident, no neurolgic sequelae   Depression    Paroxysmal atrial fibrillation (HCC)    Pneumonia    x several   Stroke (HCC)    Syncope 11/27/2012   From Tramadol    Past Surgical History:  Procedure Laterality Date   BUBBLE STUDY  03/19/2021   Procedure: BUBBLE STUDY;  Surgeon: Delford Maude BROCKS, MD;  Location: [facility] ENDOSCOPY;  Service: Cardiovascular;;   CERVICAL DISCECTOMY  09/06/2002   dr gaither   EYE SURGERY Bilateral    cataracts removed ([facility])   KNEE SURGERY  09/07/1979   right   LOOP RECORDER INSERTION N/A 03/19/2021   Procedure: LOOP RECORDER INSERTION;  Surgeon: Cindie Ole DASEN, MD;  Location: [facility] INVASIVE CV LAB;  Service: Cardiovascular;  Laterality: N/A;   LUMBAR LAMINECTOMY/DECOMPRESSION MICRODISCECTOMY N/A 07/27/2023   Procedure: Thoracic Eleven-Twelve Decompression;  Surgeon: Onetha Kuba, MD;  Location: [facility] OR;  Service: Neurosurgery;  Laterality: N/A;   NECK SURGERY     REPLACEMENT TOTAL KNEE     right   REPLACEMENT TOTAL KNEE     TEE WITHOUT CARDIOVERSION N/A 03/19/2021   Procedure: TRANSESOPHAGEAL ECHOCARDIOGRAM (TEE);  Surgeon: Delford Maude BROCKS, MD;  Location: [facility] ENDOSCOPY;  Service: Cardiovascular;  Laterality: N/A;   TOTAL KNEE ARTHROPLASTY Left 05/14/2024   Procedure:  ARTHROPLASTY, KNEE, TOTAL;  Surgeon: Jerri Kay CHRISTELLA, MD;  Location: [facility] OR;  Service: Orthopedics;  Laterality: Left;   Patient Active Problem List   Diagnosis Date Noted   Status post total left knee replacement 05/14/2024   Primary osteoarthritis of left knee 02/07/2024   Body mass index 40.0-44.9, adult (HCC) 02/07/2024   Myelopathy (HCC) 07/27/2023   Spinal stenosis of lumbar region 07/25/2023   Cord compression myelopathy (HCC) 07/25/2023   Paroxysmal atrial fibrillation (HCC) 07/25/2023   Essential hypertension 07/25/2023   Obesity (BMI 30-39.9) 07/25/2023   Atrial fibrillation with RVR (HCC) 02/07/2023   Elevated troponin 02/07/2023   Hypokalemia 02/07/2023   SIRS (systemic inflammatory response syndrome) (HCC) 02/07/2023   Pulmonary nodule 02/07/2023   Chronic diastolic CHF (congestive heart failure) (HCC) 02/07/2023   Chest pain 02/06/2023   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   Hyperlipidemia 07/30/2015   Lumbar disc disease with radiculopathy 07/29/2014   Left-sided thoracic back pain 06/05/2014   Numbness and tingling sensation of skin 06/25/2013   Syncope 11/27/2012   Depression 11/27/2012   Epididymitis, left 11/23/2010   Allergic rhinitis 11/07/2009   Extrinsic asthma 02/05/2008   Anxiety state 08/08/2007    PCP: Merna Huxley, NP  REFERRING PROVIDER: Jerri Kay CHRISTELLA, MD  REFERRING DIAG: [phone] (ICD-10-CM) - Primary osteoarthritis of  left knee   Rationale for Evaluation and Treatment: Rehabilitation  THERAPY DIAG:  Acute pain of left knee  Stiffness of left knee, not elsewhere classified  Muscle weakness (generalized)  Localized edema  Unsteadiness on feet  Other abnormalities of gait and mobility  ONSET DATE: 05/14/24 left TKA   SUBJECTIVE:                                                                                                                                                                                           SUBJECTIVE  STATEMENT: His left knee gives out some.  They picked up CPM.  He is only getting 1.5 - 3 hours sleep due to pain.    PERTINENT HISTORY:  Left TKA 05/14/24, anxiety, arthritis, afib, CVA, R TKR, cervical discectomy, depression  PAIN:  Are you having pain?   Yes: NPRS scale: did not rate, but reports elevated Pain location: Lt knee Pain description: sharp, aching, sore, deep Aggravating factors: standing, walking Relieving factors: sitting, resting  PRECAUTIONS:  None  RED FLAGS: None   WEIGHT BEARING RESTRICTIONS:  No  FALLS:  Has patient fallen in last 6 months? No  LIVING ENVIRONMENT: Lives with: lives with their spouse Lives in: House/apartment Stairs: Yes: External: 1 steps; none Has following equipment at home: Single point cane and Walker - 2 wheeled  OCCUPATION:  Retired - Set designer  PLOF:  Independent and Leisure: smoke cigars, go fishing, belongs to [employer] - goes 4 days a week; walks dog 1/2 mile every morning  PATIENT GOALS:  Improve pain, flexion in knee   OBJECTIVE:  Note: Objective measures were completed at Evaluation unless otherwise noted.   PATIENT SURVEYS:  Patient-Specific Activity Scoring Scheme  0 represents "unable to perform." 10 represents "able to perform at prior level. 0 1 2 3 4 5 6 7 8 9  10 (Date and Score)   Activity Eval   06/12/24  1. Walking 3  5   2. Bending knee  3  5  3. Fishing 3 3  4. Cigar lounge 3 5  5. Going out to dinner 3 6  Score 3 4.8   Total score = sum of the activity scores/number of activities Minimum detectable change (90%CI) for average score = 2 points Minimum detectable change (90%CI) for single activity score = 3 points  COGNITIVE STATUS: Within functional limits for tasks assessed   GAIT: 05/23/24 Distance walked: 100' within clinic Assistive device utilized: Single point cane Level of assistance: Modified independence Comments: decreased stance on LLE, decr Lt hip/knee  flexion  EDEMA: 05/23/24  Knee Joint Line: Rt: 44 cm/Lt: 47.4 cm  LOWER EXTREMITY ROM:     ROM Right eval Left eval Left 06/01/24 Left 06/08/24 Left 06/12/24  Knee flexion A: 69 A: 88 P: 95 100/103 A: 100 Seated A: 100* P: 107*  Knee extension A: -5 A: -5 (seated LAQ) P: 0      (Blank rows = not tested)   LOWER EXTREMITY MMT:    05/23/24: Not formally tested; Lt knee grossly 3-/5   MMT Right eval Left eval  Hip flexion    Hip extension    Hip abduction    Hip adduction    Hip internal rotation    Hip external rotation    Knee flexion    Knee extension    Ankle dorsiflexion    Ankle plantarflexion    Ankle inversion    Ankle eversion     (Blank rows = not tested)    TREATMENT:   L TKA                                                                                                                    DATE:  06/12/2024 Therapeutic Activities: Pt arrived amb with SPC. PT noted Left knee instability with buckling motion that he was able to catch self with RLE.  Pt amb within clinic with [facility] with PT providing close supervision  SciFit seat 11 x 8 min; L1; UE/LEss 4 min then LEs only 4 min Leg press 100# 3x10 bil; then LLE only 37# 3x10 LLE standing BUE support TKE green Tband 10 reps ea set: 1st set TKE only; 2nd set TKE + SLS; 3rd set TKE + stepping RLE from terminal stance to initial contact over 4 beam.   LLE Seated LAQ with 3# 10 reps 2 sets LLE seated hamstring curls green theraband 10 reps 2 sets LLE step back (center of mass anterior to knee) with knee extension and lifting RLE so LLE is in SLS 10 reps with BUE hand held support See objective data for ROM  Manual Therapy: PROM with overpressure with distraction and tibial internal rotation.   Vaso left knee with elevation 34* medium compression 10 minutes.    DATE:  06/08/24 TherAct SciFit seat 11 x 8 min; L1; UE/LEss initially progressing to LEs only Calf raises 2x10 bil UE support Leg press 87# 3x10  bil; then LLE only 37# 3x10 Seated reciprocal LAQ with 3# on Lt for flexion ROM and quad activation 2x10   TherEx Incline board gastroc stretch 3x30 sec bil ROM measurements - see above for details Heel slides x 20 reps on Lt with ball    06/05/2024 There Ex SciFit seat 11 level 1 BLEs & BUEs  6 min then BLEs only 2 min Gastroc stretch on step heel depression 30 sec hold 3 reps ea LE Hamstring stretch LLE long leg strap DF 30 sec 2 reps Quad stretch with LLE over edge &strap knee flexion 30 sec 2 reps PT added above stretches to HEP with HO (reviewed while on vaso).  Pt verbalized understanding  after performing in clinic.  Supine knee flexion with ball and strap 3x10  There Act for sit/stand & stairs Leg Press BLEs 81# 10 reps 2 sets;  single LE 37# 10 reps each LE PT demo & verbal cues on proper sit to/from stand technique.  Pt performed with BUE assist to 20 mat table and 18 chair with armrests.  Manual Therapy Scar mobs prox/distal and med/lat.    Vaso with elevation medium compression 34* left knee 10 min.     06/01/2024 There Ex Nustep level 3  6 min  Seated knee ext 2x10 Seated heel slides 2x10 Supine knee flexion with ball and strap 3x10 Supine quad sets 2x10 SLR 2x10 There Act for transfers Seated knee flexion with over pressure by PT 20x Seated marching  2x20 Manual: PROM, hamstring stretching 3x25 sec                                                                                                                         PATIENT EDUCATION:  Education details: HEP Person educated: Patient Education method: Programmer, multimedia, Facilities manager, and Handouts Education comprehension: verbalized understanding, returned demonstration, and needs further education  HOME EXERCISE PROGRAM: Access Code: [email or web address] URL: [email or web address] Date: 06/05/2024 Prepared by: Grayce Spatz  Exercises - Supine Heel Slide with Strap  - 5-10 x daily - 7 x weekly -  1 sets - 5-10 reps - Quad Set  - 5-10 x daily - 7 x weekly - 1 sets - 5-10 reps - 5 sec hold - Seated Knee Extension AROM  - 3-5 x daily - 7 x weekly - 1 sets - 10 reps - 5 sec hold - Seated Knee Flexion AAROM  - 3-5 x daily - 7 x weekly - 1 sets - 10 reps - 5-10 sec hold - Gastroc Stretch on Step  - 1-2 x daily - 7 x weekly - 1 sets - 3 reps - 30 seconds hold - Seated Table Hamstring Stretch  - 1-2 x daily - 7 x weekly - 1 sets - 3 reps - 30 seconds hold - Supine Quadriceps Stretch with Strap on Table  - 1-2 x daily - 7 x weekly - 1 sets - 3 reps - 30 seconds hold   ASSESSMENT: CLINICAL IMPRESSION: PT noted left knee instability in stance when amb with cane.  PT activities emphasized functional knee ext for stability.  Pt continues to benefit from skilled PT.  OBJECTIVE IMPAIRMENTS: Abnormal gait, decreased balance, decreased mobility, difficulty walking, decreased ROM, decreased strength, hypomobility, increased edema, increased muscle spasms, and pain.   ACTIVITY LIMITATIONS: sitting, standing, squatting, sleeping, stairs, transfers, bed mobility, and locomotion level  PARTICIPATION LIMITATIONS: meal prep, cleaning, laundry, driving, shopping, community activity, occupation, and yard work  PERSONAL FACTORS: Age, Past/current experiences, Time since onset of injury/illness/exacerbation, and 3+ comorbidities: anxiety, arthritis, afib, CVA, R TKR, cervical discectomy, depression are also affecting patient's functional outcome.   REHAB POTENTIAL: Good  CLINICAL DECISION MAKING: Stable/uncomplicated  EVALUATION COMPLEXITY: Low  GOALS: Goals reviewed with patient? Yes  SHORT TERM GOALS: Target date: 06/20/2024  Independent with initial HEP Goal status: MET    06/12/2024  2.  Lt knee AROM improved to 0-100 for improved mobility Goal status: MET  06/12/2024   LONG TERM GOALS: Target date: 07/18/2024   Independent with final HEP Goal status: Ongoing     06/12/2024  2.  PSFS score  improved by 3 points Goal status: Ongoing    06/12/2024  3.  Lt knee AROM improved to 0-110 for improved mobility and function Goal status:  Ongoing    06/12/2024  4.  Report pain < 3/10 with standing and walking for improved function Goal status: Ongoing    06/12/2024  5.  Amb independently without AD or significant gait abnormalities for improved function and mobility Goal status: Ongoing   06/12/2024  PLAN:  PT FREQUENCY: 2x/week  PT DURATION: 8 weeks  PLANNED INTERVENTIONS: 97164- PT Re-evaluation, 97750- Physical Performance Testing, 97110-Therapeutic exercises, 97530- Therapeutic activity, 97112- Neuromuscular re-education, 97535- Self Care, 02859- Manual therapy, [phone]- Gait training, [phone]- Aquatic Therapy, [phone]- Electrical stimulation (unattended), 97016- Vasopneumatic device, 20560 (1-2 muscles), 20561 (3+ muscles)- Dry Needling, Patient/Family education, Balance training, Stair training, Taping, Joint mobilization, Scar mobilization, DME instructions, Cryotherapy, and Moist heat.  PLAN FOR NEXT SESSION:     work on strengthening and balance as able; flexion focus for ROM,  progress functional activities including standing emphasizing knee stability   NEXT MD VISIT: 06/26/24   Grayce Spatz, PT, DPT 06/12/2024, 4:29 PM   Referring diagnosis? M17.12 Treatment diagnosis? (if different than referring diagnosis) M25.562, M25.662, M62.81, R60.0, R26.81, R26.89 What was this (referring dx) caused by? [x]  Surgery []  Fall []  Ongoing issue [x]  Arthritis []  Other: ____________  Laterality: []  Rt [x]  Lt []  Both  Check all possible CPT codes:  *CHOOSE 10 OR LESS*    See Planned Interventions listed in the Plan section of the Evaluation.

## 2024-06-13 NOTE — Telephone Encounter (Unsigned)
 Copied from CRM [id number]. Topic: Clinical - Medication Question >> Jun 13, 2024 12:23 PM Pinkey ORN wrote: Reason for CRM: Medication Request >> Jun 13, 2024 12:24 PM Pinkey ORN wrote: Patient is requesting sleep medication, states he hasn't been able to sleep. Please follow up with patient

## 2024-06-14 ENCOUNTER — Telehealth (INDEPENDENT_AMBULATORY_CARE_PROVIDER_SITE_OTHER): Admitting: Adult Health

## 2024-06-14 ENCOUNTER — Encounter: Admitting: Rehabilitative and Restorative Service Providers"

## 2024-06-14 ENCOUNTER — Encounter: Payer: Self-pay | Admitting: Adult Health

## 2024-06-14 VITALS — Wt 225.0 lb

## 2024-06-14 DIAGNOSIS — F5101 Primary insomnia: Secondary | ICD-10-CM

## 2024-06-14 MED ORDER — BELSOMRA 15 MG PO TABS: 15.0000 mg | ORAL_TABLET | Freq: Every evening | ORAL | 0 refills | Status: DC | PRN

## 2024-06-14 NOTE — Progress Notes (Signed)
 Virtual Visit via Video Note  I connected with Cristian Gonzales on 06/14/24 at  9:30 AM EDT by a video enabled telemedicine application and verified that I am speaking with the correct person using two identifiers.  Location patient: home Location provider:work or home office Persons participating in the virtual visit: patient, provider  I discussed the limitations of evaluation and management by telemedicine and the availability of in person appointments. The patient expressed understanding and agreed to proceed.   HPI: He presents to the office today for concern of longstanding insomnia.  He has had insomnia for multiple years he has trouble falling asleep and staying asleep but feels as it has become worse after his recent knee surgery.  In the past he has tried Ambien  and trazodone  but did not see much improvement.  Most recently he has been melatonin and that did not do anything. Reports that this week he has had 4 hours of sleep.    ROS: See pertinent positives and negatives per HPI.  Past Medical History:  Diagnosis Date   Anxiety    Arthritis    Cervical spine fracture (HCC)    13 -diving accident, no neurolgic sequelae   Depression    Paroxysmal atrial fibrillation (HCC)    Pneumonia    x several   Stroke (HCC)    Syncope 11/27/2012   From Tramadol     Past Surgical History:  Procedure Laterality Date   BUBBLE STUDY  03/19/2021   Procedure: BUBBLE STUDY;  Surgeon: Delford Maude BROCKS, MD;  Location: [facility] ENDOSCOPY;  Service: Cardiovascular;;   CERVICAL DISCECTOMY  09/06/2002   dr gaither   EYE SURGERY Bilateral    cataracts removed ([facility])   KNEE SURGERY  09/07/1979   right   LOOP RECORDER INSERTION N/A 03/19/2021   Procedure: LOOP RECORDER INSERTION;  Surgeon: Cindie Ole DASEN, MD;  Location: [facility] INVASIVE CV LAB;  Service: Cardiovascular;  Laterality: N/A;   LUMBAR LAMINECTOMY/DECOMPRESSION MICRODISCECTOMY N/A 07/27/2023   Procedure: Thoracic Eleven-Twelve  Decompression;  Surgeon: Onetha Kuba, MD;  Location: [facility] OR;  Service: Neurosurgery;  Laterality: N/A;   NECK SURGERY     REPLACEMENT TOTAL KNEE     right   REPLACEMENT TOTAL KNEE     TEE WITHOUT CARDIOVERSION N/A 03/19/2021   Procedure: TRANSESOPHAGEAL ECHOCARDIOGRAM (TEE);  Surgeon: Delford Maude BROCKS, MD;  Location: [facility] ENDOSCOPY;  Service: Cardiovascular;  Laterality: N/A;   TOTAL KNEE ARTHROPLASTY Left 05/14/2024   Procedure: ARTHROPLASTY, KNEE, TOTAL;  Surgeon: Jerri Kay HERO, MD;  Location: [facility] OR;  Service: Orthopedics;  Laterality: Left;    Family History  Problem Relation Age of Onset   Heart attack Father 61       Sudden Cardiac Death   Liver cancer Mother 68       Current Outpatient Medications:    acetaminophen -codeine  (TYLENOL  #3) 300-30 MG tablet, Take 1 tablet by mouth 2 (two) times daily as needed., Disp: 30 tablet, Rfl: 1   apixaban  (ELIQUIS ) 5 MG TABS tablet, Take 5 mg by mouth 2 (two) times daily., Disp: , Rfl:    buPROPion  (WELLBUTRIN  XL) 150 MG 24 hr tablet, TAKE 1 TABLET BY MOUTH EVERY DAY, Disp: 90 tablet, Rfl: 1   carvedilol  (COREG ) 6.25 MG tablet, Take 1 tablet (6.25 mg total) by mouth 2 (two) times daily with a meal., Disp: 60 tablet, Rfl: 5   docusate sodium  (COLACE) 100 MG capsule, Take 1 capsule (100 mg total) by mouth daily as needed., Disp: 30  capsule, Rfl: 2   Evolocumab  (REPATHA  SURECLICK) 140 MG/ML SOAJ, INJECT 140 MG INTO THE SKIN EVERY 14 (FOURTEEN) DAYS., Disp: 6 mL, Rfl: 3   HYDROcodone -acetaminophen  (NORCO/VICODIN) 5-325 MG tablet, Take 1-2 tablets by mouth 3 (three) times daily as needed., Disp: 24 tablet, Rfl: 0   methocarbamol  (ROBAXIN ) 500 MG tablet, Take 1 tablet (500 mg total) by mouth 2 (two) times daily as needed., Disp: 20 tablet, Rfl: 2   ondansetron  (ZOFRAN ) 4 MG tablet, Take 1 tablet (4 mg total) by mouth every 8 (eight) hours as needed for nausea or vomiting., Disp: 40 tablet, Rfl: 0   oxyCODONE -acetaminophen  (PERCOCET) 5-325 MG tablet, Take  1-2 tablets by mouth every 6 (six) hours as needed. To be taken after surgery, Disp: 40 tablet, Rfl: 0  EXAM:  VITALS per patient if applicable:  GENERAL: alert, oriented, appears well and in no acute distress  HEENT: atraumatic, conjunttiva clear, no obvious abnormalities on inspection of external nose and ears  NECK: normal movements of the head and neck  LUNGS: on inspection no signs of respiratory distress, breathing rate appears normal, no obvious gross SOB, gasping or wheezing  CV: no obvious cyanosis  MS: moves all visible extremities without noticeable abnormality  PSYCH/NEURO: pleasant and cooperative, no obvious depression or anxiety, speech and thought processing grossly intact  ASSESSMENT AND PLAN:  Discussed the following assessment and plan:  1. Primary insomnia (Primary) - Will trial him on Belsomra since this covered by his insurance.  - He will let me know how he responds to this. Can increase to 20 mg at bedtime if needed - Suvorexant (BELSOMRA) 15 MG TABS; Take 1 tablet (15 mg total) by mouth at bedtime as needed.  Dispense: 30 tablet; Refill: 0      I discussed the assessment and treatment plan with the patient. The patient was provided an opportunity to ask questions and all were answered. The patient agreed with the plan and demonstrated an understanding of the instructions.   The patient was advised to call back or seek an in-person evaluation if the symptoms worsen or if the condition fails to improve as anticipated.   [name], NP   I personally spent a total of 31 minutes in the care of the patient today including preparing to see the patient, getting/reviewing separately obtained history, performing a medically appropriate exam/evaluation, and documenting clinical information in the EHR.

## 2024-06-18 ENCOUNTER — Ambulatory Visit: Admitting: Physical Therapy

## 2024-06-18 ENCOUNTER — Encounter: Payer: Self-pay | Admitting: Physical Therapy

## 2024-06-18 DIAGNOSIS — R2689 Other abnormalities of gait and mobility: Secondary | ICD-10-CM

## 2024-06-18 DIAGNOSIS — R2681 Unsteadiness on feet: Secondary | ICD-10-CM

## 2024-06-18 DIAGNOSIS — R600 Localized edema: Secondary | ICD-10-CM

## 2024-06-18 DIAGNOSIS — M6281 Muscle weakness (generalized): Secondary | ICD-10-CM

## 2024-06-18 DIAGNOSIS — M25562 Pain in left knee: Secondary | ICD-10-CM | POA: Diagnosis not present

## 2024-06-18 DIAGNOSIS — M25662 Stiffness of left knee, not elsewhere classified: Secondary | ICD-10-CM | POA: Diagnosis not present
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
## 2024-06-20 ENCOUNTER — Other Ambulatory Visit: Payer: Self-pay | Admitting: Physician Assistant

## 2024-06-20 ENCOUNTER — Telehealth: Payer: Self-pay | Admitting: *Deleted

## 2024-06-20 MED ORDER — HYDROCODONE-ACETAMINOPHEN 5-325 MG PO TABS: 1.0000 | ORAL_TABLET | Freq: Two times a day (BID) | ORAL | 0 refills | Status: DC | PRN

## 2024-06-20 MED ORDER — ACETAMINOPHEN-CODEINE 300-30 MG PO TABS: 1.0000 | ORAL_TABLET | Freq: Two times a day (BID) | ORAL | 0 refills | Status: DC | PRN

## 2024-06-20 NOTE — Telephone Encounter (Signed)
 Sent in norco

## 2024-06-20 NOTE — Telephone Encounter (Signed)
 Spoke with patient for his 30 day call and he does need a refill of pain medication. Working with therapy and going well. Thanks.

## 2024-06-21 ENCOUNTER — Ambulatory Visit: Admitting: Rehabilitative and Restorative Service Providers"

## 2024-06-21 ENCOUNTER — Encounter: Payer: Self-pay | Admitting: Rehabilitative and Restorative Service Providers"

## 2024-06-21 DIAGNOSIS — R600 Localized edema: Secondary | ICD-10-CM

## 2024-06-21 DIAGNOSIS — M5114 Intervertebral disc disorders with radiculopathy, thoracic region: Secondary | ICD-10-CM | POA: Diagnosis not present

## 2024-06-21 DIAGNOSIS — M25662 Stiffness of left knee, not elsewhere classified: Secondary | ICD-10-CM

## 2024-06-21 DIAGNOSIS — M5440 Lumbago with sciatica, unspecified side: Secondary | ICD-10-CM | POA: Diagnosis not present

## 2024-06-21 DIAGNOSIS — M48061 Spinal stenosis, lumbar region without neurogenic claudication: Secondary | ICD-10-CM | POA: Diagnosis not present

## 2024-06-21 DIAGNOSIS — M6281 Muscle weakness (generalized): Secondary | ICD-10-CM

## 2024-06-21 DIAGNOSIS — M4714 Other spondylosis with myelopathy, thoracic region: Secondary | ICD-10-CM | POA: Diagnosis not present

## 2024-06-21 DIAGNOSIS — R2681 Unsteadiness on feet: Secondary | ICD-10-CM

## 2024-06-21 DIAGNOSIS — M5117 Intervertebral disc disorders with radiculopathy, lumbosacral region: Secondary | ICD-10-CM | POA: Diagnosis not present

## 2024-06-21 DIAGNOSIS — R2689 Other abnormalities of gait and mobility: Secondary | ICD-10-CM

## 2024-06-21 DIAGNOSIS — M25562 Pain in left knee: Secondary | ICD-10-CM | POA: Diagnosis not present

## 2024-06-21 DIAGNOSIS — M4726 Other spondylosis with radiculopathy, lumbar region: Secondary | ICD-10-CM | POA: Diagnosis not present

## 2024-06-21 NOTE — Therapy (Signed)
 OUTPATIENT PHYSICAL THERAPY TREATMENT   Patient Name: Cristian Gonzales MRN: [id number] DOB:01/27/1956, 68 y.o., male Today's Date: 06/21/2024  END OF SESSION:  PT End of Session - 06/21/24 1511     Visit Number 7    Number of Visits 16    Date for Recertification  07/18/24    Authorization Type [employer] $25 copay    Progress Note Due on Visit 10    PT Start Time 1511    PT Stop Time 1600    PT Time Calculation (min) 49 min    Activity Tolerance Patient tolerated treatment well;No increased pain;Patient limited by pain    Behavior During Therapy [facility] for tasks assessed/performed                Past Medical History:  Diagnosis Date   Anxiety    Arthritis    Cervical spine fracture (HCC)    13 -diving accident, no neurolgic sequelae   Depression    Paroxysmal atrial fibrillation (HCC)    Pneumonia    x several   Stroke (HCC)    Syncope 11/27/2012   From Tramadol    Past Surgical History:  Procedure Laterality Date   BUBBLE STUDY  03/19/2021   Procedure: BUBBLE STUDY;  Surgeon: Delford Maude BROCKS, MD;  Location: [facility] ENDOSCOPY;  Service: Cardiovascular;;   CERVICAL DISCECTOMY  09/06/2002   dr gaither   EYE SURGERY Bilateral    cataracts removed ([facility])   KNEE SURGERY  09/07/1979   right   LOOP RECORDER INSERTION N/A 03/19/2021   Procedure: LOOP RECORDER INSERTION;  Surgeon: Cindie Ole DASEN, MD;  Location: [facility] INVASIVE CV LAB;  Service: Cardiovascular;  Laterality: N/A;   LUMBAR LAMINECTOMY/DECOMPRESSION MICRODISCECTOMY N/A 07/27/2023   Procedure: Thoracic Eleven-Twelve Decompression;  Surgeon: Onetha Kuba, MD;  Location: [facility];  Service: Neurosurgery;  Laterality: N/A;   NECK SURGERY     REPLACEMENT TOTAL KNEE     right   REPLACEMENT TOTAL KNEE     TEE WITHOUT CARDIOVERSION N/A 03/19/2021   Procedure: TRANSESOPHAGEAL ECHOCARDIOGRAM (TEE);  Surgeon: Delford Maude BROCKS, MD;  Location: [facility] ENDOSCOPY;  Service: Cardiovascular;  Laterality: N/A;   TOTAL KNEE  ARTHROPLASTY Left 05/14/2024   Procedure: ARTHROPLASTY, KNEE, TOTAL;  Surgeon: Jerri Kay CHRISTELLA, MD;  Location: [facility] OR;  Service: Orthopedics;  Laterality: Left;   Patient Active Problem List   Diagnosis Date Noted   Status post total left knee replacement 05/14/2024   Primary osteoarthritis of left knee 02/07/2024   Body mass index 40.0-44.9, adult (HCC) 02/07/2024   Myelopathy (HCC) 07/27/2023   Spinal stenosis of lumbar region 07/25/2023   Cord compression myelopathy (HCC) 07/25/2023   Paroxysmal atrial fibrillation (HCC) 07/25/2023   Essential hypertension 07/25/2023   Obesity (BMI 30-39.9) 07/25/2023   Atrial fibrillation with RVR (HCC) 02/07/2023   Elevated troponin 02/07/2023   Hypokalemia 02/07/2023   SIRS (systemic inflammatory response syndrome) (HCC) 02/07/2023   Pulmonary nodule 02/07/2023   Chronic diastolic CHF (congestive heart failure) (HCC) 02/07/2023   Chest pain 02/06/2023   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   Hyperlipidemia 07/30/2015   Lumbar disc disease with radiculopathy 07/29/2014   Left-sided thoracic back pain 06/05/2014   Numbness and tingling sensation of skin 06/25/2013   Syncope 11/27/2012   Depression 11/27/2012   Epididymitis, left 11/23/2010   Allergic rhinitis 11/07/2009   Extrinsic asthma 02/05/2008   Anxiety state 08/08/2007    PCP: Merna Huxley, NP  REFERRING PROVIDER: Jerri Kay CHRISTELLA, MD  REFERRING  DIAG: M17.12 (ICD-10-CM) - Primary osteoarthritis of left knee   Rationale for Evaluation and Treatment: Rehabilitation  THERAPY DIAG:  Acute pain of left knee  Stiffness of left knee, not elsewhere classified  Muscle weakness (generalized)  Localized edema  Unsteadiness on feet  Other abnormalities of gait and mobility  ONSET DATE: 05/14/24 left TKA   SUBJECTIVE:                                                                                                                                                                                            SUBJECTIVE STATEMENT: Cristian Gonzales is getting about 3-4 hours of sleep uninterrupted with his new medication.  He is having back surgery in the next few weeks.  PERTINENT HISTORY:  Left TKA 05/14/24, anxiety, arthritis, afib, CVA, R TKR, cervical discectomy, depression  PAIN:  Are you having pain? Yes: NPRS scale: 0-6/10 this week Pain location: Lt knee and hip Pain description: sharp, aching, sore, deep Aggravating factors: standing, walking Relieving factors: sitting, resting  PRECAUTIONS:  None  RED FLAGS: None   WEIGHT BEARING RESTRICTIONS:  No  FALLS:  Has patient fallen in last 6 months? No  LIVING ENVIRONMENT: Lives with: lives with their spouse Lives in: House/apartment Stairs: Yes: External: 1 steps; none Has following equipment at home: Single point cane and Walker - 2 wheeled  OCCUPATION:  Retired - Set designer  PLOF:  Independent and Leisure: smoke cigars, go fishing, belongs to [employer] - goes 4 days a week; walks dog 1/2 mile every morning  PATIENT GOALS:  Improve pain, flexion in knee   OBJECTIVE:  Note: Objective measures were completed at Evaluation unless otherwise noted.   PATIENT SURVEYS:  Patient-Specific Activity Scoring Scheme  0 represents "unable to perform." 10 represents "able to perform at prior level. 0 1 2 3 4 5 6 7 8 9  10 (Date and Score)   Activity Eval   06/12/24  1. Walking 3  5   2. Bending knee  3  5  3. Fishing 3 3  4. Cigar lounge 3 5  5. Going out to dinner 3 6  Score 3 4.8   Total score = sum of the activity scores/number of activities Minimum detectable change (90%CI) for average score = 2 points Minimum detectable change (90%CI) for single activity score = 3 points  COGNITIVE STATUS: Within functional limits for tasks assessed   GAIT: 05/23/24 Distance walked: 100' within clinic Assistive device utilized: Single point cane Level of assistance: Modified independence Comments: decreased stance  on LLE, decr Lt hip/knee flexion  EDEMA: 05/23/24  Knee Joint Line: Rt: 44 cm/Lt: 47.4 cm  LOWER EXTREMITY ROM:     ROM Right eval Left eval Left 06/01/24 Left 06/08/24 Left 06/12/24 Left 06/18/24 Left/Right 06/21/2024  Knee flexion A: 69 A: 88 P: 95 100/103 A: 100 Seated A: 100* P: 107* A: 104 104/57  Knee extension A: -5 A: -5 (seated LAQ) P: 0     2/5   (Blank rows = not tested)   LOWER EXTREMITY MMT:    05/23/24: Not formally tested; Lt knee grossly 3-/5   MMT Right eval Left eval  Hip flexion    Hip extension    Hip abduction    Hip adduction    Hip internal rotation    Hip external rotation    Knee flexion    Knee extension    Ankle dorsiflexion    Ankle plantarflexion    Ankle inversion    Ankle eversion     (Blank rows = not tested)    TREATMENT:   L TKA                                                                                                                    DATE:  06/21/2024 Recumbent bike Seat 5 for active assisted range only 5 minutes Quad sets 2 sets of 10 for 5 seconds Seated knee flexion and TKE 20 x 5 seconds in each direction  Functional Activities: Single Leg Press: 75# 2 sets of 15 with slow eccentrics  [id number]: Comprehensive review of current HEP with cues to avoid back flare-ups  Vaso Left Knee High 34* 10 minutes   06/18/24 TherAct Recumbent bike seat 5, x 8 min; partial revolutions Leg press bil 100# 3x10; then LLE 75# 3x10 Forward step ups onto 6 step 2x10; bil UE support  Neuro Re-Ed Sidestepping on foam x 5 laps; UE support needed Tandem walking forward/backward x 5 laps; UE support needed  Modalities Vaso x 10 min; mod pressure in elevation to Lt knee; 34 deg   06/12/2024 Therapeutic Activities: Pt arrived amb with [facility]PC. PT noted Left knee instability with buckling motion that he was able to catch self with RLE.  Pt amb within clinic with [facility] with PT providing close supervision  SciFit seat 11 x 8 min; L1;  UE/LEss 4 min then LEs only 4 min Leg press 100# 3x10 bil; then LLE only 37# 3x10 LLE standing BUE support TKE green Tband 10 reps ea set: 1st set TKE only; 2nd set TKE + SLS; 3rd set TKE + stepping RLE from terminal stance to initial contact over 4 beam.   LLE Seated LAQ with 3# 10 reps 2 sets LLE seated hamstring curls green theraband 10 reps 2 sets LLE step back (center of mass anterior to knee) with knee extension and lifting RLE so LLE is in SLS 10 reps with BUE hand held support See objective data for ROM  Manual Therapy: PROM with overpressure with distraction and tibial internal rotation.   Vaso left knee with elevation 34* medium compression 10 minutes.  PATIENT EDUCATION:  Education details: HEP Person educated: Patient Education method: Programmer, multimedia, Facilities manager, and Handouts Education comprehension: verbalized understanding, returned demonstration, and needs further education  HOME EXERCISE PROGRAM: Access Code: [email or web address] URL: [email or web address] Date: 06/05/2024 Prepared by: Grayce Spatz  Exercises - Supine Heel Slide with Strap  - 5-10 x daily - 7 x weekly - 1 sets - 5-10 reps - Quad Set  - 5-10 x daily - 7 x weekly - 1 sets - 5-10 reps - 5 sec hold - Seated Knee Extension AROM  - 3-5 x daily - 7 x weekly - 1 sets - 10 reps - 5 sec hold - Seated Knee Flexion AAROM  - 3-5 x daily - 7 x weekly - 1 sets - 10 reps - 5-10 sec hold - Gastroc Stretch on Step  - 1-2 x daily - 7 x weekly - 1 sets - 3 reps - 30 seconds hold - Seated Table Hamstring Stretch  - 1-2 x daily - 7 x weekly - 1 sets - 3 reps - 30 seconds hold - Supine Quadriceps Stretch with Strap on Table  - 1-2 x daily - 7 x weekly - 1 sets - 3 reps - 30 seconds hold   ASSESSMENT: CLINICAL IMPRESSION: AROM was assessed at 0 - 2 - 104 degrees today.  Tyreak appears to be doing a good job with his home exercises and he is on track to meet long-term goals.  It  appears that his right knee and back have been giving him more functional difficulty and this is affecting his ability to completely participate with all rehabilitation activities.  We spent some time today reviewing modifications to home exercises as needed to reduce back and right knee discomfort.  OBJECTIVE IMPAIRMENTS: Abnormal gait, decreased balance, decreased mobility, difficulty walking, decreased ROM, decreased strength, hypomobility, increased edema, increased muscle spasms, and pain.   ACTIVITY LIMITATIONS: sitting, standing, squatting, sleeping, stairs, transfers, bed mobility, and locomotion level  PARTICIPATION LIMITATIONS: meal prep, cleaning, laundry, driving, shopping, community activity, occupation, and yard work  PERSONAL FACTORS: Age, Past/current experiences, Time since onset of injury/illness/exacerbation, and 3+ comorbidities: anxiety, arthritis, afib, CVA, R TKR, cervical discectomy, depression are also affecting patient's functional outcome.   REHAB POTENTIAL: Good  CLINICAL DECISION MAKING: Stable/uncomplicated  EVALUATION COMPLEXITY: Low   GOALS: Goals reviewed with patient? Yes  SHORT TERM GOALS: Target date: 06/20/2024  Independent with initial HEP Goal status: MET    06/12/2024  2.  Lt knee AROM improved to 0-100 for improved mobility Goal status: MET  06/12/2024   LONG TERM GOALS: Target date: 07/18/2024   Independent with final HEP Goal status: Ongoing     06/21/2024  2.  PSFS score improved by 3 points Goal status: Ongoing    06/12/2024  3.  Lt knee AROM improved to 0-110 for improved mobility and function Goal status:  Ongoing    06/21/2024  4.  Report pain < 3/10 with standing and walking for improved function Goal status: Ongoing    06/21/2024  5.  Amb independently without AD or significant gait abnormalities for improved function and mobility Goal status: Ongoing   06/21/2024  PLAN:  PT FREQUENCY: 2x/week  PT DURATION: 8  weeks  PLANNED INTERVENTIONS: 97164- PT Re-evaluation, 97750- Physical Performance Testing, 97110-Therapeutic exercises, 97530- Therapeutic activity, [id number]- Neuromuscular re-education, 97535- Self Care, [id number]- Manual therapy, [id number]- Gait training, [phone]- Aquatic Therapy, [phone]- Electrical stimulation (unattended), 97016- Vasopneumatic device, 20560 (1-2 muscles), 20561 (3+ muscles)- Dry Needling, Patient/Family education, Balance training,  Stair training, Taping, Joint mobilization, Scar mobilization, DME instructions, Cryotherapy, and Moist heat.  PLAN FOR NEXT SESSION: Continue emphasis on quadriceps strength, knee AROM and edema control while avoiding flaring-up the back and right knee.   NEXT MD VISIT: 06/26/24   Myer LELON Ivory PT, MPT 06/21/24 3:58 PM    Referring diagnosis? M17.12 Treatment diagnosis? (if different than referring diagnosis) M25.562, M25.662, M62.81, R60.0, R26.81, R26.89 What was this (referring dx) caused by? [x]  Surgery []  Fall []  Ongoing issue [x]  Arthritis []  Other: ____________  Laterality: []  Rt [x]  Lt []  Both  Check all possible CPT codes:  *CHOOSE 10 OR LESS*    See Planned Interventions listed in the Plan section of the Evaluation.

## 2024-06-25 ENCOUNTER — Encounter: Payer: Self-pay | Admitting: Physical Therapy

## 2024-06-25 ENCOUNTER — Ambulatory Visit: Admitting: Physical Therapy

## 2024-06-25 DIAGNOSIS — R2689 Other abnormalities of gait and mobility: Secondary | ICD-10-CM | POA: Diagnosis not present

## 2024-06-25 DIAGNOSIS — R2681 Unsteadiness on feet: Secondary | ICD-10-CM | POA: Diagnosis not present

## 2024-06-25 DIAGNOSIS — M6281 Muscle weakness (generalized): Secondary | ICD-10-CM | POA: Diagnosis not present

## 2024-06-25 DIAGNOSIS — M25662 Stiffness of left knee, not elsewhere classified: Secondary | ICD-10-CM

## 2024-06-25 DIAGNOSIS — M25562 Pain in left knee: Secondary | ICD-10-CM | POA: Diagnosis not present

## 2024-06-25 DIAGNOSIS — R600 Localized edema: Secondary | ICD-10-CM | POA: Diagnosis not present
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
## 2024-06-26 ENCOUNTER — Encounter: Payer: Self-pay | Admitting: Physician Assistant

## 2024-06-26 ENCOUNTER — Other Ambulatory Visit: Payer: Self-pay

## 2024-06-26 ENCOUNTER — Ambulatory Visit (INDEPENDENT_AMBULATORY_CARE_PROVIDER_SITE_OTHER): Admitting: Physician Assistant

## 2024-06-26 DIAGNOSIS — Z96652 Presence of left artificial knee joint: Secondary | ICD-10-CM

## 2024-06-26 MED ORDER — HYDROCODONE-ACETAMINOPHEN 5-325 MG PO TABS: 1.0000 | ORAL_TABLET | Freq: Two times a day (BID) | ORAL | 0 refills | Status: DC | PRN

## 2024-06-26 MED ORDER — DOCUSATE SODIUM 100 MG PO CAPS: 100.0000 mg | ORAL_CAPSULE | Freq: Two times a day (BID) | ORAL | 0 refills | Status: DC
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
## 2024-06-27 ENCOUNTER — Other Ambulatory Visit: Payer: Self-pay | Admitting: Adult Health

## 2024-06-27 DIAGNOSIS — G8929 Other chronic pain: Secondary | ICD-10-CM

## 2024-06-28 ENCOUNTER — Encounter: Payer: Self-pay | Admitting: Physical Therapy

## 2024-06-28 ENCOUNTER — Ambulatory Visit: Admitting: Physical Therapy

## 2024-06-28 DIAGNOSIS — M5440 Lumbago with sciatica, unspecified side: Secondary | ICD-10-CM | POA: Diagnosis not present

## 2024-06-28 DIAGNOSIS — M6281 Muscle weakness (generalized): Secondary | ICD-10-CM

## 2024-06-28 DIAGNOSIS — M25562 Pain in left knee: Secondary | ICD-10-CM

## 2024-06-28 DIAGNOSIS — R2681 Unsteadiness on feet: Secondary | ICD-10-CM | POA: Diagnosis not present

## 2024-06-28 DIAGNOSIS — R600 Localized edema: Secondary | ICD-10-CM | POA: Diagnosis not present

## 2024-06-28 DIAGNOSIS — M48062 Spinal stenosis, lumbar region with neurogenic claudication: Secondary | ICD-10-CM | POA: Diagnosis not present

## 2024-06-28 DIAGNOSIS — M25662 Stiffness of left knee, not elsewhere classified: Secondary | ICD-10-CM

## 2024-06-28 DIAGNOSIS — Z6838 Body mass index (BMI) 38.0-38.9, adult: Secondary | ICD-10-CM | POA: Diagnosis not present

## 2024-06-28 NOTE — Telephone Encounter (Signed)
  The original prescription was discontinued on 02/27/2024 by Dorina Base, CMA for the following reason: Patient Preference. Renewing this prescription may not be appropriate.

## 2024-06-28 NOTE — Therapy (Unsigned)
 OUTPATIENT PHYSICAL THERAPY TREATMENT   Patient Name: Cristian Gonzales MRN: [id number] DOB:[birth year], 68 y.o., male Today's Date: 06/28/2024      END OF SESSION:  PT End of Session - 06/28/24 1258     Visit Number 9    Number of Visits 16    Date for Recertification  07/18/24    Authorization Type [employer] $25 copay    Authorization Time Period [employer] $25 COPAY  Approved  Authorization #[id number]  Tracking #[id number]  05/23/2024-07/18/2024  10 PT visits    Authorization - Number of Visits 10    Progress Note Due on Visit 18    PT Start Time 1258    PT Stop Time 1348    PT Time Calculation (min) 50 min    Activity Tolerance Patient tolerated treatment well;No increased pain;Patient limited by pain    Behavior During Therapy [facility] for tasks assessed/performed                  Past Medical History:  Diagnosis Date   Anxiety    Arthritis    Cervical spine fracture (HCC)    13 -diving accident, no neurolgic sequelae   Depression    Paroxysmal atrial fibrillation (HCC)    Pneumonia    x several   Stroke (HCC)    Syncope 11/27/2012   From Tramadol    Past Surgical History:  Procedure Laterality Date   BUBBLE STUDY  03/19/2021   Procedure: BUBBLE STUDY;  Surgeon: Delford Maude BROCKS, MD;  Location: [facility] ENDOSCOPY;  Service: Cardiovascular;;   CERVICAL DISCECTOMY  09/06/2002   dr gaither   EYE SURGERY Bilateral    cataracts removed ([facility])   KNEE SURGERY  09/07/1979   right   LOOP RECORDER INSERTION N/A 03/19/2021   Procedure: LOOP RECORDER INSERTION;  Surgeon: Cindie Ole DASEN, MD;  Location: [facility] INVASIVE CV LAB;  Service: Cardiovascular;  Laterality: N/A;   LUMBAR LAMINECTOMY/DECOMPRESSION MICRODISCECTOMY N/A 07/27/2023   Procedure: Thoracic Eleven-Twelve Decompression;  Surgeon: Onetha Kuba, MD;  Location: [facility] OR;  Service: Neurosurgery;  Laterality: N/A;   NECK SURGERY     REPLACEMENT TOTAL KNEE     right   REPLACEMENT TOTAL KNEE     TEE WITHOUT  CARDIOVERSION N/A 03/19/2021   Procedure: TRANSESOPHAGEAL ECHOCARDIOGRAM (TEE);  Surgeon: Delford Maude BROCKS, MD;  Location: [facility] ENDOSCOPY;  Service: Cardiovascular;  Laterality: N/A;   TOTAL KNEE ARTHROPLASTY Left 05/14/2024   Procedure: ARTHROPLASTY, KNEE, TOTAL;  Surgeon: Jerri Kay CHRISTELLA, MD;  Location: [facility] OR;  Service: Orthopedics;  Laterality: Left;   Patient Active Problem List   Diagnosis Date Noted   Status post total left knee replacement 05/14/2024   Primary osteoarthritis of left knee 02/07/2024   Body mass index 40.0-44.9, adult (HCC) 02/07/2024   Myelopathy (HCC) 07/27/2023   Spinal stenosis of lumbar region 07/25/2023   Cord compression myelopathy (HCC) 07/25/2023   Paroxysmal atrial fibrillation (HCC) 07/25/2023   Essential hypertension 07/25/2023   Obesity (BMI 30-39.9) 07/25/2023   Atrial fibrillation with RVR (HCC) 02/07/2023   Elevated troponin 02/07/2023   Hypokalemia 02/07/2023   SIRS (systemic inflammatory response syndrome) (HCC) 02/07/2023   Pulmonary nodule 02/07/2023   Chronic diastolic CHF (congestive heart failure) (HCC) 02/07/2023   Chest pain 02/06/2023   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   Hyperlipidemia 07/30/2015   Lumbar disc disease with radiculopathy 07/29/2014   Left-sided thoracic back pain 06/05/2014   Numbness and tingling sensation of skin 06/25/2013   Syncope 11/27/2012  Depression 11/27/2012   Epididymitis, left 11/23/2010   Allergic rhinitis 11/07/2009   Extrinsic asthma 02/05/2008   Anxiety state 08/08/2007    PCP: Merna Huxley, NP  REFERRING PROVIDER: Jerri Kay HERO, MD  REFERRING DIAG: [phone] (ICD-10-CM) - Primary osteoarthritis of left knee   Rationale for Evaluation and Treatment: Rehabilitation  THERAPY DIAG:  Acute pain of left knee  Stiffness of left knee, not elsewhere classified  Muscle weakness (generalized)  Localized edema  Unsteadiness on feet  ONSET DATE: 05/14/24 left TKA   SUBJECTIVE:                                                                                                                                                                                            SUBJECTIVE STATEMENT: Doing well; found out yesterday he has to have back surgery (awaiting insurance approval) .    PERTINENT HISTORY:  Left TKA 05/14/24, anxiety, arthritis, afib, CVA, R TKR, cervical discectomy, depression  PAIN:  Are you having pain? Yes: NPRS scale: in last week  0-5/10 during day and laying down to sleep up to 7-8/10 Pain location: Lt knee and hip Pain description: sharp, aching, sore, deep Aggravating factors: standing, walking Relieving factors: sitting, resting  PRECAUTIONS:  None  RED FLAGS: None   WEIGHT BEARING RESTRICTIONS:  No  FALLS:  Has patient fallen in last 6 months? No  LIVING ENVIRONMENT: Lives with: lives with their spouse Lives in: House/apartment Stairs: Yes: External: 1 steps; none Has following equipment at home: Single point cane and Walker - 2 wheeled  OCCUPATION:  Retired - Set designer  PLOF:  Independent and Leisure: smoke cigars, go fishing, belongs to [employer] - goes 4 days a week; walks dog 1/2 mile every morning  PATIENT GOALS:  Improve pain, flexion in knee   OBJECTIVE:  Note: Objective measures were completed at Evaluation unless otherwise noted.   PATIENT SURVEYS:  Patient-Specific Activity Scoring Scheme  0 represents "unable to perform." 10 represents "able to perform at prior level. 0 1 2 3 4 5 6 7 8 9  10 (Date and Score)   Activity Eval   06/12/24  1. Walking 3  5   2. Bending knee  3  5  3. Fishing 3 3  4. Cigar lounge 3 5  5. Going out to dinner 3 6  Score 3 4.8   Total score = sum of the activity scores/number of activities Minimum detectable change (90%CI) for average score = 2 points Minimum detectable change (90%CI) for single activity score = 3 points  COGNITIVE STATUS: Within functional limits for tasks  assessed   GAIT: 05/23/24 Distance  walked: 100' within clinic Assistive device utilized: Single point cane Level of assistance: Modified independence Comments: decreased stance on LLE, decr Lt hip/knee flexion  EDEMA: 05/23/24  Knee Joint Line: Rt: 44 cm/Lt: 47.4 cm   LOWER EXTREMITY ROM:     ROM Right eval Left eval Left 06/01/24 Left 06/08/24 Left 06/12/24 Left 06/18/24 Left/Right 06/21/2024 Left 06/25/24 Left 06/28/24  Knee flexion A: 69 A: 88 P: 95 100/103 A: 100 Seated A: 100* P: 107* A: 104 104/57 Supine A: 107* P: 110* A: 104  Knee extension A: -5 A: -5 (seated LAQ) P: 0     2/5 LAQ -2* Standing A:0*    (Blank rows = not tested)   LOWER EXTREMITY MMT:    05/23/24: Not formally tested; Lt knee grossly 3-/5   MMT Body weight (BW) 225# Right 06/25/24 Left 06/25/24  Knee flexion    Knee extension HH dynameter 59.9#, 63.7# RLE 27.4% of BW  HH dynameter 42#, 46# LLE:RLE 72% LLE 19.6% of BW   (Blank rows = not tested)    TREATMENT:   L TKA                                                                                                                    DATE:  06/28/24 TherAct SciFit UE/LEs L1 partial revolutions x 8 min  Leg press 100# 3x10 (RLE higher on foot plate) Forward step ups 2x10 bil; light UE support Lateral step ups 2x10 bil; light UE support Seated LAQ 2x10; 5 sec hold with 5#; Lt   Modalities Vaso Left Knee mod 34* 10 minutes   06/25/2024 Therapeutic Exercise Recumbent bike seat 5, x 8 min; partial revolutions Knee flexion stretch standing LLE on 8 step 10 sec 5 reps. Seated LLE LAQ 5# 2 sets x 15 reps Seated LLE hamstring curl blue theraband 2 sets x 15 reps  Therapeutic Activities: Leg press bil (RLE higher on platform due to knee limitations) 100# 2 sets x 15 reps; then LLE 75# 2 sets x 15 reps  Manual Therapy: PROM with overpressure & tibial int rot  Modalities Vaso x 10 min left knee medium compression 34* with  elevation   06/21/2024 Recumbent bike Seat 5 for active assisted range only 5 minutes Quad sets 2 sets of 10 for 5 seconds Seated knee flexion and TKE 20 x 5 seconds in each direction  Functional Activities: Single Leg Press: 75# 2 sets of 15 with slow eccentrics  97535: Comprehensive review of current HEP with cues to avoid back flare-ups  Vaso Left Knee High 34* 10 minutes   06/18/24 TherAct Recumbent bike seat 5, x 8 min; partial revolutions Leg press bil 100# 3x10; then LLE 75# 3x10 Forward step ups onto 6 step 2x10; bil UE support  Neuro Re-Ed Sidestepping on foam x 5 laps; UE support needed Tandem walking forward/backward x 5 laps; UE support needed  Modalities Vaso x 10 min; mod pressure in elevation to Lt knee; 34 deg    PATIENT  EDUCATION:  Education details: HEP Person educated: Patient Education method: Programmer, multimedia, Facilities manager, and Handouts Education comprehension: verbalized understanding, returned demonstration, and needs further education  HOME EXERCISE PROGRAM: Access Code: [email or web address] URL: [email or web address] Date: 06/05/2024 Prepared by: Grayce Spatz  Exercises - Supine Heel Slide with Strap  - 5-10 x daily - 7 x weekly - 1 sets - 5-10 reps - Quad Set  - 5-10 x daily - 7 x weekly - 1 sets - 5-10 reps - 5 sec hold - Seated Knee Extension AROM  - 3-5 x daily - 7 x weekly - 1 sets - 10 reps - 5 sec hold - Seated Knee Flexion AAROM  - 3-5 x daily - 7 x weekly - 1 sets - 10 reps - 5-10 sec hold - Gastroc Stretch on Step  - 1-2 x daily - 7 x weekly - 1 sets - 3 reps - 30 seconds hold - Seated Table Hamstring Stretch  - 1-2 x daily - 7 x weekly - 1 sets - 3 reps - 30 seconds hold - Supine Quadriceps Stretch with Strap on Table  - 1-2 x daily - 7 x weekly - 1 sets - 3 reps - 30 seconds hold   ASSESSMENT: CLINICAL IMPRESSION: Pt tolerated session well today and feel he is nearing d/c from PT.  Will continue to benefit from PT to  maximize function.   OBJECTIVE IMPAIRMENTS: Abnormal gait, decreased balance, decreased mobility, difficulty walking, decreased ROM, decreased strength, hypomobility, increased edema, increased muscle spasms, and pain.   ACTIVITY LIMITATIONS: sitting, standing, squatting, sleeping, stairs, transfers, bed mobility, and locomotion level  PARTICIPATION LIMITATIONS: meal prep, cleaning, laundry, driving, shopping, community activity, occupation, and yard work  PERSONAL FACTORS: Age, Past/current experiences, Time since onset of injury/illness/exacerbation, and 3+ comorbidities: anxiety, arthritis, afib, CVA, R TKR, cervical discectomy, depression are also affecting patient's functional outcome.   REHAB POTENTIAL: Good  CLINICAL DECISION MAKING: Stable/uncomplicated  EVALUATION COMPLEXITY: Low   GOALS: Goals reviewed with patient? Yes  SHORT TERM GOALS: Target date: 06/20/2024  Independent with initial HEP Goal status: MET    06/12/2024  2.  Lt knee AROM improved to 0-100 for improved mobility Goal status: MET  06/12/2024   LONG TERM GOALS: Target date: 07/18/2024   Independent with final HEP Goal status: Ongoing     06/21/2024  2.  PSFS score improved by 3 points Goal status: Ongoing    06/12/2024  3.  Lt knee AROM improved to 0-110 for improved mobility and function Goal status:  Ongoing    06/21/2024  4.  Report pain < 3/10 with standing and walking for improved function Goal status: Ongoing    06/21/2024  5.  Amb independently without AD or significant gait abnormalities for improved function and mobility Goal status: Ongoing   06/21/2024  PLAN:  PT FREQUENCY: 2x/week  PT DURATION: 8 weeks  PLANNED INTERVENTIONS: 97164- PT Re-evaluation, 97750- Physical Performance Testing, 97110-Therapeutic exercises, 97530- Therapeutic activity, 97112- Neuromuscular re-education, 97535- Self Care, 02859- Manual therapy, [phone]- Gait training, [phone]- Aquatic Therapy, [phone]- Electrical  stimulation (unattended), 97016- Vasopneumatic device, 20560 (1-2 muscles), 20561 (3+ muscles)- Dry Needling, Patient/Family education, Balance training, Stair training, Taping, Joint mobilization, Scar mobilization, DME instructions, Cryotherapy, and Moist heat.  PLAN FOR NEXT SESSION: likely d/c (10th visit)  Continue emphasis on quadriceps strength, knee AROM and edema control while avoiding flaring-up the back and right knee.   NEXT MD VISIT: 06/26/24   Corean JULIANNA Ku, PT, DPT 06/28/24 1:44 PM  Referring diagnosis? M17.12 Treatment diagnosis? (if different than referring diagnosis) M25.562, M25.662, M62.81, R60.0, R26.81, R26.89 What was this (referring dx) caused by? [x]  Surgery []  Fall []  Ongoing issue [x]  Arthritis []  Other: ____________  Laterality: []  Rt [x]  Lt []  Both  Check all possible CPT codes:  *CHOOSE 10 OR LESS*    See Planned Interventions listed in the Plan section of the Evaluation.

## 2024-06-29 ENCOUNTER — Other Ambulatory Visit: Payer: Self-pay | Admitting: Adult Health

## 2024-06-29 DIAGNOSIS — F5101 Primary insomnia: Secondary | ICD-10-CM

## 2024-06-29 DIAGNOSIS — F39 Unspecified mood [affective] disorder: Secondary | ICD-10-CM

## 2024-07-02 ENCOUNTER — Other Ambulatory Visit: Payer: Self-pay | Admitting: Adult Health

## 2024-07-02 DIAGNOSIS — F5101 Primary insomnia: Secondary | ICD-10-CM

## 2024-07-02 NOTE — Telephone Encounter (Unsigned)
 Copied from CRM [id number]. Topic: Clinical - Medication Refill >> Jul 02, 2024 12:29 PM Thersia C wrote: Medication: apixaban  (ELIQUIS ) 5 MG TABS tablet  Has the patient contacted their pharmacy? Yes (Agent: If no, request that the patient contact the pharmacy for the refill. If patient does not wish to contact the pharmacy document the reason why and proceed with request.) (Agent: If yes, when and what did the pharmacy advise?)  This is the patient's preferred pharmacy:  [id number] - [place], [place] - [address] AT [facility] [address] [place] [address] Phone: [phone] Fax: [phone]   Is this the correct pharmacy for this prescription? Yes If no, delete pharmacy and type the correct one.   Has the prescription been filled recently? No  Is the patient out of the medication? Yes  Has the patient been seen for an appointment in the last year OR does the patient have an upcoming appointment? Yes  Can we respond through MyChart? Yes  Agent: Please be advised that Rx refills may take up to 3 business days. We ask that you follow-up with your pharmacy.

## 2024-07-02 NOTE — Telephone Encounter (Unsigned)
 Copied from CRM #[id number]. Topic: Clinical - Medication Refill >> Jul 02, 2024 11:34 AM Vena HERO wrote: Medication: Suvorexant (BELSOMRA) 15 MG TABS  Has the patient contacted their pharmacy? No (Agent: If no, request that the patient contact the pharmacy for the refill. If patient does not wish to contact the pharmacy document the reason why and proceed with request.) (Agent: If yes, when and what did the pharmacy advise?)  This is the patient's preferred pharmacy:  [id number] - [place], [place] - [address] AT [facility] [address]  [place] [address] Phone: [phone] Fax: [phone]   Is this the correct pharmacy for this prescription? Yes If no, delete pharmacy and type the correct one.   Has the prescription been filled recently? No  Is the patient out of the medication? Yes  Has the patient been seen for an appointment in the last year OR does the patient have an upcoming appointment? Yes  Can we respond through MyChart? No, phone call preferred  Agent: Please be advised that Rx refills may take up to 3 business days. We ask that you follow-up with your pharmacy.

## 2024-07-03 MED ORDER — APIXABAN 5 MG PO TABS: 5.0000 mg | ORAL_TABLET | Freq: Two times a day (BID) | ORAL | 0 refills | Status: DC

## 2024-07-04 ENCOUNTER — Telehealth: Payer: Self-pay | Admitting: Orthopaedic Surgery

## 2024-07-04 ENCOUNTER — Other Ambulatory Visit: Payer: Self-pay | Admitting: Physician Assistant

## 2024-07-04 MED ORDER — HYDROCODONE-ACETAMINOPHEN 5-325 MG PO TABS
1.0000 | ORAL_TABLET | Freq: Every day | ORAL | 0 refills | Status: DC | PRN
Start: 2024-07-04 — End: 2024-07-11

## 2024-07-04 NOTE — Telephone Encounter (Signed)
 Patient called. He would like a refill on his oxycodone .

## 2024-07-04 NOTE — Telephone Encounter (Signed)
 Refilled norco with diff instructions

## 2024-07-05 ENCOUNTER — Ambulatory Visit: Admitting: Physical Therapy

## 2024-07-05 DIAGNOSIS — R2681 Unsteadiness on feet: Secondary | ICD-10-CM

## 2024-07-05 DIAGNOSIS — M25562 Pain in left knee: Secondary | ICD-10-CM

## 2024-07-05 DIAGNOSIS — R600 Localized edema: Secondary | ICD-10-CM | POA: Diagnosis not present

## 2024-07-05 DIAGNOSIS — M6281 Muscle weakness (generalized): Secondary | ICD-10-CM | POA: Diagnosis not present

## 2024-07-05 DIAGNOSIS — R2689 Other abnormalities of gait and mobility: Secondary | ICD-10-CM

## 2024-07-05 DIAGNOSIS — M25662 Stiffness of left knee, not elsewhere classified: Secondary | ICD-10-CM

## 2024-07-05 NOTE — Therapy (Signed)
 OUTPATIENT PHYSICAL THERAPY TREATMENT PROGRESS NOTE DISCHARGE SUMMARY    Progress Note Reporting Period 05/23/24 to 07/05/24  See note below for Objective Data and Assessment of Progress/Goals.       Patient Name: Cristian Gonzales MRN: [id number] DOB:[birth year], 68 y.o., male Today's Date: 07/05/2024      END OF SESSION:  PT End of Session - 07/05/24 0912     Visit Number 10    Authorization Type [employer] $25 copay    Authorization Time Period [employer] $25 COPAY  Approved  Authorization #[id number]  Tracking #[id number]  05/23/2024-07/18/2024  10 PT visits    PT Start Time 0844    PT Stop Time 0912    PT Time Calculation (min) 28 min    Activity Tolerance Patient tolerated treatment well;No increased pain;Patient limited by pain    Behavior During Therapy [facility] for tasks assessed/performed                   Past Medical History:  Diagnosis Date   Anxiety    Arthritis    Cervical spine fracture (HCC)    13 -diving accident, no neurolgic sequelae   Depression    Paroxysmal atrial fibrillation (HCC)    Pneumonia    x several   Stroke (HCC)    Syncope 11/27/2012   From Tramadol    Past Surgical History:  Procedure Laterality Date   BUBBLE STUDY  03/19/2021   Procedure: BUBBLE STUDY;  Surgeon: Delford Maude BROCKS, MD;  Location: [facility] ENDOSCOPY;  Service: Cardiovascular;;   CERVICAL DISCECTOMY  09/06/2002   dr gaither   EYE SURGERY Bilateral    cataracts removed ([facility])   KNEE SURGERY  09/07/1979   right   LOOP RECORDER INSERTION N/A 03/19/2021   Procedure: LOOP RECORDER INSERTION;  Surgeon: Cindie Ole DASEN, MD;  Location: [facility] INVASIVE CV LAB;  Service: Cardiovascular;  Laterality: N/A;   LUMBAR LAMINECTOMY/DECOMPRESSION MICRODISCECTOMY N/A 07/27/2023   Procedure: Thoracic Eleven-Twelve Decompression;  Surgeon: Onetha Kuba, MD;  Location[facility] OR;  Service: Neurosurgery;  Laterality: N/A;   NECK SURGERY     REPLACEMENT TOTAL KNEE     right    REPLACEMENT TOTAL KNEE     TEE WITHOUT CARDIOVERSION N/A 03/19/2021   Procedure: TRANSESOPHAGEAL ECHOCARDIOGRAM (TEE);  Surgeon: Delford Maude BROCKS, MD;  Location: [facility] ENDOSCOPY;  Service: Cardiovascular;  Laterality: N/A;   TOTAL KNEE ARTHROPLASTY Left 05/14/2024   Procedure: ARTHROPLASTY, KNEE, TOTAL;  Surgeon: Jerri Kay CHRISTELLA, MD;  Location: [facility] OR;  Service: Orthopedics;  Laterality: Left;   Patient Active Problem List   Diagnosis Date Noted   Status post total left knee replacement 05/14/2024   Primary osteoarthritis of left knee 02/07/2024   Body mass index 40.0-44.9, adult (HCC) 02/07/2024   Myelopathy (HCC) 07/27/2023   Spinal stenosis of lumbar region 07/25/2023   Cord compression myelopathy (HCC) 07/25/2023   Paroxysmal atrial fibrillation (HCC) 07/25/2023   Essential hypertension 07/25/2023   Obesity (BMI 30-39.9) 07/25/2023   Atrial fibrillation with RVR (HCC) 02/07/2023   Elevated troponin 02/07/2023   Hypokalemia 02/07/2023   SIRS (systemic inflammatory response syndrome) (HCC) 02/07/2023   Pulmonary nodule 02/07/2023   Chronic diastolic CHF (congestive heart failure) (HCC) 02/07/2023   Chest pain 02/06/2023   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   Hyperlipidemia 07/30/2015   Lumbar disc disease with radiculopathy 07/29/2014   Left-sided thoracic back pain 06/05/2014   Numbness and tingling sensation of skin 06/25/2013   Syncope 11/27/2012   Depression 11/27/2012  Epididymitis, left 11/23/2010   Allergic rhinitis 11/07/2009   Extrinsic asthma 02/05/2008   Anxiety state 08/08/2007    PCP: Merna Huxley, NP  REFERRING PROVIDER: Jerri Kay HERO, MD  REFERRING DIAG: [phone] (ICD-10-CM) - Primary osteoarthritis of left knee   Rationale for Evaluation and Treatment: Rehabilitation  THERAPY DIAG:  Acute pain of left knee  Stiffness of left knee, not elsewhere classified  Muscle weakness (generalized)  Localized edema  Unsteadiness on feet  Other abnormalities of  gait and mobility  ONSET DATE: 05/14/24 left TKA   SUBJECTIVE:                                                                                                                                                                                           SUBJECTIVE STATEMENT: Lt knee is doing well; feels comfortable with d/c today. SABRA    PERTINENT HISTORY:  Left TKA 05/14/24, anxiety, arthritis, afib, CVA, R TKR, cervical discectomy, depression  PAIN:  Are you having pain? Yes: NPRS scale: in last week  0-5/10 during day and laying down to sleep up to 7-8/10 Pain location: Lt knee and hip Pain description: sharp, aching, sore, deep Aggravating factors: standing, walking Relieving factors: sitting, resting  PRECAUTIONS:  None  RED FLAGS: None   WEIGHT BEARING RESTRICTIONS:  No  FALLS:  Has patient fallen in last 6 months? No  LIVING ENVIRONMENT: Lives with: lives with their spouse Lives in: House/apartment Stairs: Yes: External: 1 steps; none Has following equipment at home: Single point cane and Walker - 2 wheeled  OCCUPATION:  Retired - set designer  PLOF:  Independent and Leisure: smoke cigars, go fishing, belongs to [employer] - goes 4 days a week; walks dog 1/2 mile every morning  PATIENT GOALS:  Improve pain, flexion in knee   OBJECTIVE:  Note: Objective measures were completed at Evaluation unless otherwise noted.   PATIENT SURVEYS:  Patient-Specific Activity Scoring Scheme  0 represents "unable to perform." 10 represents "able to perform at prior level. 0 1 2 3 4 5 6 7 8 9  10 (Date and Score)   Activity Eval   06/12/24 07/05/24  1. Walking 3  5  6   2. Bending knee  3  5 9   3. Fishing 3 3 7   4. Cigar lounge 3 5 10   5. Going out to dinner 3 6 10   Score 3 4.8 8.4   Total score = sum of the activity scores/number of activities Minimum detectable change (90%CI) for average score = 2 points Minimum detectable change (90%CI) for single activity score = 3  points  COGNITIVE STATUS: Within functional limits for tasks  assessed   GAIT: 05/23/24 Distance walked: 100' within clinic Assistive device utilized: Single point cane Level of assistance: Modified independence Comments: decreased stance on LLE, decr Lt hip/knee flexion  EDEMA: 05/23/24  Knee Joint Line: Rt: 44 cm/Lt: 47.4 cm   LOWER EXTREMITY ROM:     ROM Right eval Left eval Left 06/01/24 Left 06/08/24 Left 06/12/24 Left 06/18/24 Left/Right 06/21/2024 Left 06/25/24 Left 06/28/24 Left 07/05/24  Knee flexion A: 69 A: 88 P: 95 100/103 A: 100 Seated A: 100* P: 107* A: 104 104/57 Supine A: 107* P: 110* A: 104 A: 111  Knee extension A: -5 A: -5 (seated LAQ) P: 0     2/5 LAQ -2* Standing A:0*  A: 0 (seated LAQ)   (Blank rows = not tested)   LOWER EXTREMITY MMT:    05/23/24: Not formally tested; Lt knee grossly 3-/5   MMT Body weight (BW) 225# Right 06/25/24 Left 06/25/24 Left 07/05/24  Knee flexion     Knee extension HH dynameter 59.9#, 63.7# RLE 27.4% of BW  HH dynameter 42#, 46# LLE:RLE 72% LLE 19.6% of BW    (Blank rows = not tested)    TREATMENT:   L TKA                                                                                                                    DATE:  07/05/24 TherAct SciFit UE/LEs L1 partial revolutions (due to limited Rt ROM) x 8 min; seat 9 AA heel slides x 20 reps  TherEx Pt without questions of HEP, encouraged walking and continued HEP at home ROM measurements - see above for details  06/28/24 TherAct SciFit UE/LEs L1 partial revolutions x 8 min  Leg press 100# 3x10 (RLE higher on foot plate) Forward step ups 2x10 bil; light UE support Lateral step ups 2x10 bil; light UE support Seated LAQ 2x10; 5 sec hold with 5#; Lt   Modalities Vaso Left Knee mod 34* 10 minutes   06/25/2024 Therapeutic Exercise Recumbent bike seat 5, x 8 min; partial revolutions Knee flexion stretch standing LLE on 8 step 10 sec 5  reps. Seated LLE LAQ 5# 2 sets x 15 reps Seated LLE hamstring curl blue theraband 2 sets x 15 reps  Therapeutic Activities: Leg press bil (RLE higher on platform due to knee limitations) 100# 2 sets x 15 reps; then LLE 75# 2 sets x 15 reps  Manual Therapy: PROM with overpressure & tibial int rot  Modalities Vaso x 10 min left knee medium compression 34* with elevation   06/21/2024 Recumbent bike Seat 5 for active assisted range only 5 minutes Quad sets 2 sets of 10 for 5 seconds Seated knee flexion and TKE 20 x 5 seconds in each direction  Functional Activities: Single Leg Press: 75# 2 sets of 15 with slow eccentrics  97535: Comprehensive review of current HEP with cues to avoid back flare-ups  Vaso Left Knee High 34* 10 minutes   06/18/24 TherAct Recumbent bike seat 5, x  8 min; partial revolutions Leg press bil 100# 3x10; then LLE 75# 3x10 Forward step ups onto 6 step 2x10; bil UE support  Neuro Re-Ed Sidestepping on foam x 5 laps; UE support needed Tandem walking forward/backward x 5 laps; UE support needed  Modalities Vaso x 10 min; mod pressure in elevation to Lt knee; 34 deg    PATIENT EDUCATION:  Education details: HEP Person educated: Patient Education method: Programmer, Multimedia, Facilities Manager, and Handouts Education comprehension: verbalized understanding, returned demonstration, and needs further education  HOME EXERCISE PROGRAM: Access Code: [email or web address] URL: [email or web address] Date: 06/05/2024 Prepared by: Grayce Spatz  Exercises - Supine Heel Slide with Strap  - 5-10 x daily - 7 x weekly - 1 sets - 5-10 reps - Quad Set  - 5-10 x daily - 7 x weekly - 1 sets - 5-10 reps - 5 sec hold - Seated Knee Extension AROM  - 3-5 x daily - 7 x weekly - 1 sets - 10 reps - 5 sec hold - Seated Knee Flexion AAROM  - 3-5 x daily - 7 x weekly - 1 sets - 10 reps - 5-10 sec hold - Gastroc Stretch on Step  - 1-2 x daily - 7 x weekly - 1 sets - 3 reps - 30  seconds hold - Seated Table Hamstring Stretch  - 1-2 x daily - 7 x weekly - 1 sets - 3 reps - 30 seconds hold - Supine Quadriceps Stretch with Strap on Table  - 1-2 x daily - 7 x weekly - 1 sets - 3 reps - 30 seconds hold   ASSESSMENT: CLINICAL IMPRESSION: Pt has met all goals and is ready for d/c from PT at this time.  Has HEP to continue at home.     OBJECTIVE IMPAIRMENTS: Abnormal gait, decreased balance, decreased mobility, difficulty walking, decreased ROM, decreased strength, hypomobility, increased edema, increased muscle spasms, and pain.   ACTIVITY LIMITATIONS: sitting, standing, squatting, sleeping, stairs, transfers, bed mobility, and locomotion level  PARTICIPATION LIMITATIONS: meal prep, cleaning, laundry, driving, shopping, community activity, occupation, and yard work  PERSONAL FACTORS: Age, Past/current experiences, Time since onset of injury/illness/exacerbation, and 3+ comorbidities: anxiety, arthritis, afib, CVA, R TKR, cervical discectomy, depression are also affecting patient's functional outcome.   REHAB POTENTIAL: Good  CLINICAL DECISION MAKING: Stable/uncomplicated  EVALUATION COMPLEXITY: Low   GOALS: Goals reviewed with patient? Yes  SHORT TERM GOALS: Target date: 06/20/2024  Independent with initial HEP Goal status: MET    06/12/2024  2.  Lt knee AROM improved to 0-100 for improved mobility Goal status: MET  06/12/2024   LONG TERM GOALS: Target date: 07/18/2024   Independent with final HEP Goal status: MET 07/05/24  2.  PSFS score improved by 3 points Goal status: MET 07/05/24  3.  Lt knee AROM improved to 0-110 for improved mobility and function Goal status:  MET 07/05/24  4.  Report pain < 3/10 with standing and walking for improved function Goal status: MET 07/05/24  5.  Amb independently without AD or significant gait abnormalities for improved function and mobility Goal status: MET 07/05/24  PLAN:  PT FREQUENCY: 2x/week  PT  DURATION: 8 weeks  PLANNED INTERVENTIONS: 97164- PT Re-evaluation, 97750- Physical Performance Testing, 97110-Therapeutic exercises, 97530- Therapeutic activity, 97112- Neuromuscular re-education, 97535- Self Care, 02859- Manual therapy, [phone]- Gait training, [phone]- Aquatic Therapy, [phone]- Electrical stimulation (unattended), 97016- Vasopneumatic device, 20560 (1-2 muscles), 20561 (3+ muscles)- Dry Needling, Patient/Family education, Balance training, Stair training, Taping, Joint mobilization, Scar mobilization, DME instructions, Cryotherapy,  and Moist heat.  PLAN FOR NEXT SESSION: d/c PT today    NEXT MD VISIT: 06/26/24   Corean JULIANNA Ku, PT, DPT 07/05/24 9:14 AM   PHYSICAL THERAPY DISCHARGE SUMMARY  Visits from Start of Care: 10  Current functional level related to goals / functional outcomes: See above   Remaining deficits: See above   Education / Equipment: HEP   Patient agrees to discharge. Patient goals were met. Patient is being discharged due to meeting the stated rehab goals.  Corean JULIANNA Ku, PT, DPT 07/05/24 9:14 AM  [facility] [address] [place], [place], [address] Phone: [phone]   Fax:  [phone]

## 2024-07-09 ENCOUNTER — Encounter

## 2024-07-09 ENCOUNTER — Encounter: Payer: Self-pay | Admitting: Radiology

## 2024-07-11 ENCOUNTER — Other Ambulatory Visit: Payer: Self-pay | Admitting: Physician Assistant

## 2024-07-11 ENCOUNTER — Telehealth: Payer: Self-pay | Admitting: Orthopaedic Surgery

## 2024-07-11 MED ORDER — HYDROCODONE-ACETAMINOPHEN 5-325 MG PO TABS: 1.0000 | ORAL_TABLET | Freq: Every day | ORAL | 0 refills | Status: DC | PRN

## 2024-07-11 NOTE — Telephone Encounter (Signed)
 Patient called and said he needs a refill on hydrocodone . CB#[phone]

## 2024-07-11 NOTE — Telephone Encounter (Signed)
 Sent in a small rx

## 2024-07-12 ENCOUNTER — Encounter: Admitting: Rehabilitative and Restorative Service Providers"

## 2024-07-16 DIAGNOSIS — H538 Other visual disturbances: Secondary | ICD-10-CM | POA: Diagnosis not present

## 2024-07-16 DIAGNOSIS — Z961 Presence of intraocular lens: Secondary | ICD-10-CM | POA: Diagnosis not present

## 2024-07-19 ENCOUNTER — Encounter: Admitting: Rehabilitative and Restorative Service Providers"

## 2024-07-26 ENCOUNTER — Encounter: Admitting: Rehabilitative and Restorative Service Providers"

## 2024-07-26 ENCOUNTER — Other Ambulatory Visit: Payer: Self-pay | Admitting: Neurosurgery

## 2024-07-27 ENCOUNTER — Telehealth: Payer: Self-pay

## 2024-07-27 ENCOUNTER — Telehealth (HOSPITAL_BASED_OUTPATIENT_CLINIC_OR_DEPARTMENT_OTHER): Payer: Self-pay

## 2024-07-27 NOTE — Telephone Encounter (Signed)
 Patient with diagnosis of atrial fibrillation on Eliquis  for anticoagulation.    Procedure:  L2-3, L3-4, L4-5 Lumbar Laminectomy   Date of Surgery:  Clearance 08/13/24     CHA2DS2-VASc Score = 5   This indicates a 7.2% annual risk of stroke. The patient's score is based upon: CHF History: 1 HTN History: 1 Diabetes History: 0 Stroke History: 2 Vascular Disease History: 0 Age Score: 1 Gender Score: 0    CrCl 90 Platelet count 300  Patient has not had an Afib/aflutter ablation in the last 3 months, DCCV within the last 4 weeks or a watchman implanted in the last 45 days   Per office protocol, patient can hold Eliquis  for 3 days prior to procedure.   Patient will not need bridging with Lovenox  (enoxaparin ) around procedure.  **This guidance is not considered finalized until pre-operative APP has relayed final recommendations.**

## 2024-07-27 NOTE — Telephone Encounter (Signed)
 Pharmacy please advise on holding Eliquis  prior to L2-3, L3-4, L4-5 Lumbar Laminectomy  scheduled for 08/13/2024. Last labs 05/09/2024. Thank you.

## 2024-07-27 NOTE — Telephone Encounter (Signed)
   Name: Cristian Gonzales  DOB: 06-12-1956  MRN: [id number]  Primary Cardiologist: None   Preoperative team, please contact this patient and set up a phone call appointment for further preoperative risk assessment. Please obtain consent and co Has appt scheduled with Dr. Kate on 09/04/2024 but procedure is on 08/13/2024.    I confirm that guidance regarding antiplatelet and oral anticoagulation therapy has been completed and, if necessary, noted below.  Pharmacy recommendations for Eliquis  hold is requested.   I also confirmed the patient resides in the state of [place] . As per [facility] Medical Board telemedicine laws, the patient must reside in the state in which the provider is licensed.   Lamarr Satterfield, NP 07/27/2024, 9:23 AM [facility]

## 2024-07-27 NOTE — Telephone Encounter (Signed)
 Preop tele appt now scheduled, med rec and consent done.

## 2024-07-27 NOTE — Telephone Encounter (Signed)
   Pre-operative Risk Assessment    Patient Name: Cristian Gonzales  DOB: 12/11/1955 MRN: [id number]   Date of last office visit: 02/27/24 with Dr. Kate Date of next office visit: 09/04/2024 with Dr. Kate  Request for Surgical Clearance    Procedure:  L2-3, L3-4, L4-5 Lumbar Laminectomy  Date of Surgery:  Clearance 08/13/24                                 Surgeon:  Dr. Arley SQUIBB. Onetha Socks Group or Practice Name:  [facility] Phone number:  [phone] Fax number:  [phone]   Type of Clearance Requested:   - Medical  - Pharmacy:  Hold Apixaban  (Eliquis ) -Does not specify   Type of Anesthesia:  General    Additional requests/questions:  None  Signed, Patrcia Iverson CROME   07/27/2024, 9:02 AM

## 2024-07-27 NOTE — Telephone Encounter (Signed)
  Patient Consent for Virtual Visit        Cristian Gonzales has provided verbal consent on 07/27/2024 for a virtual visit (video or telephone).   CONSENT FOR VIRTUAL VISIT FOR:  Cristian Gonzales  By participating in this virtual visit I agree to the following:  I hereby voluntarily request, consent and authorize [facility] and its employed or contracted physicians, physician assistants, nurse practitioners or other licensed health care professionals (the Practitioner), to provide me with telemedicine health care services (the "Services) as deemed necessary by the treating Practitioner. I acknowledge and consent to receive the Services by the Practitioner via telemedicine. I understand that the telemedicine visit will involve communicating with the Practitioner through live audiovisual communication technology and the disclosure of certain medical information by electronic transmission. I acknowledge that I have been given the opportunity to request an in-person assessment or other available alternative prior to the telemedicine visit and am voluntarily participating in the telemedicine visit.  I understand that I have the right to withhold or withdraw my consent to the use of telemedicine in the course of my care at any time, without affecting my right to future care or treatment, and that the Practitioner or I may terminate the telemedicine visit at any time. I understand that I have the right to inspect all information obtained and/or recorded in the course of the telemedicine visit and may receive copies of available information for a reasonable fee.  I understand that some of the potential risks of receiving the Services via telemedicine include:  Delay or interruption in medical evaluation due to technological equipment failure or disruption; Information transmitted may not be sufficient (e.g. poor resolution of images) to allow for appropriate medical decision making by the  Practitioner; and/or  In rare instances, security protocols could fail, causing a breach of personal health information.  Furthermore, I acknowledge that it is my responsibility to provide information about my medical history, conditions and care that is complete and accurate to the best of my ability. I acknowledge that Practitioner's advice, recommendations, and/or decision may be based on factors not within their control, such as incomplete or inaccurate data provided by me or distortions of diagnostic images or specimens that may result from electronic transmissions. I understand that the practice of medicine is not an exact science and that Practitioner makes no warranties or guarantees regarding treatment outcomes. I acknowledge that a copy of this consent can be made available to me via my patient portal [facility] MyChart), or I can request a printed copy by calling the office of [facility].    I understand that my insurance will be billed for this visit.   I have read or had this consent read to me. I understand the contents of this consent, which adequately explains the benefits and risks of the Services being provided via telemedicine.  I have been provided ample opportunity to ask questions regarding this consent and the Services and have had my questions answered to my satisfaction. I give my informed consent for the services to be provided through the use of telemedicine in my medical care

## 2024-08-01 ENCOUNTER — Ambulatory Visit: Attending: Internal Medicine | Admitting: Cardiology

## 2024-08-01 DIAGNOSIS — Z01810 Encounter for preprocedural cardiovascular examination: Secondary | ICD-10-CM | POA: Diagnosis not present

## 2024-08-01 DIAGNOSIS — Z01818 Encounter for other preprocedural examination: Secondary | ICD-10-CM

## 2024-08-01 NOTE — Progress Notes (Signed)
 Virtual Visit via Telephone Note   Because of Cristian Gonzales co-morbid illnesses, he is at least at moderate risk for complications without adequate follow up.  This format is felt to be most appropriate for this patient at this time.  Due to technical limitations with video connection (technology), today's appointment will be conducted as an audio only telehealth visit, and Cristian Gonzales verbally agreed to proceed in this manner.   All issues noted in this document were discussed and addressed.  No physical exam could be performed with this format.  Evaluation Performed:  Preoperative cardiovascular risk assessment _____________   Date:  08/01/2024   Patient ID:  Cristian Gonzales, DOB 06/22/1956, MRN [id number] Patient Location:  Home Provider location:   Office  Primary Care Provider:  Merna Huxley, NP Primary Cardiologist:  Lonni LITTIE Nanas, MD  Chief Complaint / Patient Profile   68 y.o. y/o male with a h/o hx of atrial fibrillation, CVA, chronic diastolic heart failure, hyperlipidemia  who is pending L2-3, L3-4, L4-5 Lumbar Laminectomy  and presents today for telephonic preoperative cardiovascular risk assessment.  History of Present Illness    Cristian Gonzales is a 68 y.o. male who presents via audio/video conferencing for a telehealth visit today.  Pt was last seen in cardiology clinic on 02/27/2024 by Dr. Marlyn.  At that time Cristian Gonzales was doing well, going to the gym multiple days per week. The patient is now pending procedure as outlined above. Since his last visit, he has been well from a cardiac perspective, dealing with back and knee pain, but still walks his dog for exercise every day. He denies chest pain, palpitations, dyspnea, pnd, orthopnea, n, v, dizziness, syncope, edema, weight gain, or early satiety.     Past Medical History    Past Medical History:  Diagnosis Date   Anxiety    Arthritis    Cervical spine fracture (HCC)    13 -diving  accident, no neurolgic sequelae   Depression    Paroxysmal atrial fibrillation (HCC)    Pneumonia    x several   Stroke (HCC)    Syncope 11/27/2012   From Tramadol    Past Surgical History:  Procedure Laterality Date   BUBBLE STUDY  03/19/2021   Procedure: BUBBLE STUDY;  Surgeon: Delford Maude BROCKS, MD;  Location: [facility] ENDOSCOPY;  Service: Cardiovascular;;   CERVICAL DISCECTOMY  09/06/2002   dr gaither   EYE SURGERY Bilateral    cataracts removed ([facility])   KNEE SURGERY  09/07/1979   right   LOOP RECORDER INSERTION N/A 03/19/2021   Procedure: LOOP RECORDER INSERTION;  Surgeon: Cindie Ole DASEN, MD;  Location: [facility] INVASIVE CV LAB;  Service: Cardiovascular;  Laterality: N/A;   LUMBAR LAMINECTOMY/DECOMPRESSION MICRODISCECTOMY N/A 07/27/2023   Procedure: Thoracic Eleven-Twelve Decompression;  Surgeon: Onetha Kuba, MD;  Location: [facility] OR;  Service: Neurosurgery;  Laterality: N/A;   NECK SURGERY     REPLACEMENT TOTAL KNEE     right   REPLACEMENT TOTAL KNEE     TEE WITHOUT CARDIOVERSION N/A 03/19/2021   Procedure: TRANSESOPHAGEAL ECHOCARDIOGRAM (TEE);  Surgeon: Delford Maude BROCKS, MD;  Location: [facility] ENDOSCOPY;  Service: Cardiovascular;  Laterality: N/A;   TOTAL KNEE ARTHROPLASTY Left 05/14/2024   Procedure: ARTHROPLASTY, KNEE, TOTAL;  Surgeon: Jerri Kay HERO, MD;  Location: [facility] OR;  Service: Orthopedics;  Laterality: Left;    Allergies  Allergies  Allergen Reactions   Tramadol  Other (See Comments)    Passed out  Metoprolol  Other (See Comments)     Made my head feel like it was going to blow up    Morphine Other (See Comments)    Left red bumps on the back and did not work   Trazodone  And Nefazodone Other (See Comments)    Made the patient feel flushed,with blurred vision and muscle cramps     Home Medications    Prior to Admission medications   Medication Sig Start Date End Date Taking? Authorizing Provider  acetaminophen -codeine  (TYLENOL  #3) 300-30 MG tablet Take 1 tablet  by mouth 2 (two) times daily as needed. 06/20/24   Jule Ronal CROME, PA-C  apixaban  (ELIQUIS ) 5 MG TABS tablet TAKE 1 TABLET BY MOUTH TWICE A DAY 07/03/24   Nafziger, Darleene, NP  apixaban  (ELIQUIS ) 5 MG TABS tablet Take 1 tablet (5 mg total) by mouth 2 (two) times daily. 07/03/24   Nafziger, Darleene, NP  BELSOMRA  15 MG TABS TAKE 1 TABLET BY MOUTH AT BEDTIME AS NEEDED. 07/03/24   Nafziger, Darleene, NP  buPROPion  (WELLBUTRIN  XL) 150 MG 24 hr tablet TAKE 1 TABLET BY MOUTH EVERY DAY 07/03/24   Nafziger, Darleene, NP  carvedilol  (COREG ) 6.25 MG tablet Take 1 tablet (6.25 mg total) by mouth 2 (two) times daily with a meal. 03/16/24   Burchette, Wolm ORN, MD  docusate sodium  (COLACE) 100 MG capsule Take 1 capsule (100 mg total) by mouth 2 (two) times daily. 06/26/24   Jule Ronal CROME, PA-C  Evolocumab  (REPATHA  SURECLICK) 140 MG/ML SOAJ INJECT 140 MG INTO THE SKIN EVERY 14 (FOURTEEN) DAYS. 03/26/24   Kate Lonni CROME, MD  HYDROcodone -acetaminophen  (NORCO/VICODIN) 5-325 MG tablet Take 1-2 tablets by mouth daily as needed. 07/11/24   Jule Ronal CROME, PA-C  methocarbamol  (ROBAXIN ) 500 MG tablet Take 1 tablet (500 mg total) by mouth 2 (two) times daily as needed. 05/09/24   Jule Ronal CROME, PA-C  ondansetron  (ZOFRAN ) 4 MG tablet Take 1 tablet (4 mg total) by mouth every 8 (eight) hours as needed for nausea or vomiting. 05/09/24   Jule Ronal CROME, PA-C  oxyCODONE -acetaminophen  (PERCOCET) 5-325 MG tablet Take 1-2 tablets by mouth every 6 (six) hours as needed. To be taken after surgery 05/09/24   Jule Ronal CROME, PA-C    Physical Exam    Vital Signs:  Cristian Gonzales does not have vital signs available for review today.  Given telephonic nature of communication, physical exam is limited. AAOx3. NAD. Normal affect.  Speech and respirations are unlabored.  Accessory Clinical Findings    None  Assessment & Plan    1.  Preoperative Cardiovascular Risk Assessment:    Cristian Gonzales's perioperative risk of a major  cardiac event is 0.9% according to the Revised Cardiac Risk Index (RCRI).  Therefore, he is at low risk for perioperative complications.   His functional capacity is good at 5.07 METs according to the Duke Activity Status Index (DASI). Recommendations: According to ACC/AHA guidelines, no further cardiovascular testing needed.  The patient may proceed to surgery at acceptable risk.   Antiplatelet and/or Anticoagulation Recommendations:  Eliquis  (Apixaban ) can be held for 3 days prior to surgery.  Please resume post op when felt to be safe.      The patient was advised that if he develops new symptoms prior to surgery to contact our office to arrange for a follow-up visit, and he verbalized understanding.   A copy of this note will be routed to requesting surgeon.  Time:   Today, I have spent  10 minutes with the patient with telehealth technology discussing medical history, symptoms, and management plan.     Cristian JAYSON Hoover, NP  08/01/2024, 11:31 AM

## 2024-08-07 ENCOUNTER — Telehealth: Payer: Self-pay

## 2024-08-07 ENCOUNTER — Ambulatory Visit: Admitting: Orthopaedic Surgery

## 2024-08-07 DIAGNOSIS — Z96652 Presence of left artificial knee joint: Secondary | ICD-10-CM

## 2024-08-07 NOTE — Progress Notes (Signed)
 Post-Op Visit Note   Patient: Cristian Gonzales           Date of Birth: [birth year]           MRN: [id number] Visit Date: 08/07/2024 PCP: Merna Huxley, NP   Assessment & Plan:  Chief Complaint:  Chief Complaint  Patient presents with   Left Knee - Routine Post Op    L TKA- 05/14/2024   Visit Diagnoses:  1. Status post total left knee replacement     Plan: History of Present Illness Cristian Gonzales is a 68 year old male who presents for follow-up after left knee replacement surgery.  He has medial knee pain, most noticeable during morning walks with his dog. The pain has been gradually improving since surgery and is relieved by Tylenol .  He has a small itchy lump at the prior stitch site.  He stopped stronger painkillers due to concern about dependency. He now uses Tylenol  as needed and has difficulty sleeping because of pain.  He is scheduled for back surgery on Monday and feels fatigued from undergoing multiple surgeries.  Physical Exam MUSCULOSKELETAL: Left knee shows fully healed surgical scar.  Functional range of motion.  Mild soft tissue swelling.  No signs of infection.  Assessment and Plan Status post total left knee replacement Three months post-surgery with gradual pain improvement. Small lump at incision likely due to healing, associated with itching. Discontinued painkillers to avoid dependency. - Continue Tylenol  as needed for pain. - Follow-up in three months for six-month post-operative evaluation. - Scheduled to undergo back surgery with Dr. Onetha next week  Follow-Up Instructions: Return in about 3 months (around 11/05/2024).   Orders:  No orders of the defined types were placed in this encounter.  No orders of the defined types were placed in this encounter.   Imaging: No results found.  PMFS History: Patient Active Problem List   Diagnosis Date Noted   Status post total left knee replacement 05/14/2024   Primary osteoarthritis of left knee  02/07/2024   Body mass index 40.0-44.9, adult (HCC) 02/07/2024   Myelopathy (HCC) 07/27/2023   Spinal stenosis of lumbar region 07/25/2023   Cord compression myelopathy (HCC) 07/25/2023   Paroxysmal atrial fibrillation (HCC) 07/25/2023   Essential hypertension 07/25/2023   Obesity (BMI 30-39.9) 07/25/2023   Atrial fibrillation with RVR (HCC) 02/07/2023   Elevated troponin 02/07/2023   Hypokalemia 02/07/2023   SIRS (systemic inflammatory response syndrome) (HCC) 02/07/2023   Pulmonary nodule 02/07/2023   Chronic diastolic CHF (congestive heart failure) (HCC) 02/07/2023   Chest pain 02/06/2023   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   Hyperlipidemia 07/30/2015   Lumbar disc disease with radiculopathy 07/29/2014   Left-sided thoracic back pain 06/05/2014   Numbness and tingling sensation of skin 06/25/2013   Syncope 11/27/2012   Depression 11/27/2012   Epididymitis, left 11/23/2010   Allergic rhinitis 11/07/2009   Extrinsic asthma 02/05/2008   Anxiety state 08/08/2007   Past Medical History:  Diagnosis Date   Anxiety    Arthritis    Cervical spine fracture (HCC)    13 -diving accident, no neurolgic sequelae   Depression    Paroxysmal atrial fibrillation (HCC)    Pneumonia    x several   Stroke (HCC)    Syncope 11/27/2012   From Tramadol     Family History  Problem Relation Age of Onset   Heart attack Father 60       Sudden Cardiac Death   Liver cancer Mother 63  Past Surgical History:  Procedure Laterality Date   BUBBLE STUDY  03/19/2021   Procedure: BUBBLE STUDY;  Surgeon: Delford Maude BROCKS, MD;  Location: [facility] ENDOSCOPY;  Service: Cardiovascular;;   CERVICAL DISCECTOMY  09/06/2002   dr gaither   EYE SURGERY Bilateral    cataracts removed ([facility])   KNEE SURGERY  09/07/1979   right   LOOP RECORDER INSERTION N/A 03/19/2021   Procedure: LOOP RECORDER INSERTION;  Surgeon: Cindie Ole DASEN, MD;  Location: [facility] INVASIVE CV LAB;  Service: Cardiovascular;   Laterality: N/A;   LUMBAR LAMINECTOMY/DECOMPRESSION MICRODISCECTOMY N/A 07/27/2023   Procedure: Thoracic Eleven-Twelve Decompression;  Surgeon: Onetha Kuba, MD;  Location: [facility] OR;  Service: Neurosurgery;  Laterality: N/A;   NECK SURGERY     REPLACEMENT TOTAL KNEE     right   REPLACEMENT TOTAL KNEE     TEE WITHOUT CARDIOVERSION N/A 03/19/2021   Procedure: TRANSESOPHAGEAL ECHOCARDIOGRAM (TEE);  Surgeon: Delford Maude BROCKS, MD;  Location: [facility] ENDOSCOPY;  Service: Cardiovascular;  Laterality: N/A;   TOTAL KNEE ARTHROPLASTY Left 05/14/2024   Procedure: ARTHROPLASTY, KNEE, TOTAL;  Surgeon: Jerri Kay HERO, MD;  Location: [facility] OR;  Service: Orthopedics;  Laterality: Left;   Social History   Occupational History   Not on file  Tobacco Use   Smoking status: Some Days    Types: Cigars   Smokeless tobacco: Never   Tobacco comments:    Smokes 1 cigar twice weekly.  Vaping Use   Vaping status: Never Used  Substance and Sexual Activity   Alcohol use: Yes    Comment: occasional 6 pk beer   Drug use: No    Comment: Marijuana w/THC drinks taken qhs to help with sleep; informed to withhold 24 hrs prior to procedure   Sexual activity: Not Currently

## 2024-08-07 NOTE — Telephone Encounter (Unsigned)
 Copied from CRM #[id number]. Topic: Clinical - Prescription Issue >> Aug 06, 2024 12:10 PM Kevelyn M wrote: Reason for CRM: Patient called because the BELSOMRA  15 MG TABS made him jittery and had the opposite effect. He's requesting another sleep aid with the consideration that is he is having surgery on the 08/13/2024. Please call patient back.  Call back: [phone]

## 2024-08-08 NOTE — Telephone Encounter (Signed)
 Patient notified of update  and verbalized understanding. Pt stated that he will wait til after back surgery for referral.

## 2024-08-08 NOTE — Telephone Encounter (Unsigned)
 Copied from CRM [id number]. Topic: General - Call Back - No Documentation >> Aug 08, 2024  9:25 AM Frederich PARAS wrote: Reason for CRM: pt called due to missed call., reach out to cal,pt would like  Marjorie louder p\to reach back out to him in regards to missed call at 8;009 PT Callback# Is [phone]

## 2024-08-08 NOTE — Progress Notes (Signed)
 Surgical Instructions   Your procedure is scheduled on Monday, December 8th. Report to [facility] Main Entrance A at 7:30 A.M., then check in with the Admitting office. Any questions or running late day of surgery: call [phone]  Questions prior to your surgery date: call [phone], Monday-Friday, 8am-4pm. If you experience any cold or flu symptoms such as cough, fever, chills, shortness of breath, etc. between now and your scheduled surgery, please notify us  at the above number.     Remember:  Do not eat or drink after midnight the night before your surgery. This includes no gum, mints, or hard candy.    Take these medicines the morning of surgery with A SIP OF WATER:  buPROPion  (WELLBUTRIN  XL)  carvedilol  (COREG )    May take these medicines IF NEEDED: HYDROcodone -acetaminophen  (NORCO/VICODIN)    Per your cardiologist's instruction, HOLD your apixaban  (ELIQUIS ) for 3 day's prior to surgery.  Last dose: Thursday, Dec. 4th  One week prior to surgery, STOP taking any Aspirin  (unless otherwise instructed by your surgeon) Aleve , Naproxen , Ibuprofen , Motrin , Advil , Goody's, BC's, all herbal medications, fish oil, and non-prescription vitamins.                     Do NOT Smoke (Tobacco/Vaping) for 24 hours prior to your procedure.  If you use a CPAP at night, you may bring your mask/headgear for your overnight stay.   You will be asked to remove any contacts, glasses, piercing's, hearing aid's, dentures/partials prior to surgery. Please bring cases for these items if needed.    Patients discharged the day of surgery will not be allowed to drive home, and someone needs to stay with them for 24 hours.  SURGICAL WAITING ROOM VISITATION Patients may have no more than 2 support people in the waiting area - these visitors may rotate.   Pre-op nurse will coordinate an appropriate time for 1 ADULT support person, who may not rotate, to accompany patient in pre-op.  Children under  the age of 25 must have an adult with them who is not the patient and must remain in the main waiting area with an adult.  If the patient needs to stay at the hospital during part of their recovery, the visitor guidelines for inpatient rooms apply.  Please refer to the [facility] website for the visitor guidelines for any additional information.   If you received a COVID test during your pre-op visit  it is requested that you wear a mask when out in public, stay away from anyone that may not be feeling well and notify your surgeon if you develop symptoms. If you have been in contact with anyone that has tested positive in the last 10 days please notify you surgeon.      Pre-operative 4 CHG Bathing Instructions   You can play a key role in reducing the risk of infection after surgery. Your skin needs to be as free of germs as possible. You can reduce the number of germs on your skin by washing with CHG (chlorhexidine  gluconate) soap before surgery. CHG is an antiseptic soap that kills germs and continues to kill germs even after washing.   DO NOT use if you have an allergy to chlorhexidine /CHG or antibacterial soaps. If your skin becomes reddened or irritated, stop using the CHG and notify one of our RNs at [phone].   Please shower with the CHG soap starting 4 days before surgery using the following schedule:     Please keep  in mind the following:  DO NOT shave, including legs and underarms, starting the day of your first shower.   You may shave your face at any point before/day of surgery.  Place clean sheets on your bed the day you start using CHG soap. Use a clean washcloth (not used since being washed) for each shower. DO NOT sleep with pets once you start using the CHG.   CHG Shower Instructions:  Wash your face and private area with normal soap. If you choose to wash your hair, wash first with your normal shampoo.  After you use shampoo/soap, rinse your hair and body  thoroughly to remove shampoo/soap residue.  Turn the water OFF and apply  bottle of CHG soap to a CLEAN washcloth.  Apply CHG soap ONLY FROM YOUR NECK DOWN TO YOUR TOES (washing for 3-5 minutes)  DO NOT use CHG soap on face, private areas, open wounds, or sores.  Pay special attention to the area where your surgery is being performed.  If you are having back surgery, having someone wash your back for you may be helpful. Wait 2 minutes after CHG soap is applied, then you may rinse off the CHG soap.  Pat dry with a clean towel  Put on clean clothes/pajamas   If you choose to wear lotion, please use ONLY the CHG-compatible lotions that are listed below.  Additional instructions for the day of surgery:  If you choose, you may shower the morning of surgery with an antibacterial soap.  DO NOT APPLY any lotions, deodorants, cologne, or perfumes.   Do not bring valuables to the hospital. [facility] is not responsible for any belongings/valuables. Do not wear nail polish, gel polish, artificial nails, or any other type of covering on natural nails (fingers and toes) Do not wear jewelry or makeup Put on clean/comfortable clothes.  Please brush your teeth.  Ask your nurse before applying any prescription medications to the skin.     CHG Compatible Lotions   Aveeno Moisturizing lotion  Cetaphil Moisturizing Cream  Cetaphil Moisturizing Lotion  Clairol Herbal Essence Moisturizing Lotion, Dry Skin  Clairol Herbal Essence Moisturizing Lotion, Extra Dry Skin  Clairol Herbal Essence Moisturizing Lotion, Normal Skin  Curel Age Defying Therapeutic Moisturizing Lotion with Alpha Hydroxy  Curel Extreme Care Body Lotion  Curel Soothing Hands Moisturizing Hand Lotion  Curel Therapeutic Moisturizing Cream, Fragrance-Free  Curel Therapeutic Moisturizing Lotion, Fragrance-Free  Curel Therapeutic Moisturizing Lotion, Original Formula  Eucerin Daily Replenishing Lotion  Eucerin Dry Skin Therapy Plus  Alpha Hydroxy Crme  Eucerin Dry Skin Therapy Plus Alpha Hydroxy Lotion  Eucerin Original Crme  Eucerin Original Lotion  Eucerin Plus Crme Eucerin Plus Lotion  Eucerin TriLipid Replenishing Lotion  Keri Anti-Bacterial Hand Lotion  Keri Deep Conditioning Original Lotion Dry Skin Formula Softly Scented  Keri Deep Conditioning Original Lotion, Fragrance Free Sensitive Skin Formula  Keri Lotion Fast Absorbing Fragrance Free Sensitive Skin Formula  Keri Lotion Fast Absorbing Softly Scented Dry Skin Formula  Keri Original Lotion  Keri Skin Renewal Lotion Keri Silky Smooth Lotion  Keri Silky Smooth Sensitive Skin Lotion  Nivea Body Creamy Conditioning Oil  Nivea Body Extra Enriched Lotion  Nivea Body Original Lotion  Nivea Body Sheer Moisturizing Lotion Nivea Crme  Nivea Skin Firming Lotion  NutraDerm 30 Skin Lotion  NutraDerm Skin Lotion  NutraDerm Therapeutic Skin Cream  NutraDerm Therapeutic Skin Lotion  ProShield Protective Hand Cream  Provon moisturizing lotion  Please read over the following fact sheets that you were  given.

## 2024-08-08 NOTE — Telephone Encounter (Signed)
 Left message to return phone call.

## 2024-08-09 ENCOUNTER — Other Ambulatory Visit: Payer: Self-pay

## 2024-08-09 ENCOUNTER — Encounter (HOSPITAL_COMMUNITY)
Admission: RE | Admit: 2024-08-09 | Discharge: 2024-08-09 | Disposition: A | Discharge status: Home / Self Care | Source: Ambulatory Visit | Attending: Neurosurgery | Admitting: Neurosurgery

## 2024-08-09 ENCOUNTER — Encounter (HOSPITAL_COMMUNITY): Payer: Self-pay

## 2024-08-09 ENCOUNTER — Ambulatory Visit: Attending: Adult Health

## 2024-08-09 ENCOUNTER — Encounter

## 2024-08-09 VITALS — BP 150/93 | HR 73 | Temp 98.1°F | Resp 18 | Ht 65.0 in | Wt 235.2 lb

## 2024-08-09 DIAGNOSIS — G4733 Obstructive sleep apnea (adult) (pediatric): Secondary | ICD-10-CM | POA: Insufficient documentation

## 2024-08-09 DIAGNOSIS — Z8673 Personal history of transient ischemic attack (TIA), and cerebral infarction without residual deficits: Secondary | ICD-10-CM | POA: Diagnosis not present

## 2024-08-09 DIAGNOSIS — Z95818 Presence of other cardiac implants and grafts: Secondary | ICD-10-CM | POA: Insufficient documentation

## 2024-08-09 DIAGNOSIS — Z981 Arthrodesis status: Secondary | ICD-10-CM | POA: Insufficient documentation

## 2024-08-09 DIAGNOSIS — Z7901 Long term (current) use of anticoagulants: Secondary | ICD-10-CM | POA: Insufficient documentation

## 2024-08-09 DIAGNOSIS — Z01818 Encounter for other preprocedural examination: Secondary | ICD-10-CM

## 2024-08-09 DIAGNOSIS — E785 Hyperlipidemia, unspecified: Secondary | ICD-10-CM | POA: Diagnosis not present

## 2024-08-09 DIAGNOSIS — I5030 Unspecified diastolic (congestive) heart failure: Secondary | ICD-10-CM | POA: Insufficient documentation

## 2024-08-09 DIAGNOSIS — Z720 Tobacco use: Secondary | ICD-10-CM | POA: Diagnosis not present

## 2024-08-09 DIAGNOSIS — I4891 Unspecified atrial fibrillation: Secondary | ICD-10-CM | POA: Diagnosis not present

## 2024-08-09 DIAGNOSIS — Z01812 Encounter for preprocedural laboratory examination: Secondary | ICD-10-CM | POA: Insufficient documentation

## 2024-08-09 LAB — BASIC METABOLIC PANEL WITH GFR
Anion gap: 8 (ref 5–15)
BUN: 19 mg/dL (ref 8–23)
CO2: 28 mmol/L (ref 22–32)
Calcium: 8.7 mg/dL — ABNORMAL LOW (ref 8.9–10.3)
Chloride: 103 mmol/L (ref 98–111)
Creatinine, Ser: 1.13 mg/dL (ref 0.61–1.24)
GFR, Estimated: >60 mL/min (ref >60)
Glucose, Bld: 100 mg/dL — ABNORMAL HIGH (ref 70–99)
Potassium: 4.2 mmol/L (ref 3.5–5.1)
Sodium: 139 mmol/L (ref 135–145)

## 2024-08-09 LAB — CBC
HCT: 43.8 % (ref 39.0–52.0)
Hemoglobin: 14.3 g/dL (ref 13.0–17.0)
MCH: 29.7 pg (ref 26.0–34.0)
MCHC: 32.6 g/dL (ref 30.0–36.0)
MCV: 90.9 fL (ref 80.0–100.0)
Platelets: 339 K/uL (ref 150–400)
RBC: 4.82 MIL/uL (ref 4.22–5.81)
RDW: 13.5 % (ref 11.5–15.5)
WBC: 8.8 K/uL (ref 4.0–10.5)
nRBC: 0 % (ref 0.0–0.2)

## 2024-08-09 LAB — SURGICAL PCR SCREEN
MRSA, PCR: NEGATIVE
Staphylococcus aureus: NEGATIVE

## 2024-08-09 NOTE — Progress Notes (Signed)
 PCP - [name], NP Cardiologist - Dr. Lonni Nanas, LOV 08/01/2024 by NP  PPM/ICD - Has loop recorder Device Orders - na Rep Notified - na  Chest x-ray - na EKG - 03/18/2024 Stress Test -  ECHO - 02/07/2023 Cardiac Cath -   Sleep Study - had sleep study, denies sleep apnea CPAP - na  Non-diabetic  Blood Thinner Instructions: Eliquis , hold 3 days Aspirin  Instructions:denies  ERAS Protcol - NPO  Anesthesia review: Yes. A-Fib, stroke  Patient denies shortness of breath, fever, cough and chest pain at PAT appointment   All instructions explained to the patient, with a verbal understanding of the material. Patient agrees to go over the instructions while at home for a better understanding. Patient also instructed to self quarantine after being tested for COVID-19. The opportunity to ask questions was provided.

## 2024-08-10 NOTE — Anesthesia Preprocedure Evaluation (Addendum)
 Anesthesia Evaluation  Patient identified by MRN, date of birth, ID band Patient awake    Reviewed: Allergy & Precautions, NPO status , Patient's Chart, lab work & pertinent test results  History of Anesthesia Complications Negative for: history of anesthetic complications  Airway Mallampati: III  TM Distance: <3 FB Neck ROM: Full    Dental no notable dental hx. (+) Teeth Intact   Pulmonary asthma , neg sleep apnea, neg COPD, Current Smoker and Patient abstained from smoking.   Pulmonary exam normal breath sounds clear to auscultation       Cardiovascular Exercise Tolerance: Good METShypertension, Pt. on home beta blockers +CHF  (-) CAD and (-) Past MI Normal cardiovascular exam+ dysrhythmias Atrial Fibrillation  Rhythm:Regular Rate:Normal - Systolic murmurs    Neuro/Psych  PSYCHIATRIC DISORDERS Anxiety Depression     Neuromuscular disease CVA, No Residual Symptoms    GI/Hepatic negative GI ROS, Neg liver ROS,neg GERD  ,,  Endo/Other  negative endocrine ROSneg diabetes    Renal/GU negative Renal ROS     Musculoskeletal  (+) Arthritis ,    Abdominal  (+) + obese  Peds  Hematology  (+) Blood dyscrasia (Eliquis ) PLT: 300   Anesthesia Other Findings PAT note by Lynwood Hope, PA-C:  68 year old male current smoker follows with cardiology for history of atrial fibrillation on Eliquis , CVA x 2, HFpEF, HLD, recent diagnosis of OSA on 02/28/2024 with recommendation for CPAP therapy (pt denies this), loop recorder in place. Echocardiogram 02/07/2023 showed EF 60 to 65%, normal RV function, no significant valvular disease.  Seen by Delon Hoover, NP on 08/01/24 for proep eval. Per note, Mr. Berkel's perioperative risk of a major cardiac event is 0.9% according to the Revised Cardiac Risk Index (RCRI).  Therefore, he is at low risk for perioperative complications.   His functional capacity is good at 5.07 METs according to the [facility]  Activity Status Index (DASI). Recommendations: According to ACC/AHA guidelines, no further cardiovascular testing needed.  The patient may proceed to surgery at acceptable risk. Antiplatelet and/or Anticoagulation Recommendations: Eliquis  (Apixaban ) can be held for 3 days prior to surgery.  Please resume post op when felt to be safe.   History of C6-7 ACDF.  Glidescope used electively for intubation 07/27/2023 (T11-12 decompression for acute thoracic myelopathy).   Preop labs reviewed, unremarkable.   EKG 03/18/2024: Normal sinus rhythm.  Rate 79. Left axis deviation. Low voltage QRS. Cannot rule out Anterior infarct , age undetermined   TTE 02/07/2023: 1. Left ventricular ejection fraction, by estimation, is 60 to 65%. The  left ventricle has normal function. The left ventricle has no regional  wall motion abnormalities. There is mild concentric left ventricular  hypertrophy. Left ventricular diastolic  parameters were normal.   2. Right ventricular systolic function is normal. The right ventricular  size is normal.   3. The mitral valve is normal in structure. Trivial mitral valve  regurgitation. No evidence of mitral stenosis.   4. The aortic valve is tricuspid. There is mild calcification of the  aortic valve. Aortic valve regurgitation is not visualized. No aortic  stenosis is present.   5. The inferior vena cava is normal in size with greater than 50%  respiratory variability, suggesting right atrial pressure of 3 mmHg.      Reproductive/Obstetrics                              Anesthesia Physical Anesthesia Plan  ASA: 3  Anesthesia Plan: General   Post-op Pain Management: Ofirmev  IV (intra-op)*, Ketamine  IV* and Dilaudid  IV   Induction: Intravenous  PONV Risk Score and Plan: 2 and Ondansetron , Dexamethasone  and Midazolam   Airway Management Planned: Oral ETT and Video Laryngoscope Planned  Additional Equipment: None  Intra-op Plan:    Post-operative Plan: Extubation in OR  Informed Consent: I have reviewed the patients History and Physical, chart, labs and discussed the procedure including the risks, benefits and alternatives for the proposed anesthesia with the patient or authorized representative who has indicated his/her understanding and acceptance.     Dental advisory given  Plan Discussed with: CRNA and Surgeon  Anesthesia Plan Comments: (Discussed risks of anesthesia with patient, including PONV, sore throat, lip/dental/eye damage. Rare risks discussed as well, such as cardiorespiratory and neurological sequelae, and allergic reactions. Discussed the role of CRNA in patient's perioperative care. Patient understands.)         Anesthesia Quick Evaluation

## 2024-08-10 NOTE — Progress Notes (Signed)
 Anesthesia Chart Review:  68 year old male current smoker follows with cardiology for history of atrial fibrillation on Eliquis , CVA x 2, HFpEF, HLD, recent diagnosis of OSA on 02/28/2024 with recommendation for CPAP therapy (pt denies this), loop recorder in place. Echocardiogram 02/07/2023 showed EF 60 to 65%, normal RV function, no significant valvular disease.  Seen by Delon Hoover, NP on 08/01/24 for proep eval. Per note, Mr. Dehnert's perioperative risk of a major cardiac event is 0.9% according to the Revised Cardiac Risk Index (RCRI).  Therefore, he is at low risk for perioperative complications.   His functional capacity is good at 5.07 METs according to the [facility] Activity Status Index (DASI). Recommendations: According to ACC/AHA guidelines, no further cardiovascular testing needed.  The patient may proceed to surgery at acceptable risk. Antiplatelet and/or Anticoagulation Recommendations: Eliquis  (Apixaban ) can be held for 3 days prior to surgery.  Please resume post op when felt to be safe.   History of C6-7 ACDF.  Glidescope used electively for intubation 07/27/2023 (T11-12 decompression for acute thoracic myelopathy).   Preop labs reviewed, unremarkable.   EKG 03/18/2024: Normal sinus rhythm.  Rate 79. Left axis deviation. Low voltage QRS. Cannot rule out Anterior infarct , age undetermined   TTE 02/07/2023: 1. Left ventricular ejection fraction, by estimation, is 60 to 65%. The  left ventricle has normal function. The left ventricle has no regional  wall motion abnormalities. There is mild concentric left ventricular  hypertrophy. Left ventricular diastolic  parameters were normal.   2. Right ventricular systolic function is normal. The right ventricular  size is normal.   3. The mitral valve is normal in structure. Trivial mitral valve  regurgitation. No evidence of mitral stenosis.   4. The aortic valve is tricuspid. There is mild calcification of the  aortic valve. Aortic valve  regurgitation is not visualized. No aortic  stenosis is present.   5. The inferior vena cava is normal in size with greater than 50%  respiratory variability, suggesting right atrial pressure of 3 mmHg.     [name] [facility] Stay [facility]/Anesthesiology Phone [phone] 08/10/2024 11:24 AM

## 2024-08-13 ENCOUNTER — Ambulatory Visit (HOSPITAL_COMMUNITY): Admitting: Anesthesiology

## 2024-08-13 ENCOUNTER — Ambulatory Visit (HOSPITAL_COMMUNITY)
Admission: RE | Admit: 2024-08-13 | Discharge: 2024-08-14 | Disposition: A | Attending: Neurosurgery | Admitting: Neurosurgery

## 2024-08-13 ENCOUNTER — Ambulatory Visit (HOSPITAL_COMMUNITY): Payer: Self-pay | Admitting: Vascular Surgery

## 2024-08-13 ENCOUNTER — Ambulatory Visit (HOSPITAL_COMMUNITY)

## 2024-08-13 ENCOUNTER — Other Ambulatory Visit: Payer: Self-pay

## 2024-08-13 ENCOUNTER — Ambulatory Visit (HOSPITAL_COMMUNITY): Admission: RE | Disposition: A | Payer: Self-pay | Source: Home / Self Care | Attending: Neurosurgery

## 2024-08-13 ENCOUNTER — Encounter (HOSPITAL_COMMUNITY): Payer: Self-pay | Admitting: Neurosurgery

## 2024-08-13 DIAGNOSIS — M48061 Spinal stenosis, lumbar region without neurogenic claudication: Secondary | ICD-10-CM | POA: Diagnosis present

## 2024-08-13 DIAGNOSIS — M5416 Radiculopathy, lumbar region: Secondary | ICD-10-CM | POA: Diagnosis not present

## 2024-08-13 DIAGNOSIS — M48062 Spinal stenosis, lumbar region with neurogenic claudication: Secondary | ICD-10-CM | POA: Diagnosis not present

## 2024-08-13 HISTORY — PX: LUMBAR LAMINECTOMY/DECOMPRESSION MICRODISCECTOMY: SHX5026

## 2024-08-13 SURGERY — LUMBAR LAMINECTOMY/DECOMPRESSION MICRODISCECTOMY 3 LEVELS
Anesthesia: General | Site: Back

## 2024-08-13 MED ORDER — PANTOPRAZOLE SODIUM 40 MG IV SOLR: 40.0000 mg | Freq: Every day | INTRAVENOUS | Status: DC

## 2024-08-13 MED ORDER — BUPROPION HCL ER (XL) 150 MG PO TB24
150.0000 mg | ORAL_TABLET | Freq: Every day | ORAL | Status: DC
  Administered 2024-08-14: 150 mg via ORAL
  Filled 2024-08-13: qty 1

## 2024-08-13 MED ORDER — PHENYLEPHRINE HCL-NACL 20-0.9 MG/250ML-% IV SOLN
INTRAVENOUS | Status: AC
  Filled 2024-08-13: qty 250

## 2024-08-13 MED ORDER — OXYCODONE HCL 5 MG PO TABS: 5.0000 mg | ORAL_TABLET | ORAL | Status: DC | PRN

## 2024-08-13 MED ORDER — FENTANYL CITRATE (PF) 100 MCG/2ML IJ SOLN
25.0000 ug | INTRAMUSCULAR | Status: DC | PRN
  Administered 2024-08-13 (×3): 50 ug via INTRAVENOUS

## 2024-08-13 MED ORDER — BUPIVACAINE HCL (PF) 0.25 % IJ SOLN
INTRAMUSCULAR | Status: AC
  Filled 2024-08-13: qty 30

## 2024-08-13 MED ORDER — 0.9 % SODIUM CHLORIDE (POUR BTL) OPTIME
TOPICAL | Status: DC | PRN
  Administered 2024-08-13: 1000 mL

## 2024-08-13 MED ORDER — CEFAZOLIN SODIUM-DEXTROSE 2-4 GM/100ML-% IV SOLN
INTRAVENOUS | Status: AC
  Filled 2024-08-13: qty 100

## 2024-08-13 MED ORDER — HYDROMORPHONE HCL 1 MG/ML IJ SOLN
INTRAMUSCULAR | Status: AC
  Filled 2024-08-13: qty 0.5

## 2024-08-13 MED ORDER — LIDOCAINE 2% (20 MG/ML) 5 ML SYRINGE
INTRAMUSCULAR | Status: AC
  Filled 2024-08-13: qty 5

## 2024-08-13 MED ORDER — LIDOCAINE-EPINEPHRINE 1 %-1:100000 IJ SOLN
INTRAMUSCULAR | Status: AC
  Filled 2024-08-13: qty 1

## 2024-08-13 MED ORDER — HYDROMORPHONE HCL 1 MG/ML IJ SOLN
0.5000 mg | INTRAMUSCULAR | Status: DC | PRN
  Administered 2024-08-13: 0.5 mg via INTRAVENOUS
  Filled 2024-08-13: qty 0.5

## 2024-08-13 MED ORDER — ONDANSETRON HCL 4 MG/2ML IJ SOLN: 4.0000 mg | Freq: Four times a day (QID) | INTRAMUSCULAR | Status: DC | PRN

## 2024-08-13 MED ORDER — LIDOCAINE 2% (20 MG/ML) 5 ML SYRINGE
INTRAMUSCULAR | Status: DC | PRN
  Administered 2024-08-13: 100 mg via INTRAVENOUS

## 2024-08-13 MED ORDER — ALUM & MAG HYDROXIDE-SIMETH 200-200-20 MG/5ML PO SUSP: 30.0000 mL | Freq: Four times a day (QID) | ORAL | Status: DC | PRN

## 2024-08-13 MED ORDER — PHENOL 1.4 % MT LIQD: 1.0000 | OROMUCOSAL | Status: DC | PRN

## 2024-08-13 MED ORDER — THROMBIN 5000 UNITS EX KIT
PACK | CUTANEOUS | Status: AC
  Filled 2024-08-13: qty 2

## 2024-08-13 MED ORDER — OXYCODONE HCL 5 MG/5ML PO SOLN: 5.0000 mg | Freq: Once | ORAL | Status: AC | PRN

## 2024-08-13 MED ORDER — ACETAMINOPHEN 10 MG/ML IV SOLN
INTRAVENOUS | Status: AC
  Filled 2024-08-13: qty 100

## 2024-08-13 MED ORDER — DROPERIDOL 2.5 MG/ML IJ SOLN: 0.6250 mg | Freq: Once | INTRAMUSCULAR | Status: DC | PRN

## 2024-08-13 MED ORDER — SODIUM CHLORIDE 0.9% FLUSH
3.0000 mL | Freq: Two times a day (BID) | INTRAVENOUS | Status: DC
  Administered 2024-08-13 (×2): 3 mL via INTRAVENOUS

## 2024-08-13 MED ORDER — CHLORHEXIDINE GLUCONATE CLOTH 2 % EX PADS: 6.0000 | MEDICATED_PAD | Freq: Once | CUTANEOUS | Status: DC

## 2024-08-13 MED ORDER — SODIUM CHLORIDE 0.9 % IV SOLN
250.0000 mL | INTRAVENOUS | Status: AC
  Administered 2024-08-13: 250 mL via INTRAVENOUS

## 2024-08-13 MED ORDER — LACTATED RINGERS IV SOLN: INTRAVENOUS | Status: DC

## 2024-08-13 MED ORDER — FENTANYL CITRATE (PF) 100 MCG/2ML IJ SOLN
INTRAMUSCULAR | Status: AC
  Filled 2024-08-13: qty 2

## 2024-08-13 MED ORDER — FENTANYL CITRATE (PF) 250 MCG/5ML IJ SOLN
INTRAMUSCULAR | Status: DC | PRN
  Administered 2024-08-13: 100 ug via INTRAVENOUS

## 2024-08-13 MED ORDER — ACETAMINOPHEN 325 MG PO TABS: 650.0000 mg | ORAL_TABLET | ORAL | Status: DC | PRN

## 2024-08-13 MED ORDER — FENTANYL CITRATE (PF) 250 MCG/5ML IJ SOLN
INTRAMUSCULAR | Status: AC
  Filled 2024-08-13: qty 5

## 2024-08-13 MED ORDER — PROPOFOL 10 MG/ML IV BOLUS
INTRAVENOUS | Status: DC | PRN
  Administered 2024-08-13: 150 mg via INTRAVENOUS

## 2024-08-13 MED ORDER — SODIUM CHLORIDE 0.9% FLUSH: 3.0000 mL | INTRAVENOUS | Status: DC | PRN

## 2024-08-13 MED ORDER — ROCURONIUM BROMIDE 10 MG/ML (PF) SYRINGE
PREFILLED_SYRINGE | INTRAVENOUS | Status: DC | PRN
  Administered 2024-08-13: 20 mg via INTRAVENOUS
  Administered 2024-08-13: 40 mg via INTRAVENOUS
  Administered 2024-08-13: 60 mg via INTRAVENOUS

## 2024-08-13 MED ORDER — MIDAZOLAM HCL 2 MG/2ML IJ SOLN
INTRAMUSCULAR | Status: AC
  Filled 2024-08-13: qty 2

## 2024-08-13 MED ORDER — OXYCODONE HCL 5 MG PO TABS
ORAL_TABLET | ORAL | Status: AC
  Filled 2024-08-13: qty 1

## 2024-08-13 MED ORDER — BUPIVACAINE HCL (PF) 0.25 % IJ SOLN
INTRAMUSCULAR | Status: DC | PRN
  Administered 2024-08-13: 10 mL

## 2024-08-13 MED ORDER — PHENYLEPHRINE 80 MCG/ML (10ML) SYRINGE FOR IV PUSH (FOR BLOOD PRESSURE SUPPORT)
PREFILLED_SYRINGE | INTRAVENOUS | Status: DC | PRN
  Administered 2024-08-13 (×2): 80 ug via INTRAVENOUS

## 2024-08-13 MED ORDER — PANTOPRAZOLE SODIUM 40 MG PO TBEC
40.0000 mg | DELAYED_RELEASE_TABLET | Freq: Every day | ORAL | Status: DC
  Administered 2024-08-13: 40 mg via ORAL
  Filled 2024-08-13: qty 1

## 2024-08-13 MED ORDER — ACETAMINOPHEN 650 MG RE SUPP: 650.0000 mg | RECTAL | Status: DC | PRN

## 2024-08-13 MED ORDER — PHENYLEPHRINE HCL-NACL 20-0.9 MG/250ML-% IV SOLN
INTRAVENOUS | Status: DC | PRN
  Administered 2024-08-13: 40 ug/min via INTRAVENOUS

## 2024-08-13 MED ORDER — HYDROCODONE-ACETAMINOPHEN 5-325 MG PO TABS
2.0000 | ORAL_TABLET | ORAL | Status: DC | PRN
  Administered 2024-08-13 – 2024-08-14 (×4): 2 via ORAL
  Filled 2024-08-13 (×4): qty 2

## 2024-08-13 MED ORDER — KETAMINE HCL 10 MG/ML IJ SOLN
INTRAMUSCULAR | Status: DC | PRN
  Administered 2024-08-13: 10 mg via INTRAVENOUS
  Administered 2024-08-13: 40 mg via INTRAVENOUS

## 2024-08-13 MED ORDER — GLYCOPYRROLATE 0.2 MG/ML IJ SOLN
INTRAMUSCULAR | Status: DC | PRN
  Administered 2024-08-13: .2 mg via INTRAVENOUS

## 2024-08-13 MED ORDER — ONDANSETRON HCL 4 MG PO TABS: 4.0000 mg | ORAL_TABLET | Freq: Four times a day (QID) | ORAL | Status: DC | PRN

## 2024-08-13 MED ORDER — NICOTINE 14 MG/24HR TD PT24
14.0000 mg | MEDICATED_PATCH | Freq: Every day | TRANSDERMAL | Status: DC
  Filled 2024-08-13 (×2): qty 1

## 2024-08-13 MED ORDER — CEFAZOLIN SODIUM-DEXTROSE 2-4 GM/100ML-% IV SOLN
2.0000 g | INTRAVENOUS | Status: AC
  Administered 2024-08-13: 2 g via INTRAVENOUS

## 2024-08-13 MED ORDER — OXYCODONE HCL 5 MG PO TABS
5.0000 mg | ORAL_TABLET | Freq: Once | ORAL | Status: AC | PRN
  Administered 2024-08-13: 5 mg via ORAL

## 2024-08-13 MED ORDER — LIDOCAINE-EPINEPHRINE 1 %-1:100000 IJ SOLN
INTRAMUSCULAR | Status: DC | PRN
  Administered 2024-08-13: 10 mL

## 2024-08-13 MED ORDER — ROCURONIUM BROMIDE 10 MG/ML (PF) SYRINGE
PREFILLED_SYRINGE | INTRAVENOUS | Status: AC
  Filled 2024-08-13: qty 10

## 2024-08-13 MED ORDER — CHLORHEXIDINE GLUCONATE CLOTH 2 % EX PADS
6.0000 | MEDICATED_PAD | Freq: Once | CUTANEOUS | Status: AC
  Administered 2024-08-13: 6 via TOPICAL

## 2024-08-13 MED ORDER — DEXAMETHASONE SOD PHOSPHATE PF 10 MG/ML IJ SOLN
INTRAMUSCULAR | Status: DC | PRN
  Administered 2024-08-13: 10 mg via INTRAVENOUS

## 2024-08-13 MED ORDER — SUGAMMADEX SODIUM 200 MG/2ML IV SOLN
INTRAVENOUS | Status: DC | PRN
  Administered 2024-08-13: 200 mg via INTRAVENOUS

## 2024-08-13 MED ORDER — MIDAZOLAM HCL (PF) 2 MG/2ML IJ SOLN
INTRAMUSCULAR | Status: DC | PRN
  Administered 2024-08-13: 2 mg via INTRAVENOUS

## 2024-08-13 MED ORDER — ACETAMINOPHEN 500 MG PO TABS: 500.0000 mg | ORAL_TABLET | Freq: Four times a day (QID) | ORAL | Status: DC | PRN

## 2024-08-13 MED ORDER — KETAMINE HCL 50 MG/5ML IJ SOSY
PREFILLED_SYRINGE | INTRAMUSCULAR | Status: AC
  Filled 2024-08-13: qty 5

## 2024-08-13 MED ORDER — CEFAZOLIN SODIUM-DEXTROSE 2-4 GM/100ML-% IV SOLN
2.0000 g | Freq: Three times a day (TID) | INTRAVENOUS | Status: AC
  Administered 2024-08-13 – 2024-08-14 (×2): 2 g via INTRAVENOUS
  Filled 2024-08-13 (×2): qty 100

## 2024-08-13 MED ORDER — MENTHOL 3 MG MT LOZG: 1.0000 | LOZENGE | OROMUCOSAL | Status: DC | PRN

## 2024-08-13 MED ORDER — EPHEDRINE SULFATE (PRESSORS) 25 MG/5ML IV SOSY
PREFILLED_SYRINGE | INTRAVENOUS | Status: DC | PRN
  Administered 2024-08-13: 10 mg via INTRAVENOUS

## 2024-08-13 MED ORDER — ONDANSETRON HCL 4 MG/2ML IJ SOLN
INTRAMUSCULAR | Status: DC | PRN
  Administered 2024-08-13: 4 mg via INTRAVENOUS

## 2024-08-13 MED ORDER — CYCLOBENZAPRINE HCL 10 MG PO TABS
10.0000 mg | ORAL_TABLET | Freq: Three times a day (TID) | ORAL | Status: DC | PRN
  Administered 2024-08-13 – 2024-08-14 (×2): 10 mg via ORAL
  Filled 2024-08-13 (×2): qty 1

## 2024-08-13 MED ORDER — CHLORHEXIDINE GLUCONATE 0.12 % MT SOLN
15.0000 mL | Freq: Once | OROMUCOSAL | Status: AC
  Administered 2024-08-13: 15 mL via OROMUCOSAL

## 2024-08-13 MED ORDER — SENNOSIDES-DOCUSATE SODIUM 8.6-50 MG PO TABS
1.0000 | ORAL_TABLET | Freq: Two times a day (BID) | ORAL | Status: DC | PRN
  Administered 2024-08-13: 1 via ORAL
  Filled 2024-08-13: qty 1

## 2024-08-13 MED ORDER — PROPOFOL 10 MG/ML IV BOLUS
INTRAVENOUS | Status: AC
  Filled 2024-08-13: qty 20

## 2024-08-13 MED ORDER — CARVEDILOL 6.25 MG PO TABS
6.2500 mg | ORAL_TABLET | Freq: Two times a day (BID) | ORAL | Status: DC
  Administered 2024-08-13 – 2024-08-14 (×2): 6.25 mg via ORAL
  Filled 2024-08-13 (×2): qty 1

## 2024-08-13 MED ORDER — ACETAMINOPHEN 10 MG/ML IV SOLN
INTRAVENOUS | Status: DC | PRN
  Administered 2024-08-13: 1000 mg via INTRAVENOUS

## 2024-08-13 MED ORDER — ACETAMINOPHEN 10 MG/ML IV SOLN: 1000.0000 mg | Freq: Once | INTRAVENOUS | Status: DC | PRN

## 2024-08-13 MED ORDER — EVOLOCUMAB 140 MG/ML ~~LOC~~ SOAJ: 1.0000 mL | SUBCUTANEOUS | Status: DC

## 2024-08-13 MED ORDER — HYDROCODONE-ACETAMINOPHEN 5-325 MG PO TABS
2.0000 | ORAL_TABLET | ORAL | Status: DC | PRN
  Administered 2024-08-13: 2 via ORAL
  Filled 2024-08-13: qty 2

## 2024-08-13 MED ORDER — ORAL CARE MOUTH RINSE: 15.0000 mL | Freq: Once | OROMUCOSAL | Status: AC

## 2024-08-13 MED ORDER — BUPIVACAINE HCL (PF) 0.5 % IJ SOLN
INTRAMUSCULAR | Status: AC
  Filled 2024-08-13: qty 30

## 2024-08-13 MED ORDER — THROMBIN (RECOMBINANT) 5000 UNITS EX SOLR
CUTANEOUS | Status: DC | PRN
  Administered 2024-08-13: 10 mL via TOPICAL

## 2024-08-13 SURGICAL SUPPLY — 42 items
BAG COUNTER SPONGE SURGICOUNT (BAG) ×1 IMPLANT
BAND RUBBER 3X1/6 STRL (MISCELLANEOUS) ×4 IMPLANT
BENZOIN TINCTURE PRP APPL 2/3 (GAUZE/BANDAGES/DRESSINGS) ×1 IMPLANT
BLADE CLIPPER SURG (BLADE) IMPLANT
BLADE SURG 11 STRL SS (BLADE) ×2 IMPLANT
BUR CUTTER 7.0 ROUND (BURR) ×2 IMPLANT
BUR MATCHSTICK NEURO 3.0 LAGG (BURR) ×2 IMPLANT
CANISTER SUCTION 3000ML PPV (SUCTIONS) ×1 IMPLANT
DERMABOND ADVANCED .7 DNX12 (GAUZE/BANDAGES/DRESSINGS) ×1 IMPLANT
DRAPE HALF SHEET 40X57 (DRAPES) IMPLANT
DRAPE LAPAROTOMY 100X72X124 (DRAPES) ×1 IMPLANT
DRAPE MICROSCOPE LEICA (MISCELLANEOUS) ×1 IMPLANT
DRAPE SURG 17X23 STRL (DRAPES) ×2 IMPLANT
DRSG OPSITE POSTOP 4X6 (GAUZE/BANDAGES/DRESSINGS) IMPLANT
DURAPREP 26ML APPLICATOR (WOUND CARE) ×2 IMPLANT
ELECTRODE BLDE 4.0 EZ CLN MEGD (MISCELLANEOUS) IMPLANT
ELECTRODE REM PT RTRN 9FT ADLT (ELECTROSURGICAL) ×2 IMPLANT
GAUZE 4X4 16PLY ~~LOC~~+RFID DBL (SPONGE) IMPLANT
GAUZE SPONGE 4X4 12PLY STRL (GAUZE/BANDAGES/DRESSINGS) ×1 IMPLANT
GLOVE BIO SURGEON STRL SZ7 (GLOVE) IMPLANT
GLOVE BIO SURGEON STRL SZ8 (GLOVE) ×1 IMPLANT
GLOVE BIOGEL PI IND STRL 7.0 (GLOVE) IMPLANT
GLOVE BIOGEL PI MICRO STRL 7.5 (GLOVE) IMPLANT
GLOVE INDICATOR 8.5 STRL (GLOVE) ×2 IMPLANT
GOWN STRL REUS W/ TWL LRG LVL3 (GOWN DISPOSABLE) ×1 IMPLANT
GOWN STRL REUS W/ TWL XL LVL3 (GOWN DISPOSABLE) ×2 IMPLANT
GOWN STRL REUS W/TWL 2XL LVL3 (GOWN DISPOSABLE) IMPLANT
KIT BASIN OR (CUSTOM PROCEDURE TRAY) ×1 IMPLANT
KIT TURNOVER KIT B (KITS) ×1 IMPLANT
NDL HYPO 22X1.5 SAFETY MO (MISCELLANEOUS) ×1 IMPLANT
NDL SPNL 22GX3.5 QUINCKE BK (NEEDLE) ×2 IMPLANT
PACK LAMINECTOMY NEURO (CUSTOM PROCEDURE TRAY) ×1 IMPLANT
SOLN 0.9% NACL POUR BTL 1000ML (IV SOLUTION) ×1 IMPLANT
SOLN STERILE WATER BTL 1000 ML (IV SOLUTION) ×2 IMPLANT
SPIKE FLUID TRANSFER (MISCELLANEOUS) ×1 IMPLANT
SPONGE SURGIFOAM ABS GEL SZ50 (HEMOSTASIS) ×1 IMPLANT
STRIP CLOSURE SKIN 1/2X4 (GAUZE/BANDAGES/DRESSINGS) ×1 IMPLANT
SUT VIC AB 0 CT1 18XCR BRD8 (SUTURE) ×2 IMPLANT
SUT VIC AB 2-0 CT1 18 (SUTURE) ×1 IMPLANT
SUT VIC AB 4-0 PS2 27 (SUTURE) ×2 IMPLANT
TOWEL GREEN STERILE (TOWEL DISPOSABLE) ×2 IMPLANT
TOWEL GREEN STERILE FF (TOWEL DISPOSABLE) ×2 IMPLANT

## 2024-08-13 NOTE — Op Note (Signed)
 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  Postoperative diagnosis: Same.  Procedure: Decompressive lumbar laminectomy L2-3 L3-4 L4-5 with foraminotomies of the L2, L3, L4, L5 nerve roots and partial facetectomies at L2-3, L3-4, and L4-5.  Surgeon: Arley helling.  Assistant: Suzen Click.  Anesthesia: General.  EBL: Minimal.  HPI: 68 years open progressive worsening back bilateral hip and leg pain workup revealed severe spinal stenosis and progressive over the last year with serial MRI scans failed all forms of conservative treatments were recommended decompressive laminectomies partial facetectomies and foraminotomies at those levels.  We extensively reviewed the risks and benefits of that operation with him as well as perioperative course expectations of outcome and alternatives to surgery and he understood and agreed to proceed forward.  Operative procedure: Patient was brought into the OR was induced under general anesthesia positioned prone the Wilson frame his back was prepped and draped in routine sterile fashion.  After infiltration of 10 cc lidocaine  with epi midline incision was made and Bovie that cautery was used to take down the subcutaneous tissue and subperiosteal dissection was carried out on the lamina of L2, L3, L4 and L5.  Interoperative x-ray confirmed identification appropriate level so the spinous processes at L2-3-4 and superior aspect the spinous process at L5 was removed central lamina was drilled down and thinned out decompressive laminectomy was begun centrally at L4-5 and then skipped up to L3-4 performed complete laminectomy of LP and L4 partial of the inferior aspect of L2 and superior aspect of L5.  Margin down the gutter large medially projecting spurs coming off the 2334 and 4 5 facet were all dissected off of the dura and under Bitton decompressing the central canal foraminotomies were performed at L2, L3,  L4, and L5 bilaterally.  At the end of decompression there was no further stenosis either centrally or foraminally at those levels.  Postop imaging confirmed adequate superior and inferior margins of the decompression.  Gelfoam is away and top of the dura a medium Hemovac drain was placed meticulous hemostasis was maintained the wound was copiously irrigated and closed in layers with interrupted Vicryl and the fascia injected Marcaine  and then interrupted Vicryl in the subcutaneous tissue and a running 4-0 subcuticular.  Dermabond benzoin Steri-Strips and a sterile dressing was applied patient to cover him in stable condition.  At the end the case all needle count sponge counts were correct.

## 2024-08-13 NOTE — Anesthesia Postprocedure Evaluation (Signed)
 Anesthesia Post Note  Patient: Cristian Gonzales  Procedure(s) Performed: LUMBAR LAMINECTOMY/DECOMPRESSION MICRODISCECTOMY 3 LEVELS (Back)     Patient location during evaluation: PACU Anesthesia Type: General Level of consciousness: awake and alert Pain management: pain level controlled Vital Signs Assessment: post-procedure vital signs reviewed and stable Respiratory status: spontaneous breathing, nonlabored ventilation, respiratory function stable and patient connected to nasal cannula oxygen Cardiovascular status: blood pressure returned to baseline and stable Postop Assessment: no apparent nausea or vomiting Anesthetic complications: no   No notable events documented.  Last Vitals:  Vitals:   08/13/24 1231 08/13/24 1245  BP: 130/83 128/75  Pulse: 66 64  Resp: 13 14  Temp:    SpO2: 97% 100%    Last Pain:  Vitals:   08/13/24 1253  TempSrc:   PainSc: 7                  Rome Ade

## 2024-08-13 NOTE — H&P (Signed)
 Cristian Gonzales is an 68 y.o. male.   Chief Complaint: Back bilateral hip and leg pain neurogenic claudication HPI: 67 year old gentleman with his severe back and bilateral hip and leg pain and neurogenic claudication.  Workup revealed severe spinal stenosis L2-3 L3-4 L4-5.  Due to the patient's progression of clinical syndrome imaging findings and failed conservative treatment I recommended decompressive laminectomy bilaterally with partial facetectomies and foraminotomies at the above levels.  I extensively reviewed the risks and benefits of the operation with the patient as well as perioperative course expectations of outcome and alternatives to surgery and he understood and agreed to proceed forward.  Past Medical History:  Diagnosis Date   Anxiety    Arthritis    Cervical spine fracture (HCC)    13 -diving accident, no neurolgic sequelae   Depression    Paroxysmal atrial fibrillation (HCC)    Pneumonia    x several   Stroke (HCC)    Syncope 11/27/2012   From Tramadol     Past Surgical History:  Procedure Laterality Date   BUBBLE STUDY  03/19/2021   Procedure: BUBBLE STUDY;  Surgeon: Delford Maude BROCKS, MD;  Location: [facility] ENDOSCOPY;  Service: Cardiovascular;;   CERVICAL DISCECTOMY  09/06/2002   dr gaither   EYE SURGERY Bilateral    cataracts removed ([facility])   KNEE SURGERY  09/07/1979   right   LOOP RECORDER INSERTION N/A 03/19/2021   Procedure: LOOP RECORDER INSERTION;  Surgeon: Cindie Ole DASEN, MD;  Location: [facility] INVASIVE CV LAB;  Service: Cardiovascular;  Laterality: N/A;   LUMBAR LAMINECTOMY/DECOMPRESSION MICRODISCECTOMY N/A 07/27/2023   Procedure: Thoracic Eleven-Twelve Decompression;  Surgeon: Onetha Kuba, MD;  Location: [facility] OR;  Service: Neurosurgery;  Laterality: N/A;   NECK SURGERY     REPLACEMENT TOTAL KNEE     right   REPLACEMENT TOTAL KNEE Left    TEE WITHOUT CARDIOVERSION N/A 03/19/2021   Procedure: TRANSESOPHAGEAL ECHOCARDIOGRAM (TEE);  Surgeon: Delford Maude BROCKS, MD;  Location: [facility] ENDOSCOPY;  Service: Cardiovascular;  Laterality: N/A;   TOTAL KNEE ARTHROPLASTY Left 05/14/2024   Procedure: ARTHROPLASTY, KNEE, TOTAL;  Surgeon: Jerri Kay HERO, MD;  Location: [facility] OR;  Service: Orthopedics;  Laterality: Left;    Family History  Problem Relation Age of Onset   Heart attack Father 40       Sudden Cardiac Death   Liver cancer Mother 32   Social History:  reports that he has been smoking cigars. He has never used smokeless tobacco. He reports current alcohol use. He reports that he does not currently use drugs.  Allergies:  Allergies  Allergen Reactions   Tramadol  Other (See Comments)    Passed out   Metoprolol  Other (See Comments)     Made my head feel like it was going to blow up    Morphine Other (See Comments)    Left red bumps on the back and did not work   Trazodone  And Nefazodone Other (See Comments)    Made the patient feel flushed,with blurred vision and muscle cramps     Medications Prior to Admission  Medication Sig Dispense Refill   acetaminophen  (TYLENOL ) 500 MG tablet Take 500-1,000 mg by mouth every 6 (six) hours as needed for moderate pain (pain score 4-6).     apixaban  (ELIQUIS ) 5 MG TABS tablet TAKE 1 TABLET BY MOUTH TWICE A DAY 180 tablet 1   buPROPion  (WELLBUTRIN  XL) 150 MG 24 hr tablet TAKE 1 TABLET BY MOUTH EVERY DAY 90 tablet 1  carvedilol  (COREG ) 6.25 MG tablet Take 1 tablet (6.25 mg total) by mouth 2 (two) times daily with a meal. 60 tablet 5   Evolocumab  (REPATHA  SURECLICK) 140 MG/ML SOAJ INJECT 140 MG INTO THE SKIN EVERY 14 (FOURTEEN) DAYS. 6 mL 3   BELSOMRA  15 MG TABS TAKE 1 TABLET BY MOUTH AT BEDTIME AS NEEDED. (Patient not taking: Reported on 08/09/2024) 30 tablet 0   HYDROcodone -acetaminophen  (NORCO/VICODIN) 5-325 MG tablet Take 1-2 tablets by mouth daily as needed. (Patient not taking: Reported on 08/09/2024) 14 tablet 0    No results found for this or any previous visit (from the past 48 hours). No results  found.  Review of Systems  Musculoskeletal:  Positive for back pain.  Neurological:  Positive for numbness.    Blood pressure (!) 141/87, pulse 66, temperature 98.7 F (37.1 C), temperature source Oral, resp. rate 16, SpO2 99%. Physical Exam HENT:     Head: Normocephalic.     Right Ear: Tympanic membrane normal.     Nose: Nose normal.  Cardiovascular:     Rate and Rhythm: Normal rate.     Pulses: Normal pulses.  Pulmonary:     Effort: Pulmonary effort is normal.  Musculoskeletal:        General: Normal range of motion.     Cervical back: Normal range of motion.  Neurological:     Mental Status: He is alert.     Comments: Strength is 5-5 iliopsoas, quads, hamstrings, gastrocs, and tibialis, and EHL.      Assessment/Plan 68 year old presents for decompressive laminectomies partial facetectomies and foraminotomies at L2-3 L3-4 L4-5  Arley SHAUNNA Helling, MD 08/13/2024, 9:37 AM

## 2024-08-13 NOTE — Transfer of Care (Signed)
 Immediate Anesthesia Transfer of Care Note  Patient: Cristian Gonzales  Procedure(s) Performed: LUMBAR LAMINECTOMY/DECOMPRESSION MICRODISCECTOMY 3 LEVELS (Back)  Patient Location: PACU  Anesthesia Type:General  Level of Consciousness: awake, oriented, and drowsy  Airway & Oxygen Therapy: Patient Spontanous Breathing  Post-op Assessment: Report given to RN, Post -op Vital signs reviewed and stable, and Patient moving all extremities  Post vital signs: Reviewed and stable  Last Vitals:  Vitals Value Taken Time  BP 153/91 08/13/24 12:26  Temp    Pulse 67 08/13/24 12:28  Resp 15 08/13/24 12:28  SpO2 95 % 08/13/24 12:28  Vitals shown include unfiled device data.  Last Pain:  Vitals:   08/13/24 0750  TempSrc: Oral  PainSc:          Complications: No notable events documented.

## 2024-08-13 NOTE — Plan of Care (Signed)

## 2024-08-13 NOTE — Anesthesia Procedure Notes (Signed)
 Procedure Name: Intubation Date/Time: 08/13/2024 9:59 AM  Performed by: Jerl Donald LABOR, CRNAPre-anesthesia Checklist: Patient identified, Emergency Drugs available, Suction available and Patient being monitored Patient Re-evaluated:Patient Re-evaluated prior to induction Oxygen Delivery Method: Circle System Utilized Preoxygenation: Pre-oxygenation with 100% oxygen Induction Type: IV induction Ventilation: Two handed mask ventilation required and Oral airway inserted - appropriate to patient size Laryngoscope Size: Glidescope and 3 Grade View: Grade I Tube type: Oral Tube size: 7.5 mm Number of attempts: 1 Airway Equipment and Method: Stylet and Oral airway Placement Confirmation: ETT inserted through vocal cords under direct vision, positive ETCO2 and breath sounds checked- equal and bilateral Secured at: 23 cm Tube secured with: Tape Dental Injury: Teeth and Oropharynx as per pre-operative assessment

## 2024-08-14 ENCOUNTER — Encounter (HOSPITAL_COMMUNITY): Payer: Self-pay | Admitting: Neurosurgery

## 2024-08-14 MED ORDER — CYCLOBENZAPRINE HCL 10 MG PO TABS: 10.0000 mg | ORAL_TABLET | Freq: Three times a day (TID) | ORAL | 0 refills | Status: AC | PRN

## 2024-08-14 MED ORDER — OXYCODONE-ACETAMINOPHEN 10-325 MG PO TABS: 1.0000 | ORAL_TABLET | Freq: Four times a day (QID) | ORAL | 0 refills | Status: AC | PRN

## 2024-08-14 MED FILL — Thrombin For Soln 5000 Unit: CUTANEOUS | Qty: 2 | Status: AC

## 2024-08-14 NOTE — Plan of Care (Signed)

## 2024-08-14 NOTE — Evaluation (Signed)
 Occupational Therapy Evaluation Patient Details Name: Cristian Gonzales MRN: [id number] DOB: 07/03/1956 Today's Date: 08/14/2024   History of Present Illness   68 yo M adm decompressive laminectomy L2-3 L3-4 L4-5 PMH: L TKA., siignificant for anxiety, arthritis, afib, CVA, R TKR, cervical discectomy.     Clinical Impressions Patient admitted for above and presents with problem list below.  PTA pt was independent with mobility, using cane as needed; needing assist for LB ADLs due to recent knee surgery. Patient was educated on back precautions, ADL compensatory techniques, AE/DME, mobility progression, safety and recommendations.  Today, pt demonstrated ability to complete bed mobility with supervision, transfers using RW with supervision, functional mobility using RW with supervision, and ADLs with up to min assist.  At discharge, pt will have support from spouse as needed.  Based on performance today, no further OT needs identified.  OT will sign off.       If plan is discharge home, recommend the following:   A little help with bathing/dressing/bathroom;A little help with walking and/or transfers;Assistance with cooking/housework;Assist for transportation;Help with stairs or ramp for entrance     Functional Status Assessment   Patient has had a recent decline in their functional status and demonstrates the ability to make significant improvements in function in a reasonable and predictable amount of time.     Equipment Recommendations   None recommended by OT     Recommendations for Other Services         Precautions/Restrictions   Precautions Precautions: Back Precaution Booklet Issued: Yes (comment) Recall of Precautions/Restrictions: Intact Precaution/Restrictions Comments: reviewed with pt Restrictions Weight Bearing Restrictions Per Provider Order: No     Mobility Bed Mobility Overal bed mobility: Needs Assistance Bed Mobility: Rolling, Sidelying to Sit,  Sit to Sidelying Rolling: Supervision Sidelying to sit: Supervision     Sit to sidelying: Supervision General bed mobility comments: cueing for technique but no physical assist required    Transfers Overall transfer level: Needs assistance Equipment used: Rolling walker (2 wheels) Transfers: Sit to/from Stand Sit to Stand: Supervision           General transfer comment: cueing for hand placement and safety      Balance Overall balance assessment: Mild deficits observed, not formally tested                                         ADL either performed or assessed with clinical judgement   ADL Overall ADL's : Needs assistance/impaired     Grooming: Supervision/safety;Standing           Upper Body Dressing : Set up;Sitting   Lower Body Dressing: Minimal assistance;Sit to/from stand   Toilet Transfer: Supervision/safety;Ambulation;Rolling walker (2 wheels)       Tub/ Shower Transfer: Supervision/safety;Walk-in shower;Ambulation;Rolling walker (2 wheels) Tub/Shower Transfer Details (indicate cue type and reason): simulated in room, pt voices understanding with technique Functional mobility during ADLs: Supervision/safety;Rolling walker (2 wheels);Cueing for sequencing;Cueing for safety       Vision   Vision Assessment?: No apparent visual deficits     Perception         Praxis         Pertinent Vitals/Pain Pain Assessment Pain Assessment: Faces Faces Pain Scale: Hurts little more Pain Location: back Pain Descriptors / Indicators: Discomfort, Operative site guarding Pain Intervention(s): Limited activity within patient's tolerance, Monitored during session, Repositioned  Extremity/Trunk Assessment Upper Extremity Assessment Upper Extremity Assessment: Overall WFL for tasks assessed   Lower Extremity Assessment Lower Extremity Assessment: Overall WFL for tasks assessed   Cervical / Trunk Assessment Cervical / Trunk  Assessment: Back Surgery   Communication Communication Communication: No apparent difficulties   Cognition Arousal: Alert Behavior During Therapy: WFL for tasks assessed/performed Cognition: No apparent impairments                               Following commands: Intact       Cueing  General Comments   Cueing Techniques: Verbal cues      Exercises     Shoulder Instructions      Home Living Family/patient expects to be discharged to:: Private residence Living Arrangements: Spouse/significant other Available Help at Discharge: Family Type of Home: House Home Access: Stairs to enter Secretary/administrator of Steps: 1 Entrance Stairs-Rails: None Home Layout: One level     Bathroom Shower/Tub: Chief Strategy Officer: Standard Bathroom Accessibility: No   Home Equipment: Cane - single point;Rollator (4 wheels)          Prior Functioning/Environment Prior Level of Function : Independent/Modified Independent;Driving             Mobility Comments: ind has cane as needed ADLs Comments: needing some assist for LB ADs, noted recent L TKA and spouse has been assisting    OT Problem List: Decreased activity tolerance;Pain;Decreased knowledge of precautions;Decreased knowledge of use of DME or AE   OT Treatment/Interventions:        OT Goals(Current goals can be found in the care plan section)   Acute Rehab OT Goals Patient Stated Goal: home OT Goal Formulation: With patient   OT Frequency:       Co-evaluation              AM-PAC OT 6 Clicks Daily Activity     Outcome Measure Help from another person eating meals?: None Help from another person taking care of personal grooming?: A Little Help from another person toileting, which includes using toliet, bedpan, or urinal?: A Little Help from another person bathing (including washing, rinsing, drying)?: A Little Help from another person to put on and taking off regular  upper body clothing?: A Little Help from another person to put on and taking off regular lower body clothing?: A Little 6 Click Score: 19   End of Session Equipment Utilized During Treatment: Rolling walker (2 wheels) Nurse Communication: Mobility status;Precautions  Activity Tolerance: Patient tolerated treatment well Patient left: in bed;with call bell/phone within reach  OT Visit Diagnosis: Other abnormalities of gait and mobility (R26.89);Pain Pain - part of body:  (back)                Time: 9160-9144 OT Time Calculation (min): 16 min Charges:  OT General Charges $OT Visit: 1 Visit OT Evaluation $OT Eval Low Complexity: 1 Low  Etta NOVAK, OT Acute Rehabilitation Services Office [phone] Secure Chat Preferred    Etta GORMAN Hope 08/14/2024, 10:08 AM

## 2024-08-14 NOTE — Discharge Instructions (Signed)
 Wound Care Keep incision covered and dry until post op day 3. You may remove the Honeycomb dressing on post op day 3. Leave steri-strips on back.  They will fall off by themselves. Do not put any creams, lotions, or ointments on incision. You are fine to shower. Let water run over incision and pat dry.   Activity Walk each and every day, increasing distance each day. No lifting greater than 8 lbs.  No lifting no bending no twisting no driving . 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   Diet Resume your normal diet.   Return to Work Will be discussed at your follow up appointment.  Call Your Doctor If Any of These Occur Redness, drainage, or swelling at the wound.  Temperature greater than 101 degrees. Severe pain not relieved by pain medication. Incision starts to come apart.  Follow Up Appt Call [phone] if you have one or any problem.,m

## 2024-08-14 NOTE — Discharge Summary (Signed)
 Physician Discharge Summary  Patient ID: Cristian Gonzales MRN: [id number] DOB/AGE: 05-01-1956 68 y.o. Estimated body mass index is 39.14 kg/m as calculated from the following:   Height as of 08/09/24: 5' 5 (1.651 m).   Weight as of 08/09/24: 106.7 kg.   Admit date: 08/13/2024 Discharge date: 08/14/2024  Admission Diagnoses: Lumbar spinal stenosis L2-L5  Discharge Diagnoses:  Principal Problem:   Spinal stenosis of lumbar region   Discharged Condition: good  Hospital Course: Patient was admitted to hospital underwent decompressive laminectomy L2-3 L3-4 L4-5.  Postoperatively patient did very well, with full fluids and voiding spontaneously tolerating regular diet stable for discharge home.  Patient will be discharged with scheduled follow-up in 1 to 2 weeks.  Consults: Significant Diagnostic Studies: Treatments: Decompressive laminectomy L2-3 L3-4 L4-5 Discharge Exam: Blood pressure 127/69, pulse 67, temperature 98 F (36.7 C), temperature source Oral, resp. rate 20, SpO2 100%. Strength intact  Disposition: Home   Allergies as of 08/14/2024       Reactions   Tramadol  Other (See Comments)   Passed out   Metoprolol  Other (See Comments)    Made my head feel like it was going to blow up    Morphine Other (See Comments)   Left red bumps on the back and did not work   Trazodone  And Nefazodone Other (See Comments)   Made the patient feel flushed,with blurred vision and muscle cramps         Medication List     STOP taking these medications    acetaminophen  500 MG tablet Commonly known as: TYLENOL    Belsomra  15 MG Tabs Generic drug: Suvorexant    buPROPion  150 MG 24 hr tablet Commonly known as: WELLBUTRIN  XL   carvedilol  6.25 MG tablet Commonly known as: COREG    Eliquis  5 MG Tabs tablet Generic drug: apixaban    HYDROcodone -acetaminophen  5-325 MG tablet Commonly known as: NORCO/VICODIN   Repatha  SureClick 140 MG/ML Soaj Generic drug: Evolocumab         TAKE these medications    cyclobenzaprine  10 MG tablet Commonly known as: FLEXERIL  Take 1 tablet (10 mg total) by mouth 3 (three) times daily as needed for muscle spasms.   oxyCODONE -acetaminophen  10-325 MG tablet Commonly known as: Percocet Take 1 tablet by mouth every 6 (six) hours as needed for pain.         Signed: Arley SHAUNNA Helling 08/14/2024, 7:54 AM

## 2024-08-14 NOTE — Progress Notes (Signed)
 Patient awaiting family for discharge home, Patient in no acute distress nor complaints of pain nor discomfort; incision on back is clean, dry and intact; No c/o pain at this time. Room was checked and accounted for all patient's belongings; discharge instructions concerning her medications, incision care, follow up appointment and when to call the doctor as needed were all discussed with patient and wife by RN and they expressed understanding on the instructions given.

## 2024-09-02 NOTE — Progress Notes (Unsigned)
 " Cardiology Office Note:    Date:  09/04/2024   ID:  Cristian Gonzales, Cristian Gonzales 1956/01/20, MRN [id number]  PCP:  Merna Huxley, NP  Cardiologist:  Lonni LITTIE Nanas, MD  Electrophysiologist:  None   Referring MD: Merna Huxley, NP   Chief Complaint  Patient presents with   Atrial Fibrillation    History of Present Illness:    Cristian Gonzales is a 68 y.o. male with a hx of atrial fibrillation, CVA, chronic diastolic heart failure, hyperlipidemia who presents for follow-up.  Found have acute CVA 03/2021 and underwent loop recorder placement.  Presented to ED 02/2023 with chest pain, was found to be in atrial flutter with rate 130 bpm.  He converted spontaneously to sinus rhythm.  Started on Eliquis  and metoprolol  at that time.  Underwent back surgery 08/2024.  He reports has been feeling very tired since that time. Denies any chest pain, dyspnea, or palpitations.  Does report some lightheadedness denies any syncope.  Reports some swelling in left leg.    Wt Readings from Last 3 Encounters:  09/04/24 234 lb (106.1 kg)  08/09/24 235 lb 3.2 oz (106.7 kg)  06/14/24 225 lb (102.1 kg)     Past Medical History:  Diagnosis Date   Anxiety    Arthritis    Cervical spine fracture (HCC)    13 -diving accident, no neurolgic sequelae   Depression    Paroxysmal atrial fibrillation (HCC)    Pneumonia    x several   Stroke (HCC)    Syncope 11/27/2012   From Tramadol     Past Surgical History:  Procedure Laterality Date   BUBBLE STUDY  03/19/2021   Procedure: BUBBLE STUDY;  Surgeon: Delford Maude BROCKS, MD;  Location: [facility] ENDOSCOPY;  Service: Cardiovascular;;   CERVICAL DISCECTOMY  09/06/2002   dr gaither   EYE SURGERY Bilateral    cataracts removed ([facility])   KNEE SURGERY  09/07/1979   right   LOOP RECORDER INSERTION N/A 03/19/2021   Procedure: LOOP RECORDER INSERTION;  Surgeon: Cindie Ole DASEN, MD;  Location: [facility] INVASIVE CV LAB;  Service: Cardiovascular;  Laterality:  N/A;   LUMBAR LAMINECTOMY/DECOMPRESSION MICRODISCECTOMY N/A 07/27/2023   Procedure: Thoracic Eleven-Twelve Decompression;  Surgeon: Onetha Kuba, MD;  Location: [facility] OR;  Service: Neurosurgery;  Laterality: N/A;   LUMBAR LAMINECTOMY/DECOMPRESSION MICRODISCECTOMY N/A 08/13/2024   Procedure: LUMBAR LAMINECTOMY/DECOMPRESSION MICRODISCECTOMY 3 LEVELS;  Surgeon: Onetha Kuba, MD;  Location: [facility] OR;  Service: Neurosurgery;  Laterality: N/A;  Laminectomy and Foraminotomy - L2-L3 - L3-L4 - L4-L5   NECK SURGERY     REPLACEMENT TOTAL KNEE     right   REPLACEMENT TOTAL KNEE Left    TEE WITHOUT CARDIOVERSION N/A 03/19/2021   Procedure: TRANSESOPHAGEAL ECHOCARDIOGRAM (TEE);  Surgeon: Delford Maude BROCKS, MD;  Location: [facility] ENDOSCOPY;  Service: Cardiovascular;  Laterality: N/A;   TOTAL KNEE ARTHROPLASTY Left 05/14/2024   Procedure: ARTHROPLASTY, KNEE, TOTAL;  Surgeon: Jerri Kay CHRISTELLA, MD;  Location: [facility] OR;  Service: Orthopedics;  Laterality: Left;    Current Medications: Current Meds  Medication Sig   apixaban  (ELIQUIS ) 5 MG TABS tablet Take 5 mg by mouth 2 (two) times daily.   carvedilol  (COREG ) 6.25 MG tablet Take 6.25 mg by mouth 2 (two) times daily with a meal.   Evolocumab  (REPATHA  SURECLICK) 140 MG/ML SOAJ Inject 140 mg into the skin every 14 (fourteen) days.     Allergies:   Tramadol , Metoprolol , Morphine, and Trazodone  and nefazodone   Social History  Socioeconomic History   Marital status: Married    Spouse name: Not on file   Number of children: Not on file   Years of education: Not on file   Highest education level: Not on file  Occupational History   Not on file  Tobacco Use   Smoking status: Former    Types: Cigars   Smokeless tobacco: Never   Tobacco comments:    Smokes 1 cigar twice weekly.  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    Married for 25 years    5 children ( 3 in [place] and 2 in [place])   Social Drivers of Health   Tobacco Use: Medium Risk (09/04/2024)   Patient History    Smoking Tobacco Use: Former    Smokeless Tobacco Use: Never    Passive Exposure: Not on Actuary Strain: Low Risk (10/03/2023)   Overall Financial Resource Strain (CARDIA)    Difficulty of Paying Living Expenses: Not hard at all  Food Insecurity: No Food Insecurity (10/03/2023)   Hunger Vital Sign    Worried About Running Out of Food in the Last Year: Never true    Ran Out of Food in the Last Year: Never true  Transportation Needs: No Transportation Needs (10/03/2023)   PRAPARE - Administrator, Civil Service (Medical): No    Lack of Transportation (Non-Medical): No  Physical Activity: Sufficiently Active (10/03/2023)   Exercise Vital Sign    Days of Exercise per Week: 5 days    Minutes of Exercise per Session: 30 min  Stress: No Stress Concern Present (10/03/2023)   [employer] of Occupational Health - Occupational Stress Questionnaire    Feeling of Stress : Not at all  Social Connections: Moderately Integrated (10/03/2023)   Social Connection and Isolation Panel    Frequency of Communication with Friends and Family: More than three times a week    Frequency of Social Gatherings with Friends and Family: More than three times a week    Attends Religious Services: Never    Database Administrator or Organizations: Yes    Attends Banker Meetings: More than 4 times per year    Marital Status: Married  Depression (PHQ2-9): Low Risk (10/03/2023)   Depression (PHQ2-9)    PHQ-2 Score: 0  Alcohol Screen: Low Risk (10/03/2023)   Alcohol Screen    Last Alcohol Screening Score (AUDIT): 0   Housing: Unknown (10/03/2023)   Housing Stability Vital Sign    Unable to Pay for Housing in the Last Year: No    Number of Times Moved in the Last Year: Not on file    Homeless in the Last Year: No  Utilities: Not At Risk (10/03/2023)   AHC Utilities    Threatened with loss of utilities: No  Health Literacy: Adequate Health Literacy (10/03/2023)   B1300 Health Literacy    Frequency of need for help with medical instructions: Never     Family History: The patient's family history includes Heart attack (age of onset: 58) in his father;  Liver cancer (age of onset: 76) in his mother.  ROS:   Please see the history of present illness.     All other systems reviewed and are negative.  EKGs/Labs/Other Studies Reviewed:    The following studies were reviewed today:   EKG:   07/24/2023: Normal sinus rhythm, rate 90, left axis deviation, Q waves in leads III, aVF and V3/4 02/27/2024: Sinus rhythm with first-degree AV block, left axis deviation, rate 64, poor R wave progression, Q waves in leads III, aVF  Recent Labs: 08/09/2024: BUN 19; Creatinine, Ser 1.13; Hemoglobin 14.3; Platelets 339; Potassium 4.2; Sodium 139  Recent Lipid Panel    Component Value Date/Time   CHOL 120 02/27/2024 1049   TRIG 105 02/27/2024 1049   TRIG 72 06/21/2006 1046   HDL 45 02/27/2024 1049   CHOLHDL 2.7 02/27/2024 1049   CHOLHDL 3 01/06/2022 1105   VLDL 18.4 01/06/2022 1105   LDLCALC 56 02/27/2024 1049   LDLDIRECT 161.7 10/31/2009 0926    Physical Exam:    VS:  BP 130/86 (BP Location: Left Arm, Patient Position: Sitting, Cuff Size: Normal)   Pulse 80   Ht 5' 5 (1.651 m)   Wt 234 lb (106.1 kg)   SpO2 96%   BMI 38.94 kg/m     Wt Readings from Last 3 Encounters:  09/04/24 234 lb (106.1 kg)  08/09/24 235 lb 3.2 oz (106.7 kg)  06/14/24 225 lb (102.1 kg)     GEN:  Well nourished, well developed in no acute distress HEENT: Normal NECK: No JVD; No carotid bruits LYMPHATICS: No  lymphadenopathy CARDIAC: RRR, no murmurs, rubs, gallops RESPIRATORY:  Clear to auscultation without rales, wheezing or rhonchi  ABDOMEN: Soft, non-tender, non-distended MUSCULOSKELETAL:  No edema; No deformity  SKIN: Warm and dry NEUROLOGIC:  Alert and oriented x 3 PSYCHIATRIC:  Normal affect   ASSESSMENT:    1. PAF (paroxysmal atrial fibrillation) (HCC)   2. Essential hypertension   3. Mixed hyperlipidemia   4. OSA (obstructive sleep apnea)      PLAN:    Paroxysmal atrial fibrillation: Diagnosed on ED visit 02/2023.  CHA2DS2-VASc 3 (age, CVA x 2).  Echocardiogram 02/07/2023 showed EF 60 to 65%, normal RV function, no significant valvular disease. -Continue Eliquis  5 mg twice daily -Continue carvedilol  6.25 mg twice daily  Hypertension: On carvedilol  6.25 mg twice daily.  Appears controlled  CVA: Admitted 03/2021 with acute CVA.  TEE 03/2021 showed EF 55%, small PFO.  Has a loop recorder.  Found to have atrial fibrillation/flutter as above.  On Eliquis .  Has been unable to tolerate statins, referred to pharmacy lipid clinic and started on Repatha   Hyperlipidemia: Reports intolerance to multiple statins due to myalgias.  LDL 178 on 08/2023.  Referred to pharmacy lipid clinic and started on Repatha .  LDL 56 on 02/27/2024  Morbid obesity: Body mass index is 38.94 kg/m.  Diet/exercise encouraged.  Discussed referral to healthy weight and wellness but he declined  OSA: Moderate OSA on sleep study 02/2024, reports did not start CPAP.  He is reporting significant fatigue, suspect OSA contributing.  Recommend referral to sleep medicine to start CPAP   RTC in 6 months  Medication Adjustments/Labs and Tests Ordered: Current medicines are reviewed at length with the patient today.  Concerns regarding medicines are outlined above.  Orders Placed This Encounter  Procedures   Ambulatory referral to Pulmonology   No orders of the defined types were placed in this encounter.   Patient  Instructions  Medication Instructions:  Your physician recommends that you continue on your current medications as directed. Please refer to the Current Medication list given to you today. *If you need a refill on your cardiac medications before your next appointment, please call your pharmacy*  Lab Work: None ordered If you have labs (blood work) drawn today and your tests are completely normal, you will receive your results only by: MyChart Message (if you have MyChart) OR A paper copy in the mail If you have any lab test that is abnormal or we need to change your treatment, we will call you to review the results.  Testing/Procedures: None ordered  Follow-Up: At [facility], you and your health needs are our priority.  As part of our continuing mission to provide you with exceptional heart care, our providers are all part of one team.  This team includes your primary Cardiologist (physician) and Advanced Practice Providers or APPs (Physician Assistants and Nurse Practitioners) who all work together to provide you with the care you need, when you need it.  Your next appointment:   6 month(s)  Provider:   Lonni LITTIE Nanas, MD    We recommend signing up for the patient portal called MyChart.  Sign up information is provided on this After Visit Summary.  MyChart is used to connect with patients for Virtual Visits (Telemedicine).  Patients are able to view lab/test results, encounter notes, upcoming appointments, etc.  Non-urgent messages can be sent to your provider as well.   To learn more about what you can do with MyChart, go to [email or web address].   Other Instructions  You have been referred to [facility] Pulmonology.           Signed, Lonni LITTIE Nanas, MD  09/04/2024 12:16 PM    [facility] "

## 2024-09-04 ENCOUNTER — Ambulatory Visit: Attending: Cardiology | Admitting: Cardiology

## 2024-09-04 ENCOUNTER — Encounter: Payer: Self-pay | Admitting: Cardiology

## 2024-09-04 VITALS — BP 130/86 | HR 80 | Ht 65.0 in | Wt 234.0 lb

## 2024-09-04 DIAGNOSIS — I480 Paroxysmal atrial fibrillation: Secondary | ICD-10-CM

## 2024-09-04 DIAGNOSIS — G4733 Obstructive sleep apnea (adult) (pediatric): Secondary | ICD-10-CM

## 2024-09-04 DIAGNOSIS — E782 Mixed hyperlipidemia: Secondary | ICD-10-CM

## 2024-09-04 DIAGNOSIS — I10 Essential (primary) hypertension: Secondary | ICD-10-CM

## 2024-09-04 NOTE — Patient Instructions (Signed)
 Medication Instructions:  Your physician recommends that you continue on your current medications as directed. Please refer to the Current Medication list given to you today. *If you need a refill on your cardiac medications before your next appointment, please call your pharmacy*  Lab Work: None ordered If you have labs (blood work) drawn today and your tests are completely normal, you will receive your results only by: MyChart Message (if you have MyChart) OR A paper copy in the mail If you have any lab test that is abnormal or we need to change your treatment, we will call you to review the results.  Testing/Procedures: None ordered  Follow-Up: At [facility], you and your health needs are our priority.  As part of our continuing mission to provide you with exceptional heart care, our providers are all part of one team.  This team includes your primary Cardiologist (physician) and Advanced Practice Providers or APPs (Physician Assistants and Nurse Practitioners) who all work together to provide you with the care you need, when you need it.  Your next appointment:   6 month(s)  Provider:   Lonni LITTIE Nanas, MD    We recommend signing up for the patient portal called MyChart.  Sign up information is provided on this After Visit Summary.  MyChart is used to connect with patients for Virtual Visits (Telemedicine).  Patients are able to view lab/test results, encounter notes, upcoming appointments, etc.  Non-urgent messages can be sent to your provider as well.   To learn more about what you can do with MyChart, go to [email or web address].   Other Instructions  You have been referred to [facility] Pulmonology.

## 2024-09-09 ENCOUNTER — Ambulatory Visit: Attending: Adult Health

## 2024-09-10 ENCOUNTER — Encounter

## 2024-09-20 ENCOUNTER — Ambulatory Visit: Admitting: Physician Assistant

## 2024-09-25 ENCOUNTER — Ambulatory Visit: Admitting: Physician Assistant

## 2024-09-27 ENCOUNTER — Telehealth: Payer: Self-pay | Admitting: Cardiology

## 2024-09-27 NOTE — Telephone Encounter (Signed)
 Pt c/o medication issue:  1. Name of Medication: apixaban  (ELIQUIS ) 5 MG TABS tablet   2. How are you currently taking this medication (dosage and times per day)? As written  3. Are you having a reaction (difficulty breathing--STAT)? no  4. What is your medication issue? Pt would like a c/b regarding grant for above medication. Pt states that this time he being charged $500. Pt would like to know if grant is still applied. Please advise

## 2024-09-28 ENCOUNTER — Telehealth: Payer: Self-pay | Admitting: Pharmacy Technician

## 2024-09-28 ENCOUNTER — Other Ambulatory Visit (HOSPITAL_COMMUNITY): Payer: Self-pay

## 2024-09-28 NOTE — Telephone Encounter (Signed)
 SABRA

## 2024-10-01 ENCOUNTER — Telehealth: Payer: Self-pay | Admitting: Pharmacy Technician

## 2024-10-01 ENCOUNTER — Other Ambulatory Visit (HOSPITAL_COMMUNITY): Payer: Self-pay

## 2024-10-01 MED ORDER — REPATHA SURECLICK 140 MG/ML ~~LOC~~ SOAJ
140.0000 mg | SUBCUTANEOUS | 3 refills | Status: AC
  Filled 2024-10-01: qty 6, 84d supply, fill #0

## 2024-10-01 NOTE — Telephone Encounter (Signed)
 Will send Eliquis  once Repatha  has been filled

## 2024-10-01 NOTE — Telephone Encounter (Signed)
 Once he fills the Repatha  with his [employer]well grant, his deductible will be paid and his Eliquis  will be $47/month.  I called and explained this to patient. He was very adult nurse. He has enough Eliquis  for 1 month. He is due on the 14th for Repatha . He would like to get both Rx at [facility]. Will need to renew his [employer] and add to [facility]

## 2024-10-01 NOTE — Addendum Note (Signed)
 Addended by: Takeyah Wieman D on: 10/01/2024 04:36 PM   Modules accepted: Orders

## 2024-10-01 NOTE — Telephone Encounter (Signed)
 Patient Advocate Encounter   The patient was approved for a [employer] grant that will help cover the cost of repatha  Total amount awarded, 2500.  Effective: 09/04/24 - 09/03/25   APW:[id number] ERW:[id number] Hmnle:[id number] PI:[id number] [employer] ID: [id number]   Pharmacy provided with approval and processing information. Patient informed via mychart

## 2024-10-02 ENCOUNTER — Ambulatory Visit: Admitting: Physician Assistant

## 2024-10-02 ENCOUNTER — Other Ambulatory Visit: Payer: Self-pay | Admitting: Cardiology

## 2024-10-02 ENCOUNTER — Other Ambulatory Visit (HOSPITAL_COMMUNITY): Payer: Self-pay

## 2024-10-02 ENCOUNTER — Other Ambulatory Visit: Payer: Self-pay

## 2024-10-02 DIAGNOSIS — Z96652 Presence of left artificial knee joint: Secondary | ICD-10-CM

## 2024-10-02 MED ORDER — HYDROCODONE-ACETAMINOPHEN 5-325 MG PO TABS
1.0000 | ORAL_TABLET | Freq: Every day | ORAL | 0 refills | Status: AC | PRN
  Filled 2024-10-02: qty 10, 10d supply, fill #0

## 2024-10-02 NOTE — Progress Notes (Signed)
 "  Post-Op Visit Note   Patient: Cristian Gonzales           Date of Birth: 1956/08/13           MRN: [id number] Visit Date: 10/02/2024 PCP: Merna Huxley, NP   Assessment & Plan:  Chief Complaint:  Chief Complaint  Patient presents with   Left Knee - Follow-up    Left TKA 05/14/2024   Visit Diagnoses:  1. Status post total left knee replacement     Plan: Patient is a pleasant 69 year old gentleman who comes in today approximately 4-1/2 months status post left total knee replacement.  He has been doing well overall but does note a fair amount of swelling with increased activity such as when he is walking approximately quarter of a mile.  The swelling does seem to cause some discomfort.  He has been taking Tylenol  without significant relief.  He is on Eliquis  was unable to take NSAIDs.  Examination left knee reveals a small effusion.  Range of motion 0 to 110 degrees.  No joint line tenderness.  He is stable valgus varus stress.  He is neurovascular intact distally.  This point, discussed backing off activity and gradually building up as tolerated.  We have discussed that the swelling can persist with increased activity for up to a year or so after surgery.  He will follow-up with us  in a few months when he is 6 months out from surgery.  Call with concerns or questions in the meantime.  Follow-Up Instructions: Return in about 2 months (around 11/30/2024).   Orders:  Orders Placed This Encounter  Procedures   XR Knee 1-2 Views Left   Meds ordered this encounter  Medications   HYDROcodone -acetaminophen  (NORCO/VICODIN) 5-325 MG tablet    Sig: Take 1 tablet by mouth daily as needed.    Dispense:  10 tablet    Refill:  0    Imaging: XR Knee 1-2 Views Left Result Date: 10/02/2024 Well-seated prosthesis without complication   PMFS History: Patient Active Problem List   Diagnosis Date Noted   Status post total left knee replacement 05/14/2024   Primary osteoarthritis of left knee  02/07/2024   Body mass index 40.0-44.9, adult (HCC) 02/07/2024   Myelopathy (HCC) 07/27/2023   Spinal stenosis of lumbar region 07/25/2023   Cord compression myelopathy (HCC) 07/25/2023   Paroxysmal atrial fibrillation (HCC) 07/25/2023   Essential hypertension 07/25/2023   Obesity (BMI 30-39.9) 07/25/2023   Atrial fibrillation with RVR (HCC) 02/07/2023   Elevated troponin 02/07/2023   Hypokalemia 02/07/2023   SIRS (systemic inflammatory response syndrome) (HCC) 02/07/2023   Pulmonary nodule 02/07/2023   Chronic diastolic CHF (congestive heart failure) (HCC) 02/07/2023   Chest pain 02/06/2023   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   Hyperlipidemia 07/30/2015   Lumbar disc disease with radiculopathy 07/29/2014   Left-sided thoracic back pain 06/05/2014   Numbness and tingling sensation of skin 06/25/2013   Syncope 11/27/2012   Depression 11/27/2012   Epididymitis, left 11/23/2010   Allergic rhinitis 11/07/2009   Extrinsic asthma 02/05/2008   Anxiety state 08/08/2007   Past Medical History:  Diagnosis Date   Anxiety    Arthritis    Cervical spine fracture (HCC)    13 -diving accident, no neurolgic sequelae   Depression    Paroxysmal atrial fibrillation (HCC)    Pneumonia    x several   Stroke (HCC)    Syncope 11/27/2012   From Tramadol     Family History  Problem Relation Age  of Onset   Heart attack Father 3       Sudden Cardiac Death   Liver cancer Mother 58    Past Surgical History:  Procedure Laterality Date   BUBBLE STUDY  03/19/2021   Procedure: BUBBLE STUDY;  Surgeon: Delford Maude BROCKS, MD;  Location: [facility] ENDOSCOPY;  Service: Cardiovascular;;   CERVICAL DISCECTOMY  09/06/2002   dr gaither   EYE SURGERY Bilateral    cataracts removed ([facility])   KNEE SURGERY  09/07/1979   right   LOOP RECORDER INSERTION N/A 03/19/2021   Procedure: LOOP RECORDER INSERTION;  Surgeon: Cindie Ole DASEN, MD;  Location: [facility] INVASIVE CV LAB;  Service: Cardiovascular;   Laterality: N/A;   LUMBAR LAMINECTOMY/DECOMPRESSION MICRODISCECTOMY N/A 07/27/2023   Procedure: Thoracic Eleven-Twelve Decompression;  Surgeon: Onetha Kuba, MD;  Location: [facility] OR;  Service: Neurosurgery;  Laterality: N/A;   LUMBAR LAMINECTOMY/DECOMPRESSION MICRODISCECTOMY N/A 08/13/2024   Procedure: LUMBAR LAMINECTOMY/DECOMPRESSION MICRODISCECTOMY 3 LEVELS;  Surgeon: Onetha Kuba, MD;  Location: [facility] OR;  Service: Neurosurgery;  Laterality: N/A;  Laminectomy and Foraminotomy - L2-L3 - L3-L4 - L4-L5   NECK SURGERY     REPLACEMENT TOTAL KNEE     right   REPLACEMENT TOTAL KNEE Left    TEE WITHOUT CARDIOVERSION N/A 03/19/2021   Procedure: TRANSESOPHAGEAL ECHOCARDIOGRAM (TEE);  Surgeon: Delford Maude BROCKS, MD;  Location: [facility] ENDOSCOPY;  Service: Cardiovascular;  Laterality: N/A;   TOTAL KNEE ARTHROPLASTY Left 05/14/2024   Procedure: ARTHROPLASTY, KNEE, TOTAL;  Surgeon: Jerri Kay HERO, MD;  Location: [facility] OR;  Service: Orthopedics;  Laterality: Left;   Social History   Occupational History   Not on file  Tobacco Use   Smoking status: Former    Types: Cigars   Smokeless tobacco: Never   Tobacco comments:    Smokes 1 cigar twice weekly.  Vaping Use   Vaping status: Never Used  Substance and Sexual Activity   Alcohol use: Yes    Comment: occas   Drug use: Not Currently    Comment: Marijuana w/THC drinks taken qhs to help with sleep; informed to withhold 24 hrs prior to procedure   Sexual activity: Not Currently     "

## 2024-10-03 ENCOUNTER — Ambulatory Visit: Admitting: Primary Care

## 2024-10-04 ENCOUNTER — Other Ambulatory Visit (HOSPITAL_COMMUNITY): Payer: Self-pay

## 2024-10-04 MED ORDER — APIXABAN 5 MG PO TABS
5.0000 mg | ORAL_TABLET | Freq: Two times a day (BID) | ORAL | 5 refills | Status: AC
  Filled 2024-10-04: qty 60, 30d supply, fill #0

## 2024-10-10 ENCOUNTER — Ambulatory Visit

## 2024-10-11 ENCOUNTER — Encounter

## 2024-10-12 ENCOUNTER — Telehealth: Payer: Self-pay | Admitting: Cardiology

## 2024-10-12 NOTE — Telephone Encounter (Signed)
 Called re missed remote

## 2024-11-06 ENCOUNTER — Ambulatory Visit: Admitting: Orthopaedic Surgery

## 2024-11-10 ENCOUNTER — Ambulatory Visit

## 2024-11-12 ENCOUNTER — Encounter

## 2024-12-11 ENCOUNTER — Ambulatory Visit

## 2024-12-13 ENCOUNTER — Encounter

## 2025-01-14 ENCOUNTER — Encounter
